# Patient Record
Sex: Female | Born: 1955 | Race: Black or African American | Hispanic: No | Marital: Married | Smoking: Never smoker
Health system: Southern US, Community
[De-identification: ages and names within clinical notes are randomized; demographics above are authoritative.]

## PROBLEM LIST (undated history)

## (undated) DIAGNOSIS — R748 Abnormal levels of other serum enzymes: Secondary | ICD-10-CM

## (undated) DIAGNOSIS — E221 Hyperprolactinemia: Secondary | ICD-10-CM

## (undated) DIAGNOSIS — N63 Unspecified lump in unspecified breast: Principal | ICD-10-CM

## (undated) DIAGNOSIS — R001 Bradycardia, unspecified: Secondary | ICD-10-CM

## (undated) DIAGNOSIS — K219 Gastro-esophageal reflux disease without esophagitis: Secondary | ICD-10-CM

## (undated) HISTORY — DX: Hyperprolactinemia: E22.1

## (undated) HISTORY — PX: BREAST EXCISIONAL BIOPSY: SUR124

## (undated) HISTORY — DX: Bradycardia, unspecified: R00.1

## (undated) HISTORY — DX: Unspecified lump in unspecified breast: N63.0

## (undated) HISTORY — PX: BUNIONECTOMY: SHX129

---

## 1994-04-24 HISTORY — PX: TUBAL LIGATION: SHX77

## 2001-07-17 ENCOUNTER — Other Ambulatory Visit: Admission: RE | Admit: 2001-07-17 | Discharge: 2001-07-17 | Payer: Self-pay | Admitting: Family Medicine

## 2004-04-29 ENCOUNTER — Ambulatory Visit: Payer: Self-pay | Admitting: Family Medicine

## 2006-08-02 ENCOUNTER — Ambulatory Visit: Payer: Self-pay | Admitting: Family Medicine

## 2006-08-30 ENCOUNTER — Ambulatory Visit (HOSPITAL_COMMUNITY): Admission: RE | Admit: 2006-08-30 | Discharge: 2006-08-30 | Payer: Self-pay | Admitting: Otolaryngology

## 2007-09-17 ENCOUNTER — Ambulatory Visit: Payer: Self-pay

## 2007-09-17 LAB — HM MAMMOGRAPHY: HM MAMMO: NORMAL

## 2007-10-30 ENCOUNTER — Encounter: Admission: RE | Admit: 2007-10-30 | Discharge: 2007-10-30 | Payer: Self-pay | Admitting: *Deleted

## 2007-11-22 ENCOUNTER — Encounter: Admission: RE | Admit: 2007-11-22 | Discharge: 2007-11-22 | Payer: Self-pay | Admitting: Surgery

## 2008-12-21 ENCOUNTER — Ambulatory Visit: Payer: Self-pay | Admitting: Unknown Physician Specialty

## 2010-02-14 ENCOUNTER — Encounter: Payer: Self-pay | Admitting: Cardiology

## 2010-06-23 ENCOUNTER — Encounter: Payer: Self-pay | Admitting: Cardiology

## 2010-06-23 ENCOUNTER — Emergency Department: Payer: Self-pay | Admitting: Emergency Medicine

## 2010-06-30 ENCOUNTER — Encounter: Payer: Self-pay | Admitting: Cardiology

## 2010-06-30 ENCOUNTER — Ambulatory Visit (INDEPENDENT_AMBULATORY_CARE_PROVIDER_SITE_OTHER): Payer: PRIVATE HEALTH INSURANCE | Admitting: Cardiology

## 2010-06-30 DIAGNOSIS — R9431 Abnormal electrocardiogram [ECG] [EKG]: Secondary | ICD-10-CM | POA: Insufficient documentation

## 2010-06-30 DIAGNOSIS — R011 Cardiac murmur, unspecified: Secondary | ICD-10-CM

## 2010-06-30 DIAGNOSIS — R0602 Shortness of breath: Secondary | ICD-10-CM | POA: Insufficient documentation

## 2010-06-30 DIAGNOSIS — R079 Chest pain, unspecified: Secondary | ICD-10-CM | POA: Insufficient documentation

## 2010-07-01 ENCOUNTER — Telehealth (INDEPENDENT_AMBULATORY_CARE_PROVIDER_SITE_OTHER): Payer: Self-pay | Admitting: *Deleted

## 2010-07-04 ENCOUNTER — Encounter: Payer: Self-pay | Admitting: Cardiology

## 2010-07-04 ENCOUNTER — Ambulatory Visit (HOSPITAL_COMMUNITY): Payer: PRIVATE HEALTH INSURANCE | Attending: Cardiology

## 2010-07-04 DIAGNOSIS — R0602 Shortness of breath: Secondary | ICD-10-CM

## 2010-07-04 DIAGNOSIS — R0609 Other forms of dyspnea: Secondary | ICD-10-CM

## 2010-07-04 DIAGNOSIS — R9431 Abnormal electrocardiogram [ECG] [EKG]: Secondary | ICD-10-CM | POA: Insufficient documentation

## 2010-07-04 DIAGNOSIS — R079 Chest pain, unspecified: Secondary | ICD-10-CM | POA: Insufficient documentation

## 2010-07-04 DIAGNOSIS — R0682 Tachypnea, not elsewhere classified: Secondary | ICD-10-CM | POA: Insufficient documentation

## 2010-07-04 DIAGNOSIS — R0989 Other specified symptoms and signs involving the circulatory and respiratory systems: Secondary | ICD-10-CM

## 2010-07-05 NOTE — Assessment & Plan Note (Signed)
Summary: [facility] f/u;Atypical CP/AMD   Visit Type:  Initial Consult Primary Provider:  Dr. Elease Hashimoto  CC:  Establish care; went to [facility] from her PCP with  abnormal EKG.   She has chest pain at rest with shortness of breath..  History of Present Illness: 55 yo with minimal past history presents for evaluation of chest pain.  Chest pain began about 10 days ago, initially while she was driving.  It felt like an aching in the central left chest.  It was not pleuritic.  On and off it radiated up the neck and down the left arm.   The pain resolved in about 30 minutes but came back periodically over the next 2 days, lasting about 15-30 minutes at a time.  The pain was never exertional.  About a week ago, she had a severe episode of the chest pain and noted shortness of breath climbing a flight of steps.  She went to her PCP's office where an ECG was done, which was abnormal, showing anterior shallow T wave inversions.  She was sent to the ER.  Workup in the ER was unremarkable with normal cardiac enzymes, normal D dimer, and normal CXR.  Since that time, she has had minimal chest pain.  She has only had brief (seconds in duration) twinges of pain.  She continues to exercise regularly with no problems. She was in a vigorous spin class 2 days ago with no exertional symptoms.  Of note, HR is 49 today.  She says that her pulse tends to run low and has done so for a long time (in the 50s typically).    ECG: NSR, HR 49, LAFB, inferior and anterior shallow T wave inversions  Labs (3/12, ER): D dimer normal, K 3.6, creatinine 0.87, troponin negative, HCT 36  Preventive Screening-Counseling & Management  Alcohol-Tobacco     Smoking Status: never  Caffeine-Diet-Exercise     Does Patient Exercise: yes      Drug Use:  no.    Current Medications (verified): 1)  Aspirin 81 Mg Tabs (Aspirin) .... Four Tablets Once Daily 2)  Fish Oil 1000 Mg Caps (Omega-3 Fatty Acids) .... One Tablet Once Daily 3)  Vitamin D3 2000  Unit Caps (Cholecalciferol) .... One Tablet Once Daily 4)  Centrum Silver  Tabs (Multiple Vitamins-Minerals) .... One Tablet Once Daily  Allergies (verified): No Known Drug Allergies  Past History:  Family History: Last updated: 2010-07-03 Father:Deceased; Diabetes Mother:Deceased; cancer & TIA's, also history of "angina" but no known MI or coronary procedure.  Grandmother: ? sudden cardiac death  Social History: Last updated: 07/03/10 Works as Agricultural engineer, also in school Married, husband is Engineer, civil (consulting) on 2000 floor at [employer] Tobacco Use - No.  Alcohol Use - no Regular Exercise - yes- works out 3 days weekly. Drug Use - no  Risk Factors: Exercise: yes (07-03-10)  Risk Factors: Smoking Status: never (2010/07/03)  Past Medical History: 1. Vitamin D deficiency 2. Tubal ligation 3.  Bradycardia (mild) 4.  Chest pain  Past Surgical History: tubligation  Family History: Father:Deceased; Diabetes Mother:Deceased; cancer & TIA's, also history of "angina" but no known MI or coronary procedure.  Grandmother: ? 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  Alcohol Use - no Regular Exercise - yes- works out 3 days weekly. Drug Use - no Smoking Status:  never Does Patient Exercise:  yes Drug Use:  no  Review of Systems       All systems reviewed and negative except as per HPI.   Vital Signs:  Patient profile:   55 year old female Height:      62 inches Weight:      187 pounds BMI:     34.33 Pulse rate:   49 / minute BP sitting:   112 / 79  (left arm) Cuff size:   regular  Vitals Entered By: Bishop Dublin, CMA (June 30, 2010 10:25 AM)  Physical Exam  General:  Well developed, well nourished, in no acute distress.  Overweight.  Head:  normocephalic and atraumatic Nose:  no deformity, discharge, inflammation, or lesions Mouth:  Teeth, gums  and palate normal. Oral mucosa normal. Neck:  Neck supple, no JVD. No masses, thyromegaly or abnormal cervical nodes. Lungs:  Clear bilaterally to auscultation and percussion. Heart:  Non-displaced PMI, chest non-tender; regular rate and rhythm, S1, S2 without rubs or gallops. 2/6 early systolic murmur RUSB.  Carotid upstroke normal, no bruit. Pedals normal pulses. No edema, no varicosities. Abdomen:  Bowel sounds positive; abdomen soft and non-tender without masses, organomegaly, or hernias noted. No hepatosplenomegaly. Extremities:  No clubbing or cyanosis. Neurologic:  Alert and oriented x 3. Skin:  Intact without lesions or rashes. Psych:  Normal affect.   Impression & Recommendations:  Problem # 1:  CHEST PAIN UNSPECIFIED (ICD-786.50)  Episodes of atypical chest pain, somewhat severe last week, now only fleeting.  Pain has been nonexertional.  Workup in the ER last week with normal CXR, D dimer, and cardiac enzymes.  She does, however, have an abnormal ECG with diffuse shallow T wave inversions.  Especially given the abnormal ECG, I will set her up for an ETT-myoview for risk stratification.  She can continue ASA 81 mg daily for now.   Her updated medication list for this problem includes:    Aspirin 81 Mg Tabs (Aspirin) .Marland Kitchen... Four tablets once daily  Problem # 2:  MURMUR (ICD-785.2) Systolic ejection-type murmur in the aortic area.  Will assess with echocardiogram.   Other Orders: Nuclear Stress Test (Nuc Stress Test) Echocardiogram (Echo)  Patient Instructions: 1)  Your physician recommends that you schedule a follow-up appointment in: 2 weeks 2)  Your physician has recommended you make the following change in your medication: DECREASE Aspirin to 81mg  once daily. 3)  Your physician has requested that you have an echocardiogram.  Echocardiography is a painless test that uses sound waves to create images of your heart. It provides your doctor with information about the size and  shape of your heart and how well your heart's chambers and valves are working.  This procedure takes approximately one hour. There are no restrictions for this procedure. 4)  Your physician has requested that you have an exercise stress myoview.  For further information please visit [email or web address]  Please follow instruction sheet, as given.

## 2010-07-05 NOTE — Progress Notes (Signed)
Summary: Nuclear Pre-Procedure  Phone Note Outgoing Call Call back at [facility] Phone [phone]   Call placed by: Stanton Kidney, EMT-P,  July 01, 2010 3:34 PM Summary of Call: Unable to leave message with information on Myoview Information Sheet (see scanned document for details); number not in service. Stanton Kidney, EMT-P  July 01, 2010 3:34 PM      Nuclear Med Background Indications for Stress Test: Evaluation for Ischemia, Post Hospital, Abnormal EKG  Indications Comments: Post Hosp [facility]: CP, ShOB, ABN EKG    Symptoms: Chest Pain, DOE, SOB    Nuclear Pre-Procedure Height (in): 62

## 2010-07-11 ENCOUNTER — Ambulatory Visit (INDEPENDENT_AMBULATORY_CARE_PROVIDER_SITE_OTHER): Payer: PRIVATE HEALTH INSURANCE | Admitting: Cardiology

## 2010-07-11 ENCOUNTER — Encounter: Payer: Self-pay | Admitting: Cardiology

## 2010-07-11 DIAGNOSIS — R079 Chest pain, unspecified: Secondary | ICD-10-CM

## 2010-07-12 NOTE — Assessment & Plan Note (Addendum)
Summary: Cardiology Nuclear Testing  Nuclear Med Background Indications for Stress Test: Evaluation for Ischemia, Post Hospital, Abnormal EKG  Indications Comments: Post Hosp [facility]: CP, ShOB, ABN EKG    Symptoms: Chest Pain, DOE, Palpitations, SOB    Nuclear Pre-Procedure Caffeine/Decaff Intake: none NPO After: 9:00 AM Lungs: clear IV 0.9% NS with Angio Cath: 22g     IV Site: R Wrist IV Started by: Cathlyn Parsons, RN Chest Size (in) 40     Cup Size D     Height (in): 62 Weight (lb): 184 BMI: 33.78  Nuclear Med Study 1 or 2 day study:  1 day     Stress Test Type:  Stress Reading MD:  Olga Millers, MD     Referring MD:  D.McLean Resting Radionuclide:  Technetium 47m Tetrofosmin     Resting Radionuclide Dose:  11.0 mCi  Stress Radionuclide:  Technetium 29m Tetrofosmin     Stress Radionuclide Dose:  33.0 mCi   Stress Protocol Exercise Time (min):  10:01 min     Max HR:  155 bpm     Predicted Max HR:  167 bpm  Max Systolic BP: 174 mm Hg     Percent Max HR:  92.81 %     METS: 11.7 Rate Pressure Product:  04540    Stress Test Technologist:  Milana Na, EMT-P     Nuclear Technologist:  Doyne Keel, CNMT  Rest Procedure  Myocardial perfusion imaging was performed at rest 45 minutes following the intravenous administration of Technetium 36m Tetrofosmin.  Stress Procedure  The patient exercised for 10:01. The patient stopped due to fatigue, sob, and denied any chest pain.  There were non specific ST-T wave changes.  Technetium 91m Tetrofosmin was injected at peak exercise and myocardial perfusion imaging was performed after a brief delay.  QPS Raw Data Images:  There is a breast shadow that accounts for the anterior attenuation. Stress Images:  There is decreased uptake in the distal anterior wall and apex. Rest Images:  There is decreased uptake in the distal anterior wall and apex. Subtraction (SDS):  No evidence of ischemia. Transient Ischemic Dilatation:  1.01   (Normal <1.22)  Lung/Heart Ratio:  0.42  (Normal <0.45)  Quantitative Gated Spect Images QGS EDV:  67 ml QGS ESV:  20 ml QGS EF:  70 % QGS cine images:  Normal wall motion.   Overall Impression  Exercise Capacity: Good exercise capacity. BP Response: Normal blood pressure response. Clinical Symptoms: There is dyspnea. ECG Impression: No significant ST segment change suggestive of ischemia. Overall Impression: Normal stress nuclear study with breast attenuation but no ischemia or infarction.  Appended Document: Cardiology Nuclear Testing normal myoview with some breast attenuation.   Appended Document: Cardiology Nuclear Testing pt notified at [facility] today/sab

## 2010-07-19 ENCOUNTER — Other Ambulatory Visit (INDEPENDENT_AMBULATORY_CARE_PROVIDER_SITE_OTHER): Payer: PRIVATE HEALTH INSURANCE | Admitting: *Deleted

## 2010-07-19 ENCOUNTER — Other Ambulatory Visit: Payer: Self-pay | Admitting: Cardiology

## 2010-07-19 DIAGNOSIS — R011 Cardiac murmur, unspecified: Secondary | ICD-10-CM

## 2010-07-21 NOTE — Assessment & Plan Note (Signed)
Summary: 2 wk f/u/sab   Visit Type:  Follow-up Primary Provider:  Dr. Elease Hashimoto  CC:  c/o occasional chest pains. denies SOB.Krystal Woodward  History of Present Illness: 55 yo with minimal past history presented initially for evaluation of chest pain.  She has episodes of atypical, nonexertional chest pain.  She was evaluated once in the ER.  She exercises frequently with no exertional symptoms.  We did an ETT-myoview, which showed good exercise tolerance, EF 70%, and no evidence for ischemia or infarction. She will get an echo to evaluate her murmur next week.  She is still have chest pain episodes. Some are related to meals and could be due to reflux.    ECG: NSR, rSR' in V1, anterior nonspecific T wave flattening.   Labs (3/12, ER): D dimer normal, K 3.6, creatinine 0.87, troponin negative, HCT 36  Current Medications (verified): 1)  Aspirin 81 Mg Tabs (Aspirin) .Krystal Woodward.. 1 Tablet Once Daily 2)  Fish Oil Triple Strength 1360 Mg Caps (Omega-3 Fatty Acids) .Krystal Woodward.. 1 Tablet Once Daily 3)  Vitamin D3 2000 Unit Caps (Cholecalciferol) .... One Tablet Once Daily 4)  Centrum Silver  Tabs (Multiple Vitamins-Minerals) .... One Tablet Once Daily  Allergies (verified): No Known Drug Allergies  Past History:  Past Surgical History: Last updated: 2010-07-06 tubligation  Family History: Last updated: 07-06-2010 Father:Deceased; Diabetes Mother:Deceased; cancer & TIA's, also history of "angina" but no known MI or coronary procedure.  Grandmother: ? 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  Alcohol Use - no Regular Exercise - yes- works out 3 days weekly. Drug Use - no  Risk Factors: Exercise: yes (07-06-10)  Risk Factors: Smoking Status: never (06-Jul-2010)  Past Medical History: 1. Vitamin D deficiency 2. Tubal ligation 3.  Bradycardia (mild) 4.  Chest pain:  ETT-myoview (3/12) with 10 minutes exercise, EF 70%, some breast attenuation but no evidence for ischemia or infarction.   Family History: Reviewed history from 07/06/2010 and no changes required. Father:Deceased; Diabetes Mother:Deceased; cancer & TIA's, also history of "angina" but no known MI or coronary procedure.  Grandmother: ? sudden cardiac death  Social History: Reviewed history from 07/06/2010 and no changes required. 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  Alcohol Use - no Regular Exercise - yes- works out 3 days weekly. Drug Use - no  Vital Signs:  Patient profile:   55 year old female Height:      62 inches Weight:      182 pounds BMI:     33.41 Pulse rate:   50 / minute BP sitting:   112 / 72  (left arm) Cuff size:   regular  Vitals Entered By: Lysbeth Galas CMA (July 11, 2010 2:47 PM)  Physical Exam  General:  Well developed, well nourished, in no acute distress. Neck:  Neck supple, no JVD. No masses, thyromegaly or abnormal cervical nodes. Lungs:  Clear bilaterally to auscultation and percussion. Heart:  Non-displaced PMI, chest non-tender; regular rate and rhythm, S1, S2 without rubs or gallops. 2/6 early systolic murmur RUSB.  Carotid upstroke normal, no bruit. Pedals normal pulses. No edema, no varicosities. Abdomen:  Bowel sounds positive; abdomen soft and non-tender without masses, organomegaly, or hernias noted. No hepatosplenomegaly. Extremities:  No clubbing or cyanosis. Neurologic:  Alert and oriented x 3. Psych:  Normal affect.   Impression & Recommendations:  Problem # 1:  CHEST PAIN UNSPECIFIED (ICD-786.50) Atypical chest pain with normal myoview.  It is possible that her symptoms may be due to GERD.  I have asked her to take omeprazole daily to see if there is any resolution.    Problem # 2:  MURMUR (ICD-785.2) Awaiting echo to evaluate, will be done next week.    Other Orders: EKG w/ Interpretation (93000)

## 2010-08-08 ENCOUNTER — Telehealth: Payer: Self-pay | Admitting: *Deleted

## 2010-08-08 NOTE — Telephone Encounter (Signed)
LMOM TCB. Notify pt that her recent echo showed no significant abnormalities, per Dr. Shirlee Latch.

## 2010-08-10 ENCOUNTER — Encounter: Payer: Self-pay | Admitting: *Deleted

## 2010-08-10 NOTE — Telephone Encounter (Signed)
Pt.notified

## 2010-08-11 ENCOUNTER — Encounter: Payer: Self-pay | Admitting: Cardiology

## 2011-01-16 ENCOUNTER — Other Ambulatory Visit (INDEPENDENT_AMBULATORY_CARE_PROVIDER_SITE_OTHER): Payer: Self-pay | Admitting: Surgery

## 2011-01-18 ENCOUNTER — Other Ambulatory Visit: Payer: Self-pay | Admitting: Family Medicine

## 2011-01-18 DIAGNOSIS — N644 Mastodynia: Secondary | ICD-10-CM

## 2011-01-24 ENCOUNTER — Ambulatory Visit
Admission: RE | Admit: 2011-01-24 | Discharge: 2011-01-24 | Disposition: A | Discharge status: Home / Self Care | Payer: PRIVATE HEALTH INSURANCE | Source: Ambulatory Visit | Attending: Family Medicine | Admitting: Family Medicine

## 2011-01-24 ENCOUNTER — Other Ambulatory Visit: Payer: Self-pay | Admitting: Family Medicine

## 2011-01-24 DIAGNOSIS — N644 Mastodynia: Secondary | ICD-10-CM

## 2011-01-24 DIAGNOSIS — N63 Unspecified lump in unspecified breast: Secondary | ICD-10-CM

## 2011-01-24 HISTORY — DX: Unspecified lump in unspecified breast: N63.0

## 2011-01-27 ENCOUNTER — Other Ambulatory Visit: Payer: PRIVATE HEALTH INSURANCE

## 2011-02-23 ENCOUNTER — Other Ambulatory Visit (INDEPENDENT_AMBULATORY_CARE_PROVIDER_SITE_OTHER): Payer: Self-pay | Admitting: Surgery

## 2011-02-23 ENCOUNTER — Ambulatory Visit (INDEPENDENT_AMBULATORY_CARE_PROVIDER_SITE_OTHER): Payer: PRIVATE HEALTH INSURANCE | Admitting: Surgery

## 2011-02-23 ENCOUNTER — Encounter (INDEPENDENT_AMBULATORY_CARE_PROVIDER_SITE_OTHER): Payer: Self-pay | Admitting: Surgery

## 2011-02-23 VITALS — BP 122/82 | HR 60 | Temp 97.6°F | Resp 20 | Ht 62.0 in | Wt 181.0 lb

## 2011-02-23 DIAGNOSIS — N63 Unspecified lump in unspecified breast: Secondary | ICD-10-CM

## 2011-02-23 NOTE — Patient Instructions (Signed)
We will arrange for surgery to remove the area in the left breast. YOu will go to the breast center for them to put a wire to the area of your breast that is abnormal

## 2011-02-23 NOTE — Progress Notes (Signed)
Chief Complaint  Patient presents with  . Breast Mass    left    HPI Junita Kubota is a 55 y.o. female.  The patient went in for some evaluation because of some discomfort in the upper outer quadrant of her left breast.  In 2009 she had a left breast abscess in the same area. She was treated with antibiotics for a while and then had a few aspirations. There is no long-term residual problem after that.  The area she had question in the left upper outer quadrant is fairly high. When she had a mammogram and ultrasound serial normal but incidentally noted were some dilated ducts in the circumareolar area on the left 1:00 position. 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 She's had no nipple discharge. It was suggested that she have this area surgically excised. She came here for evaluation. HPI  Past Medical History  Diagnosis Date  . Vitamin D deficiency   . Bradycardia     mild  . Chest pain   . Breast mass 01/24/2011    Past Surgical History  Procedure Date  . Tubal ligation 16 years ago    Family History  Problem Relation Age of Onset  . Cancer Mother     lymph nodes  . Transient ischemic attack Mother   . Diabetes Father     Social History History  Substance Use Topics  . Smoking status: Never Smoker   . Smokeless tobacco: Never Used  . Alcohol Use: No    No Known Allergies  Current Outpatient Prescriptions  Medication Sig Dispense Refill  . aspirin 81 MG EC tablet Take 324 mg by mouth daily.        . Cholecalciferol (D-3-5) 5000 UNITS capsule Take 5,000 Units by mouth daily.        . Multiple Vitamins-Minerals (CENTRUM SILVER ULTRA WOMENS PO) Take 1 capsule by mouth daily.        . Omega-3 Fatty Acids (FISH OIL PO) Take 1,500 Units by mouth.          Review of Systems Review of Systems  Constitutional: Negative for fever, chills and unexpected weight change.  HENT: Negative for  hearing loss, congestion, sore throat, trouble swallowing and voice change.   Eyes: Negative for visual disturbance.  Respiratory: Negative for cough and wheezing.   Cardiovascular: Negative for chest pain, palpitations and leg swelling.  Gastrointestinal: Negative for nausea, vomiting, abdominal pain, diarrhea, constipation, blood in stool, abdominal distention and anal bleeding.  Genitourinary: Negative for hematuria, vaginal bleeding and difficulty urinating.  Musculoskeletal: Negative for arthralgias.  Skin: Negative for rash and wound.  Neurological: Negative for seizures, syncope and headaches.  Hematological: Negative for adenopathy. Does not bruise/bleed easily.  Psychiatric/Behavioral: Negative for confusion.    Blood pressure 122/82, pulse 60, temperature 97.6 F (36.4 C), temperature source Temporal, resp. rate 20, height 5\' 2"  (1.575 m), weight 181 lb (82.101 kg).  Physical Exam Physical Exam  Constitutional: She appears well-developed and well-nourished. No distress.  HENT:  Head: Normocephalic.  Eyes: Right eye exhibits no discharge. Left eye exhibits no discharge. No scleral icterus.  Neck: Normal range of motion. Neck supple. No JVD present. No tracheal deviation present. No thyromegaly present.  Cardiovascular: Normal rate, regular rhythm, normal heart sounds and intact distal pulses.  Exam reveals no friction rub.   No murmur heard. Pulmonary/Chest: Effort normal and breath sounds normal. No respiratory distress.  She has no wheezes. She has no rales. She exhibits no tenderness.  Abdominal: Soft. She exhibits no distension and no mass. There is no tenderness. There is no rebound and no guarding.  Musculoskeletal: Normal range of motion. She exhibits no edema and no tenderness.  Neurological: She is alert. She has normal reflexes.  Skin: Skin is warm and dry. No rash noted. She is not diaphoretic. No erythema. No pallor.  Psychiatric: She has a normal mood and affect.  Her behavior is normal. Judgment and thought content normal.  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 She understand that the chance of removal of the abnormal area is very good, but that occasionally we are unable to locate it and may have to do a second procedure. We also discussed the possibility of a second procedure to get additional tissue. Risks of surgery such as bleeding and infection have also been explained, as well as the implications of not doing the surgery. She understands and wishes to proceed.        Johnasia Liese J 02/23/2011, 10:51 AM

## 2011-02-24 ENCOUNTER — Encounter (HOSPITAL_BASED_OUTPATIENT_CLINIC_OR_DEPARTMENT_OTHER): Payer: Self-pay | Admitting: *Deleted

## 2011-02-26 NOTE — H&P (Signed)
 Krystal Woodward   02/23/2011 9:50 AM Office Visit  MRN: [id number]   Description: 55 year old female  Provider: Brett Soza J, MD  Department: [facility]        Diagnoses     Breast mass   - Primary    611.72      Reason for Visit     Breast Mass    left        Vitals - Last Recorded       BP Pulse Temp(Src) Resp Ht Wt    122/82  60  97.6 F (36.4 C) (Temporal)  20  5' 2" (1.575 m)  181 lb (82.101 kg)          BMI              33.11 kg/m2                 Progress Notes     Nanette Wirsing J, MD  02/23/2011 11:05 AM  SignedChief Complaint   Patient presents with   .  Breast Mass       left      HPI Krystal Woodward is a 55 y.o. female.  The patient went in for some evaluation because of some discomfort in the upper outer quadrant of her left breast.   In 2009 she had a left breast abscess in the same area. She was treated with antibiotics for a while and then had a few aspirations. There is no long-term residual problem after that.   The area she had question in the left upper outer quadrant is fairly high. When she had a mammogram and ultrasound serial normal but incidentally noted were some dilated ducts in the circumareolar area on the left 1:00 position. 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 She's had no nipple discharge. It was suggested that she have this area surgically excised. She came here for evaluation. HPI    Past Medical History   Diagnosis  Date   .  Vitamin D deficiency     .  Bradycardia         mild   .  Chest pain     .  Breast mass  01/24/2011       Past Surgical History   Procedure  Date   .  Tubal ligation  16 years ago       Family History   Problem  Relation  Age of Onset   .  Cancer  Mother         lymph nodes   .  Transient ischemic attack  Mother     .  Diabetes  Father        Social History History   Substance Use Topics   .   Smoking status:  Never Smoker    .  Smokeless tobacco:  Never Used   .  Alcohol Use:  No      No Known Allergies    Current Outpatient Prescriptions   Medication  Sig  Dispense  Refill   .  aspirin 81 MG EC tablet  Take 324 mg by mouth daily.           .  Cholecalciferol (D-3-5) 5000 UNITS capsule  Take 5,000 Units by mouth daily.           .  Multiple Vitamins-Minerals (CENTRUM SILVER ULTRA WOMENS PO)  Take 1 capsule by   mouth daily.           .  Omega-3 Fatty Acids (FISH OIL PO)  Take 1,500 Units by mouth.              Review of Systems Review of Systems  Constitutional: Negative for fever, chills and unexpected weight change.  HENT: Negative for hearing loss, congestion, sore throat, trouble swallowing and voice change.   Eyes: Negative for visual disturbance.  Respiratory: Negative for cough and wheezing.   Cardiovascular: Negative for chest pain, palpitations and leg swelling.  Gastrointestinal: Negative for nausea, vomiting, abdominal pain, diarrhea, constipation, blood in stool, abdominal distention and anal bleeding.  Genitourinary: Negative for hematuria, vaginal bleeding and difficulty urinating.  Musculoskeletal: Negative for arthralgias.  Skin: Negative for rash and wound.  Neurological: Negative for seizures, syncope and headaches.  Hematological: Negative for adenopathy. Does not bruise/bleed easily.  Psychiatric/Behavioral: Negative for confusion.    Blood pressure 122/82, pulse 60, temperature 97.6 F (36.4 C), temperature source Temporal, resp. rate 20, height 5' 2" (1.575 m), weight 181 lb (82.101 kg).   Physical Exam Physical Exam  Constitutional: She appears well-developed and well-nourished. No distress.  HENT:   Head: Normocephalic.  Eyes: Right eye exhibits no discharge. Left eye exhibits no discharge. No scleral icterus.  Neck: Normal range of motion. Neck supple. No JVD present. No tracheal deviation present. No thyromegaly present.  Cardiovascular:  Normal rate, regular rhythm, normal heart sounds and intact distal pulses.  Exam reveals no friction rub.    No murmur heard. Pulmonary/Chest: Effort normal and breath sounds normal. No respiratory distress. She has no wheezes. She has no rales. She exhibits no tenderness.  Abdominal: Soft. She exhibits no distension and no mass. There is no tenderness. There is no rebound and no guarding.  Musculoskeletal: Normal range of motion. She exhibits no edema and no tenderness.  Neurological: She is alert. She has normal reflexes.  Skin: Skin is warm and dry. No rash noted. She is not diaphoretic. No erythema. No pallor.  Psychiatric: She has a normal mood and affect. Her behavior is normal. Judgment and thought content normal.  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 She understand that the chance of removal of the abnormal area is very good, but that occasionally we are unable to locate it and may have to do a second procedure. We also discussed the possibility of a second procedure to get additional tissue. Risks of surgery such as bleeding and infection have also been explained, as well as the implications of not doing the surgery. She understands and wishes to proceed.         Marien Manship J 02/23/2011, 10:51 AM                

## 2011-02-27 ENCOUNTER — Ambulatory Visit (HOSPITAL_BASED_OUTPATIENT_CLINIC_OR_DEPARTMENT_OTHER): Payer: PRIVATE HEALTH INSURANCE | Admitting: Certified Registered Nurse Anesthetist

## 2011-02-27 ENCOUNTER — Ambulatory Visit
Admission: RE | Admit: 2011-02-27 | Discharge: 2011-02-27 | Disposition: A | Discharge status: Home / Self Care | Payer: PRIVATE HEALTH INSURANCE | Source: Ambulatory Visit | Attending: Surgery | Admitting: Surgery

## 2011-02-27 ENCOUNTER — Encounter (HOSPITAL_BASED_OUTPATIENT_CLINIC_OR_DEPARTMENT_OTHER): Payer: Self-pay | Admitting: *Deleted

## 2011-02-27 ENCOUNTER — Ambulatory Visit (HOSPITAL_BASED_OUTPATIENT_CLINIC_OR_DEPARTMENT_OTHER)
Admission: RE | Admit: 2011-02-27 | Discharge: 2011-02-27 | Disposition: A | Discharge status: Home / Self Care | Payer: PRIVATE HEALTH INSURANCE | Source: Ambulatory Visit | Attending: Surgery | Admitting: Surgery

## 2011-02-27 ENCOUNTER — Other Ambulatory Visit (INDEPENDENT_AMBULATORY_CARE_PROVIDER_SITE_OTHER): Payer: Self-pay | Admitting: Surgery

## 2011-02-27 ENCOUNTER — Encounter (HOSPITAL_BASED_OUTPATIENT_CLINIC_OR_DEPARTMENT_OTHER): Payer: Self-pay | Admitting: Certified Registered Nurse Anesthetist

## 2011-02-27 ENCOUNTER — Encounter (HOSPITAL_BASED_OUTPATIENT_CLINIC_OR_DEPARTMENT_OTHER)
Admission: RE | Disposition: A | Discharge status: Home / Self Care | Payer: Self-pay | Source: Ambulatory Visit | Attending: Surgery

## 2011-02-27 DIAGNOSIS — N63 Unspecified lump in unspecified breast: Secondary | ICD-10-CM

## 2011-02-27 DIAGNOSIS — K219 Gastro-esophageal reflux disease without esophagitis: Secondary | ICD-10-CM | POA: Insufficient documentation

## 2011-02-27 DIAGNOSIS — N6049 Mammary duct ectasia of unspecified breast: Secondary | ICD-10-CM | POA: Insufficient documentation

## 2011-02-27 DIAGNOSIS — N641 Fat necrosis of breast: Secondary | ICD-10-CM

## 2011-02-27 HISTORY — DX: Gastro-esophageal reflux disease without esophagitis: K21.9

## 2011-02-27 HISTORY — PX: BREAST CYST EXCISION: SHX579

## 2011-02-27 SURGERY — EXCISION, CYST, BREAST
Anesthesia: General | Laterality: Left

## 2011-02-27 MED ORDER — MIDAZOLAM HCL 2 MG/2ML IJ SOLN: 0.5000 mg | INTRAMUSCULAR | Status: DC | PRN

## 2011-02-27 MED ORDER — LACTATED RINGERS IV SOLN
INTRAVENOUS | Status: DC | PRN
  Administered 2011-02-27 (×2): via INTRAVENOUS

## 2011-02-27 MED ORDER — MIDAZOLAM HCL 5 MG/5ML IJ SOLN
INTRAMUSCULAR | Status: DC | PRN
  Administered 2011-02-27: 2 mg via INTRAVENOUS

## 2011-02-27 MED ORDER — LACTATED RINGERS IV SOLN: 500.0000 mL | INTRAVENOUS | Status: DC

## 2011-02-27 MED ORDER — LACTATED RINGERS IV SOLN: INTRAVENOUS | Status: DC

## 2011-02-27 MED ORDER — LACTATED RINGERS IV SOLN
INTRAVENOUS | Status: DC
  Administered 2011-02-27 (×2): via INTRAVENOUS

## 2011-02-27 MED ORDER — FENTANYL CITRATE 0.05 MG/ML IJ SOLN
INTRAMUSCULAR | Status: DC | PRN
  Administered 2011-02-27 (×2): 50 ug via INTRAVENOUS

## 2011-02-27 MED ORDER — HYDROMORPHONE HCL 2 MG PO TABS
2.0000 mg | ORAL_TABLET | Freq: Once | ORAL | Status: AC
  Administered 2011-02-27: 2 mg via ORAL

## 2011-02-27 MED ORDER — MIDAZOLAM HCL 2 MG/ML PO SYRP: 0.5000 mg/kg | ORAL_SOLUTION | Freq: Once | ORAL | Status: DC

## 2011-02-27 MED ORDER — CEFAZOLIN SODIUM-DEXTROSE 2-3 GM-% IV SOLR
2.0000 g | INTRAVENOUS | Status: AC
  Administered 2011-02-27: 2 g via INTRAVENOUS

## 2011-02-27 MED ORDER — OXYMETAZOLINE HCL 0.05 % NA SOLN: 2.0000 | Freq: Once | NASAL | Status: DC

## 2011-02-27 MED ORDER — BUPIVACAINE HCL (PF) 0.25 % IJ SOLN
INTRAMUSCULAR | Status: DC | PRN
  Administered 2011-02-27: 30 mL

## 2011-02-27 MED ORDER — ACETAMINOPHEN 10 MG/ML IV SOLN
1000.0000 mg | Freq: Once | INTRAVENOUS | Status: AC
  Administered 2011-02-27: 1000 mg via INTRAVENOUS

## 2011-02-27 MED ORDER — DEXAMETHASONE SODIUM PHOSPHATE 4 MG/ML IJ SOLN
INTRAMUSCULAR | Status: DC | PRN
  Administered 2011-02-27: 10 mg via INTRAVENOUS

## 2011-02-27 MED ORDER — ATROPINE SULFATE 0.4 MG/ML IJ SOLN
0.4000 mg | Freq: Once | INTRAMUSCULAR | Status: AC | PRN
  Administered 2011-02-27: 0.4 mg via INTRAVENOUS

## 2011-02-27 MED ORDER — GLYCOPYRROLATE 0.2 MG/ML IJ SOLN: 0.2000 mg | Freq: Once | INTRAMUSCULAR | Status: DC | PRN

## 2011-02-27 MED ORDER — MIDAZOLAM HCL 2 MG/2ML IJ SOLN: 1.0000 mg | INTRAMUSCULAR | Status: DC | PRN

## 2011-02-27 MED ORDER — PROPOFOL 10 MG/ML IV EMUL
INTRAVENOUS | Status: DC | PRN
  Administered 2011-02-27: 200 mg via INTRAVENOUS

## 2011-02-27 MED ORDER — DROPERIDOL 2.5 MG/ML IJ SOLN
INTRAMUSCULAR | Status: DC | PRN
  Administered 2011-02-27: 0.625 mg via INTRAVENOUS

## 2011-02-27 MED ORDER — ONDANSETRON HCL 4 MG/2ML IJ SOLN
INTRAMUSCULAR | Status: DC | PRN
  Administered 2011-02-27: 4 mg via INTRAVENOUS

## 2011-02-27 MED ORDER — LIDOCAINE-PRILOCAINE 2.5-2.5 % EX CREA: 1.0000 "application " | TOPICAL_CREAM | Freq: Once | CUTANEOUS | Status: DC

## 2011-02-27 SURGICAL SUPPLY — 37 items
ADH SKN CLS APL DERMABOND .7 (GAUZE/BANDAGES/DRESSINGS) ×1
BINDER BREAST XLRG (GAUZE/BANDAGES/DRESSINGS) ×1 IMPLANT
BLADE HEX COATED 2.75 (ELECTRODE) ×1 IMPLANT
BLADE SURG 15 STRL LF DISP TIS (BLADE) IMPLANT
BLADE SURG 15 STRL SS (BLADE) ×2
CHLORAPREP W/TINT 26ML (MISCELLANEOUS) ×1 IMPLANT
CLOTH BEACON ORANGE TIMEOUT ST (SAFETY) ×1 IMPLANT
COVER MAYO STAND STRL (DRAPES) ×1 IMPLANT
COVER TABLE BACK 60X90 (DRAPES) ×1 IMPLANT
DERMABOND ADVANCED (GAUZE/BANDAGES/DRESSINGS) ×1
DERMABOND ADVANCED .7 DNX12 (GAUZE/BANDAGES/DRESSINGS) IMPLANT
DEVICE DUBIN W/COMP PLATE 8390 (MISCELLANEOUS) ×1 IMPLANT
DRAPE LAPAROTOMY TRNSV 102X78 (DRAPE) ×1 IMPLANT
DRAPE UTILITY XL STRL (DRAPES) ×1 IMPLANT
ELECT REM PT RETURN 9FT ADLT (ELECTROSURGICAL) ×2
ELECTRODE REM PT RTRN 9FT ADLT (ELECTROSURGICAL) IMPLANT
GLOVE BIO SURGEON STRL SZ 6.5 (GLOVE) ×1 IMPLANT
GLOVE BIOGEL PI IND STRL 7.0 (GLOVE) IMPLANT
GLOVE BIOGEL PI INDICATOR 7.0 (GLOVE) ×1
GLOVE EUDERMIC 7 POWDERFREE (GLOVE) ×1 IMPLANT
GOWN PREVENTION PLUS XLARGE (GOWN DISPOSABLE) ×1 IMPLANT
GOWN PREVENTION PLUS XXLARGE (GOWN DISPOSABLE) ×1 IMPLANT
NDL HYPO 25X1 1.5 SAFETY (NEEDLE) IMPLANT
NEEDLE HYPO 25X1 1.5 SAFETY (NEEDLE) ×2 IMPLANT
NS IRRIG 1000ML POUR BTL (IV SOLUTION) ×1 IMPLANT
PACK BASIN DAY SURGERY FS (CUSTOM PROCEDURE TRAY) ×1 IMPLANT
PENCIL BUTTON HOLSTER BLD 10FT (ELECTRODE) ×1 IMPLANT
SLEEVE SCD COMPRESS KNEE MED (MISCELLANEOUS) ×1 IMPLANT
SLEEVE SURGEON STRL (DRAPES) ×1 IMPLANT
SPONGE GAUZE 4X4 12PLY (GAUZE/BANDAGES/DRESSINGS) ×1 IMPLANT
SPONGE LAP 4X18 X RAY DECT (DISPOSABLE) ×2 IMPLANT
SUT MNCRL AB 4-0 PS2 18 (SUTURE) ×1 IMPLANT
SUT VICRYL 3-0 CR8 SH (SUTURE) ×1 IMPLANT
SYR CONTROL 10ML LL (SYRINGE) ×1 IMPLANT
TOWEL OR NON WOVEN STRL DISP B (DISPOSABLE) ×1 IMPLANT
TUBE CONNECTING 20X1/4 (TUBING) ×1 IMPLANT
YANKAUER SUCT BULB TIP NO VENT (SUCTIONS) ×1 IMPLANT

## 2011-02-27 NOTE — Procedures (Signed)
Krystal Woodward [id number] 02/27/2011 DOB; May 30, 1955  Preoperative diagnosis: Left breast mass subareolar Postoperative diagnosis: Same-severe fibrocystic changes Procedure: Needle guided breast biopsy Surgeon:Saxton Chain J  Anesthesia: Gen. Clinical history: This patient has a remote history of a left breast abscess that took care of. She recently presented with some breast pain and had a mammogram and ultrasound done. The area of her discomfort was unremarkable but a complex area of subareolar was noted and a biopsy done. This is suggestive of a papilloma and excisional biopsy was recommended.  Procedure: I saw the patient in preoperative area and marked the left breast as the operative site. I reviewed the localizing films. I reviewed the procedure with the patient and her family in nature there were no other questions.  The patient was then taken to the operating room and after satisfactory general anesthesia had been obtained the left breast was prepped and draped in a time out was done.  The guidewire entered just above and medial to the nipple but within the areola. I made a circumareolar incision at the 12:00 position. I raised a thin skin flap and-like manipulate the guidewire into the wound and these cautery to take out the tissue around the guidewire. It was fairly superficial. I encountered multiple cysts fills mainly with a light yellow creamy material. Another cyst that was close by was more of a brownish material. I tried to excise much of this but there were several sips going fairly deep. It took several pieces of tissue and we did specimen mammograms but I could not localize the guide clip. I spoke with the radiologist and the clip was fairly superficial and I had taken tissue from just under the dermis. There were 2 small skin openings above the areola I may trying to get very close to the dermis to be sure we had always tissue excised.  I then irrigated made sure everything  was dry. I put several deep sutures of 3-0 Vicryl and then closed the skin with 4-0 Monocryl subcuticular and Dermabond. Sterile dressings were applied.  The patient tolerated the procedure well. There no operative complications. All counts were  correct.

## 2011-02-27 NOTE — Anesthesia Postprocedure Evaluation (Signed)
  Anesthesia Post-op Note  Patient: Krystal Woodward  Procedure(s) Performed:  CYST EXCISION BREAST - Needle localization removal left breast mass  Patient Location: PACU  Anesthesia Type: General  Level of Consciousness: awake  Airway and Oxygen Therapy: Patient Spontanous Breathing  Post-op Pain: mild  Post-op Assessment: Post-op Vital signs reviewed, Patient's Cardiovascular Status Stable, Respiratory Function Stable, Patent Airway, No signs of Nausea or vomiting, Adequate PO intake and Pain level controlled  Post-op Vital Signs: Reviewed and stable  Complications: No apparent anesthesia complications

## 2011-02-27 NOTE — Discharge Summary (Signed)
Pt sent husband away for lunch @ 1515 just when we were going to give him post-op instructions.

## 2011-02-27 NOTE — Interval H&P Note (Signed)
The patient was seen and examined. No changes from the H&P. I reviewed the plans for the procedure and she has no more questions. The NL films are reviewed

## 2011-02-27 NOTE — Anesthesia Preprocedure Evaluation (Addendum)
Anesthesia Evaluation  Patient identified by MRN, date of birth, ID band Patient awake    Reviewed: Allergy & Precautions, H&P , NPO status , Patient's Chart, lab work & pertinent test results, reviewed documented beta blocker date and time   Airway Mallampati: II TM Distance: >3 FB Neck ROM: full    Dental No notable dental hx.    Pulmonary shortness of breath and with exertion,    Pulmonary exam normal       Cardiovascular + Valvular Problems/Murmurs     Neuro/Psych Negative Neurological ROS  Negative Psych ROS   GI/Hepatic Neg liver ROS, GERD-  ,  Endo/Other  Negative Endocrine ROS  Renal/GU negative Renal ROS  Genitourinary negative   Musculoskeletal   Abdominal   Peds  Hematology negative hematology ROS (+)   Anesthesia Other Findings   Reproductive/Obstetrics negative OB ROS                           Anesthesia Physical Anesthesia Plan  ASA: II  Anesthesia Plan: General   Post-op Pain Management:    Induction: Intravenous  Airway Management Planned: LMA  Additional Equipment:   Intra-op Plan:   Post-operative Plan: Extubation in OR  Informed Consent: I have reviewed the patients History and Physical, chart, labs and discussed the procedure including the risks, benefits and alternatives for the proposed anesthesia with the patient or authorized representative who has indicated his/her understanding and acceptance.   Dental Advisory Given  Plan Discussed with: CRNA and Surgeon  Anesthesia Plan Comments:        Anesthesia Quick Evaluation

## 2011-02-27 NOTE — Anesthesia Procedure Notes (Signed)
Procedure Name: LMA Insertion Date/Time: 02/27/2011 11:34 AM Performed by: Meyer Russel Pre-anesthesia Checklist: Patient identified, Timeout performed, Emergency Drugs available, Suction available and Patient being monitored Patient Re-evaluated:Patient Re-evaluated prior to inductionOxygen Delivery Method: Circle System Utilized Preoxygenation: Pre-oxygenation with 100% oxygen Intubation Type: IV induction Ventilation: Mask ventilation without difficulty LMA: LMA inserted LMA Size: 4.0 Number of attempts: 1 Tube secured with: Tape Dental Injury: Teeth and Oropharynx as per pre-operative assessment

## 2011-02-27 NOTE — H&P (View-Only) (Signed)
Krystal Woodward   02/23/2011 9:50 AM Office Visit  MRN: [id number]   Description: 55 year old female  Provider: Currie Paris, MD  Department: [facility]        Diagnoses     Breast mass   - Primary    611.72      Reason for Visit     Breast Mass    left        Vitals - Last Recorded       BP Pulse Temp(Src) Resp Ht Wt    122/82  60  97.6 F (36.4 C) (Temporal)  20  5\' 2"  (1.575 m)  181 lb (82.101 kg)          BMI              33.11 kg/m2                 Progress Notes     Currie Paris, MD  02/23/2011 11:05 AM  SignedChief Complaint   Patient presents with   .  Breast Mass       left      HPI Krystal Woodward is a 55 y.o. female.  The patient went in for some evaluation because of some discomfort in the upper outer quadrant of her left breast.   In 2009 she had a left breast abscess in the same area. She was treated with antibiotics for a while and then had a few aspirations. There is no long-term residual problem after that.   The area she had question in the left upper outer quadrant is fairly high. When she had a mammogram and ultrasound serial normal but incidentally noted were some dilated ducts in the circumareolar area on the left 1:00 position. 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 She's had no nipple discharge. It was suggested that she have this area surgically excised. She came here for evaluation. HPI    Past Medical History   Diagnosis  Date   .  Vitamin D deficiency     .  Bradycardia         mild   .  Chest pain     .  Breast mass  01/24/2011       Past Surgical History   Procedure  Date   .  Tubal ligation  16 years ago       Family History   Problem  Relation  Age of Onset   .  Cancer  Mother         lymph nodes   .  Transient ischemic attack  Mother     .  Diabetes  Father        Social History History   Substance Use Topics   .   Smoking status:  Never Smoker    .  Smokeless tobacco:  Never Used   .  Alcohol Use:  No      No Known Allergies    Current Outpatient Prescriptions   Medication  Sig  Dispense  Refill   .  aspirin 81 MG EC tablet  Take 324 mg by mouth daily.           .  Cholecalciferol (D-3-5) 5000 UNITS capsule  Take 5,000 Units by mouth daily.           .  Multiple Vitamins-Minerals (CENTRUM SILVER ULTRA WOMENS PO)  Take 1 capsule by  mouth daily.           .  Omega-3 Fatty Acids (FISH OIL PO)  Take 1,500 Units by mouth.              Review of Systems Review of Systems  Constitutional: Negative for fever, chills and unexpected weight change.  HENT: Negative for hearing loss, congestion, sore throat, trouble swallowing and voice change.   Eyes: Negative for visual disturbance.  Respiratory: Negative for cough and wheezing.   Cardiovascular: Negative for chest pain, palpitations and leg swelling.  Gastrointestinal: Negative for nausea, vomiting, abdominal pain, diarrhea, constipation, blood in stool, abdominal distention and anal bleeding.  Genitourinary: Negative for hematuria, vaginal bleeding and difficulty urinating.  Musculoskeletal: Negative for arthralgias.  Skin: Negative for rash and wound.  Neurological: Negative for seizures, syncope and headaches.  Hematological: Negative for adenopathy. Does not bruise/bleed easily.  Psychiatric/Behavioral: Negative for confusion.    Blood pressure 122/82, pulse 60, temperature 97.6 F (36.4 C), temperature source Temporal, resp. rate 20, height 5\' 2"  (1.575 m), weight 181 lb (82.101 kg).   Physical Exam Physical Exam  Constitutional: She appears well-developed and well-nourished. No distress.  HENT:   Head: Normocephalic.  Eyes: Right eye exhibits no discharge. Left eye exhibits no discharge. No scleral icterus.  Neck: Normal range of motion. Neck supple. No JVD present. No tracheal deviation present. No thyromegaly present.  Cardiovascular:  Normal rate, regular rhythm, normal heart sounds and intact distal pulses.  Exam reveals no friction rub.    No murmur heard. Pulmonary/Chest: Effort normal and breath sounds normal. No respiratory distress. She has no wheezes. She has no rales. She exhibits no tenderness.  Abdominal: Soft. She exhibits no distension and no mass. There is no tenderness. There is no rebound and no guarding.  Musculoskeletal: Normal range of motion. She exhibits no edema and no tenderness.  Neurological: She is alert. She has normal reflexes.  Skin: Skin is warm and dry. No rash noted. She is not diaphoretic. No erythema. No pallor.  Psychiatric: She has a normal mood and affect. Her behavior is normal. Judgment and thought content normal.  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 She understand that the chance of removal of the abnormal area is very good, but that occasionally we are unable to locate it and may have to do a second procedure. We also discussed the possibility of a second procedure to get additional tissue. Risks of surgery such as bleeding and infection have also been explained, as well as the implications of not doing the surgery. She understands and wishes to proceed.         Krystal Woodward 02/23/2011, 10:51 AM

## 2011-02-27 NOTE — Transfer of Care (Signed)
Immediate Anesthesia Transfer of Care Note  Patient: Krystal Woodward  Procedure(s) Performed:  CYST EXCISION BREAST - Needle localization removal left breast mass  Patient Location: PACU  Anesthesia Type: General  Level of Consciousness: sedated  Airway & Oxygen Therapy: Patient Spontanous Breathing and Patient connected to face mask oxygen  Post-op Assessment: Report given to PACU RN, Post -op Vital signs reviewed and stable and Patient moving all extremities X 4  Post vital signs: Reviewed and stable  Complications: No apparent anesthesia complications

## 2011-03-02 ENCOUNTER — Telehealth (INDEPENDENT_AMBULATORY_CARE_PROVIDER_SITE_OTHER): Payer: Self-pay | Admitting: General Surgery

## 2011-03-02 ENCOUNTER — Telehealth (INDEPENDENT_AMBULATORY_CARE_PROVIDER_SITE_OTHER): Payer: Self-pay

## 2011-03-02 NOTE — Telephone Encounter (Signed)
Message copied by Liliana Cline on Thu Mar 02, 2011 12:00 PM ------      Message from: Currie Paris      Created: Thu Mar 02, 2011 11:06 AM       Tell her path is benign and as expected

## 2011-03-02 NOTE — Telephone Encounter (Signed)
Patient made aware path is benign.

## 2011-03-02 NOTE — Telephone Encounter (Signed)
Patient had breast bx done Monday and wants to know when to shower.  I told her she can take the dressing off today and shower.

## 2011-03-06 ENCOUNTER — Encounter (HOSPITAL_BASED_OUTPATIENT_CLINIC_OR_DEPARTMENT_OTHER): Payer: Self-pay | Admitting: Surgery

## 2011-03-21 ENCOUNTER — Ambulatory Visit (INDEPENDENT_AMBULATORY_CARE_PROVIDER_SITE_OTHER): Payer: PRIVATE HEALTH INSURANCE | Admitting: Surgery

## 2011-03-21 ENCOUNTER — Encounter (INDEPENDENT_AMBULATORY_CARE_PROVIDER_SITE_OTHER): Payer: Self-pay | Admitting: Surgery

## 2011-03-21 ENCOUNTER — Telehealth (INDEPENDENT_AMBULATORY_CARE_PROVIDER_SITE_OTHER): Payer: Self-pay | Admitting: Surgery

## 2011-03-21 VITALS — BP 126/82 | HR 64 | Temp 97.0°F | Resp 18 | Ht 62.0 in | Wt 179.5 lb

## 2011-03-21 DIAGNOSIS — T8140XA Infection following a procedure, unspecified, initial encounter: Secondary | ICD-10-CM

## 2011-03-21 MED ORDER — DOXYCYCLINE HYCLATE 100 MG PO TABS: 100.0000 mg | ORAL_TABLET | Freq: Two times a day (BID) | ORAL | Status: DC

## 2011-03-21 NOTE — Patient Instructions (Signed)
Keep follow up with Dr Jamey Ripa in 3 days.  Over the counter pain medications.  Antibiotics.  Warm compresses as needed.

## 2011-03-21 NOTE — Progress Notes (Signed)
The patient presents with a with a 2 day history of increasing swelling of her left breast and redness. She is 3/2 weeks status post left breast lumpectomy by Dr. Georgiann Mohs. She denies any fever or chills.  On exam: Mild erythema along the superior aspect of the left breast incision. Mild induration. Under sterile conditions using one percent lidocaine I aspirated 20 cc of water. To be an old hematoma and sent for culture.  Impression left breast hematoma question abscess    Plan: Start doxycycline 100 mg p.o. b.i.d. and have patient return to see Dr. Georgiann Mohs in 3 days. She will call symptoms worsen.

## 2011-03-22 ENCOUNTER — Other Ambulatory Visit (INDEPENDENT_AMBULATORY_CARE_PROVIDER_SITE_OTHER): Payer: Self-pay | Admitting: General Surgery

## 2011-03-22 DIAGNOSIS — N611 Abscess of the breast and nipple: Secondary | ICD-10-CM

## 2011-03-24 ENCOUNTER — Ambulatory Visit (INDEPENDENT_AMBULATORY_CARE_PROVIDER_SITE_OTHER): Payer: PRIVATE HEALTH INSURANCE | Admitting: Surgery

## 2011-03-24 ENCOUNTER — Encounter (INDEPENDENT_AMBULATORY_CARE_PROVIDER_SITE_OTHER): Payer: Self-pay | Admitting: Surgery

## 2011-03-24 VITALS — BP 122/80 | HR 64 | Resp 12 | Ht 62.0 in | Wt 179.0 lb

## 2011-03-24 DIAGNOSIS — Z09 Encounter for follow-up examination after completed treatment for conditions other than malignant neoplasm: Secondary | ICD-10-CM

## 2011-03-24 NOTE — Patient Instructions (Signed)
Continue the antibiotics and see me againnext week

## 2011-03-24 NOTE — Progress Notes (Signed)
She is folowimg up after developing some erythema of the wound. She has mild pain, no fever,  On exam: Mild erythema along the superior aspect of the left breast incision. Mild induration No fluctuance Impression left breast hematoma question abscess  Data Reviewed: C&S pending. Path noted and reviewed with the patient    Plan: Continue antibiotic and see me in one week

## 2011-03-31 ENCOUNTER — Encounter (INDEPENDENT_AMBULATORY_CARE_PROVIDER_SITE_OTHER): Payer: PRIVATE HEALTH INSURANCE | Admitting: Surgery

## 2011-03-31 ENCOUNTER — Other Ambulatory Visit (INDEPENDENT_AMBULATORY_CARE_PROVIDER_SITE_OTHER): Payer: Self-pay | Admitting: Surgery

## 2011-03-31 ENCOUNTER — Encounter (INDEPENDENT_AMBULATORY_CARE_PROVIDER_SITE_OTHER): Payer: Self-pay | Admitting: Surgery

## 2011-03-31 ENCOUNTER — Ambulatory Visit (INDEPENDENT_AMBULATORY_CARE_PROVIDER_SITE_OTHER): Payer: PRIVATE HEALTH INSURANCE | Admitting: Surgery

## 2011-03-31 VITALS — BP 124/80 | HR 60 | Temp 97.4°F | Resp 18 | Ht 62.0 in | Wt 180.2 lb

## 2011-03-31 DIAGNOSIS — T8140XA Infection following a procedure, unspecified, initial encounter: Secondary | ICD-10-CM

## 2011-03-31 DIAGNOSIS — Z09 Encounter for follow-up examination after completed treatment for conditions other than malignant neoplasm: Secondary | ICD-10-CM

## 2011-03-31 MED ORDER — DOXYCYCLINE HYCLATE 100 MG PO TABS: 100.0000 mg | ORAL_TABLET | Freq: Two times a day (BID) | ORAL | Status: AC

## 2011-03-31 NOTE — Patient Instructions (Signed)
Continue your antibiotics. We are sending a culture to see if we need to change the antibiotic but won't have a report before Monday or Tuesday

## 2011-03-31 NOTE — Progress Notes (Signed)
She is following up after developing some erythema of the wound. She has mild pain, no fever  On exam: Mild erythema along the superior aspect of the left breast incision. Mild induration No fluctuance.  Data Reviewed:Trying to find C&S report Impression: left breast hematoma question abscess  Plan: Under local I aspirated and obtained 12 cc cloudy pink fluid - ? Infected hematoma.  Will continue antibiotic for now and recheck next week. If we can't locate C&S will repeat on today's aspirate

## 2011-04-03 LAB — WOUND CULTURE: Gram Stain: NONE SEEN

## 2011-04-03 NOTE — Op Note (Signed)
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 I initially left breast as the operative side.  The patient taken to the operating room after satisfactory general anesthesia had been obtained left breast was prepped and draped. The timeout was done. I made a curvilinear incision along the upper outer quadrant edge of the areola. I encountered multiple dilated ducts most of which were filled with a creamy material but some with what appeared to be more of a blood-tinged material. I excised as much just ductal tissue as possible including some from underneath the nipple. I had to go quite deeply into the breast as well as fairly far up into the upper outer quadrant to get most of this abnormal tissue out.  Once I thought I had the bulk of the abnormal tissue out I irrigated and made sure everything was dry. I used cautery or sutures as needed. Incision was then closed in multiple layers with 3-0 Vicryl, 4-0 Monocryl subcuticular, and Dermabond. The patient tolerated the procedure well and there were no complications. All counts were correct.  The patient tolerated the procedure well. There were no operative complications. All counts were correct.   EBL: minimal  Currie Paris, MD, FACS 04/03/2011 2:47 PM

## 2011-04-07 ENCOUNTER — Encounter (INDEPENDENT_AMBULATORY_CARE_PROVIDER_SITE_OTHER): Payer: PRIVATE HEALTH INSURANCE | Admitting: Surgery

## 2011-04-11 ENCOUNTER — Ambulatory Visit (INDEPENDENT_AMBULATORY_CARE_PROVIDER_SITE_OTHER): Payer: PRIVATE HEALTH INSURANCE | Admitting: Surgery

## 2011-04-11 ENCOUNTER — Encounter (INDEPENDENT_AMBULATORY_CARE_PROVIDER_SITE_OTHER): Payer: Self-pay | Admitting: Surgery

## 2011-04-11 VITALS — BP 130/88 | HR 60 | Temp 96.8°F | Resp 20 | Ht 63.0 in | Wt 185.8 lb

## 2011-04-11 DIAGNOSIS — Z9889 Other specified postprocedural states: Secondary | ICD-10-CM

## 2011-04-11 NOTE — Progress Notes (Signed)
She is following up after developing some erythema of the wound. She has mild pain, no fever  On exam: Mild erythema along the superior aspect of the left breast incision. Mild induration No fluctuance.Seems better than last visit  Data Reviewed; C7S N/G  Plan:  Will continue antibiotic for now and recheck in two weeks. Did not aspirate today

## 2011-05-05 ENCOUNTER — Ambulatory Visit (INDEPENDENT_AMBULATORY_CARE_PROVIDER_SITE_OTHER): Payer: PRIVATE HEALTH INSURANCE | Admitting: Surgery

## 2011-05-05 ENCOUNTER — Encounter (INDEPENDENT_AMBULATORY_CARE_PROVIDER_SITE_OTHER): Payer: Self-pay | Admitting: Surgery

## 2011-05-05 VITALS — BP 112/68 | HR 64 | Temp 97.7°F | Resp 16 | Ht 62.0 in | Wt 181.2 lb

## 2011-05-05 DIAGNOSIS — Z09 Encounter for follow-up examination after completed treatment for conditions other than malignant neoplasm: Secondary | ICD-10-CM

## 2011-05-05 NOTE — Progress Notes (Signed)
She is following up after developing some erythema of the wound. She has mild pain, no fever. She has been off antibiotics for over a week  On exam: Mild erythema along the superior aspect of the left breast incision. Mild induration No fluctuance Probably no different than last visit  Imp: Wound erythema and induration I think post op changes and not infection Plan:  Will recheck in a few weeks, sooner if any increase in redness or pain. She had extensive fibrocystic changes and this is likely related to that - in terms of slow recovery

## 2011-05-26 ENCOUNTER — Encounter (INDEPENDENT_AMBULATORY_CARE_PROVIDER_SITE_OTHER): Payer: PRIVATE HEALTH INSURANCE | Admitting: Surgery

## 2011-06-16 ENCOUNTER — Ambulatory Visit (INDEPENDENT_AMBULATORY_CARE_PROVIDER_SITE_OTHER): Payer: PRIVATE HEALTH INSURANCE | Admitting: Surgery

## 2011-06-16 ENCOUNTER — Encounter (INDEPENDENT_AMBULATORY_CARE_PROVIDER_SITE_OTHER): Payer: Self-pay | Admitting: Surgery

## 2011-06-16 VITALS — BP 112/76 | HR 64 | Temp 98.2°F | Resp 18 | Ht 62.0 in | Wt 186.2 lb

## 2011-06-16 DIAGNOSIS — N6019 Diffuse cystic mastopathy of unspecified breast: Secondary | ICD-10-CM

## 2011-06-16 NOTE — Patient Instructions (Signed)
We will see you again on an as needed basis. Please call the office at [phone] if you have any questions or concerns. Thank you for allowing us to take care of you.  

## 2011-06-16 NOTE — Progress Notes (Signed)
She is following up after developing some erythema of the wound. 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

## 2011-08-10 ENCOUNTER — Ambulatory Visit: Payer: Self-pay | Admitting: Family Medicine

## 2011-08-29 ENCOUNTER — Telehealth (INDEPENDENT_AMBULATORY_CARE_PROVIDER_SITE_OTHER): Payer: Self-pay | Admitting: General Surgery

## 2011-08-29 NOTE — Telephone Encounter (Signed)
PT CALLED FOR ADVICE AFTER HAVING HER FOLLOW-UP MAMMOGRAM DONE. RADIOLOGY CALLED HER TO COME BACK IN FOR MORE VIEWS OF BREAST DUE TO "ABNORMAL LOOKING AREA" PER PATIENT. PT ASKED IF THAT WAS NECESSARY TO DO. I RECOMMENDED THAT SHE FOLLOW RADIOLOGY INSTRUCTIONS FOR ADDITIONAL VIEWS TO FURTHER R/O AN ABNORMALITY IN VIEW OF THE FACT THAT SHE HAS HAD PREVIOUS BREAST SURGERY./GY

## 2011-08-31 ENCOUNTER — Ambulatory Visit: Payer: Self-pay | Admitting: Family Medicine

## 2011-10-13 ENCOUNTER — Telehealth (INDEPENDENT_AMBULATORY_CARE_PROVIDER_SITE_OTHER): Payer: Self-pay

## 2011-10-13 NOTE — Telephone Encounter (Signed)
Patient called requesting appointment for recurrent breast lump, she reports pain with activities such as bending forward, patient denies redness, swelling, warm to touch or fevers.  Offered urgent office appointment but, she didn't feel it was an urgent office matter.  Patient scheduled for 11/02/11 @ 4:50 pm w/Dr. Jamey Ripa, she would like an earlier appointment as well as an early am appointment if possible.  Please call [phone] to discuss appointment.

## 2011-10-13 NOTE — Telephone Encounter (Signed)
Currently there are no appts available sooner or earlier. I will put patient in my reminders and call if anything opens.

## 2011-11-02 ENCOUNTER — Encounter (INDEPENDENT_AMBULATORY_CARE_PROVIDER_SITE_OTHER): Payer: Self-pay | Admitting: Surgery

## 2011-11-02 ENCOUNTER — Ambulatory Visit (INDEPENDENT_AMBULATORY_CARE_PROVIDER_SITE_OTHER): Payer: PRIVATE HEALTH INSURANCE | Admitting: Surgery

## 2011-11-02 VITALS — BP 114/70 | HR 68 | Temp 98.2°F | Resp 16 | Ht 62.0 in | Wt 188.6 lb

## 2011-11-02 DIAGNOSIS — N6019 Diffuse cystic mastopathy of unspecified breast: Secondary | ICD-10-CM

## 2011-11-02 NOTE — Progress Notes (Signed)
Chief complaint question of right breast mass  History of present illness: This patient was treated by me last year for recurring infections and it didn't severe fibrocystic changes in the upper-outer quadrant of her left breast. This is all resolved and she had a mammogram since then that was negative. Recently she thought she felt some lumps in the right breast in the area she points out is in the lower outer quadrant inframammary fold. However she now notes that they have gone away and she can No longer feel them.l. She came to see me just to be checked.  Exam: Vital signs:BP 114/70  Pulse 68  Temp 98.2 F (36.8 C) (Temporal)  Resp 16  Ht 5\' 2"  (1.575 m)  Wt 188 lb 9.6 oz (85.548 kg)  BMI 34.50 kg/m2 General: Patient alert oriented healthy-appearing Breasts: The right breast is little bit larger than the left due to the left-sided surgery. There is no dominant mass on either side. She is not tender. The area in question has absolutely no abnormality that I can feel. The area of the prior surgical site is well-healed and soft.  Data review: I looked over my office note as well as the path report. She did have severe fibrocystic changes noted.  Impression: That she just has some mild fibrocystic disease. With no dominant mass there is a surgical indication. Since there is nothing palpable in all think we need to do any other diagnostic studies at this point in time.  Plan: She'll come see me again if any further symptoms develop or she feels any other abnormality in her breast.

## 2011-11-02 NOTE — Patient Instructions (Signed)
Continue to have annual mammograms on your usual schedule. Come back to see me again if you think there are any other problems with your breasts.

## 2012-06-16 IMAGING — CR DG CHEST 2V
1 series · 2 of 2 positions shown · non-contrast
Comparison: none

REASON FOR EXAM: chest pain
COMMENTS:

PROCEDURE:     DXR - DXR CHEST PA (OR AP) AND LATERAL  - June 23, 2010  [DATE]
RESULT:     The lung fields are clear. No pneumonia, pneumothorax or pleural
effusion is seen. The heart is upper limits for normal in size. The
mediastinal and osseous structures reveal no significant abnormalities.

[Series 1: view not recorded · 0.17mm/px · 2 of 2 slices shown]
[im 1/2]
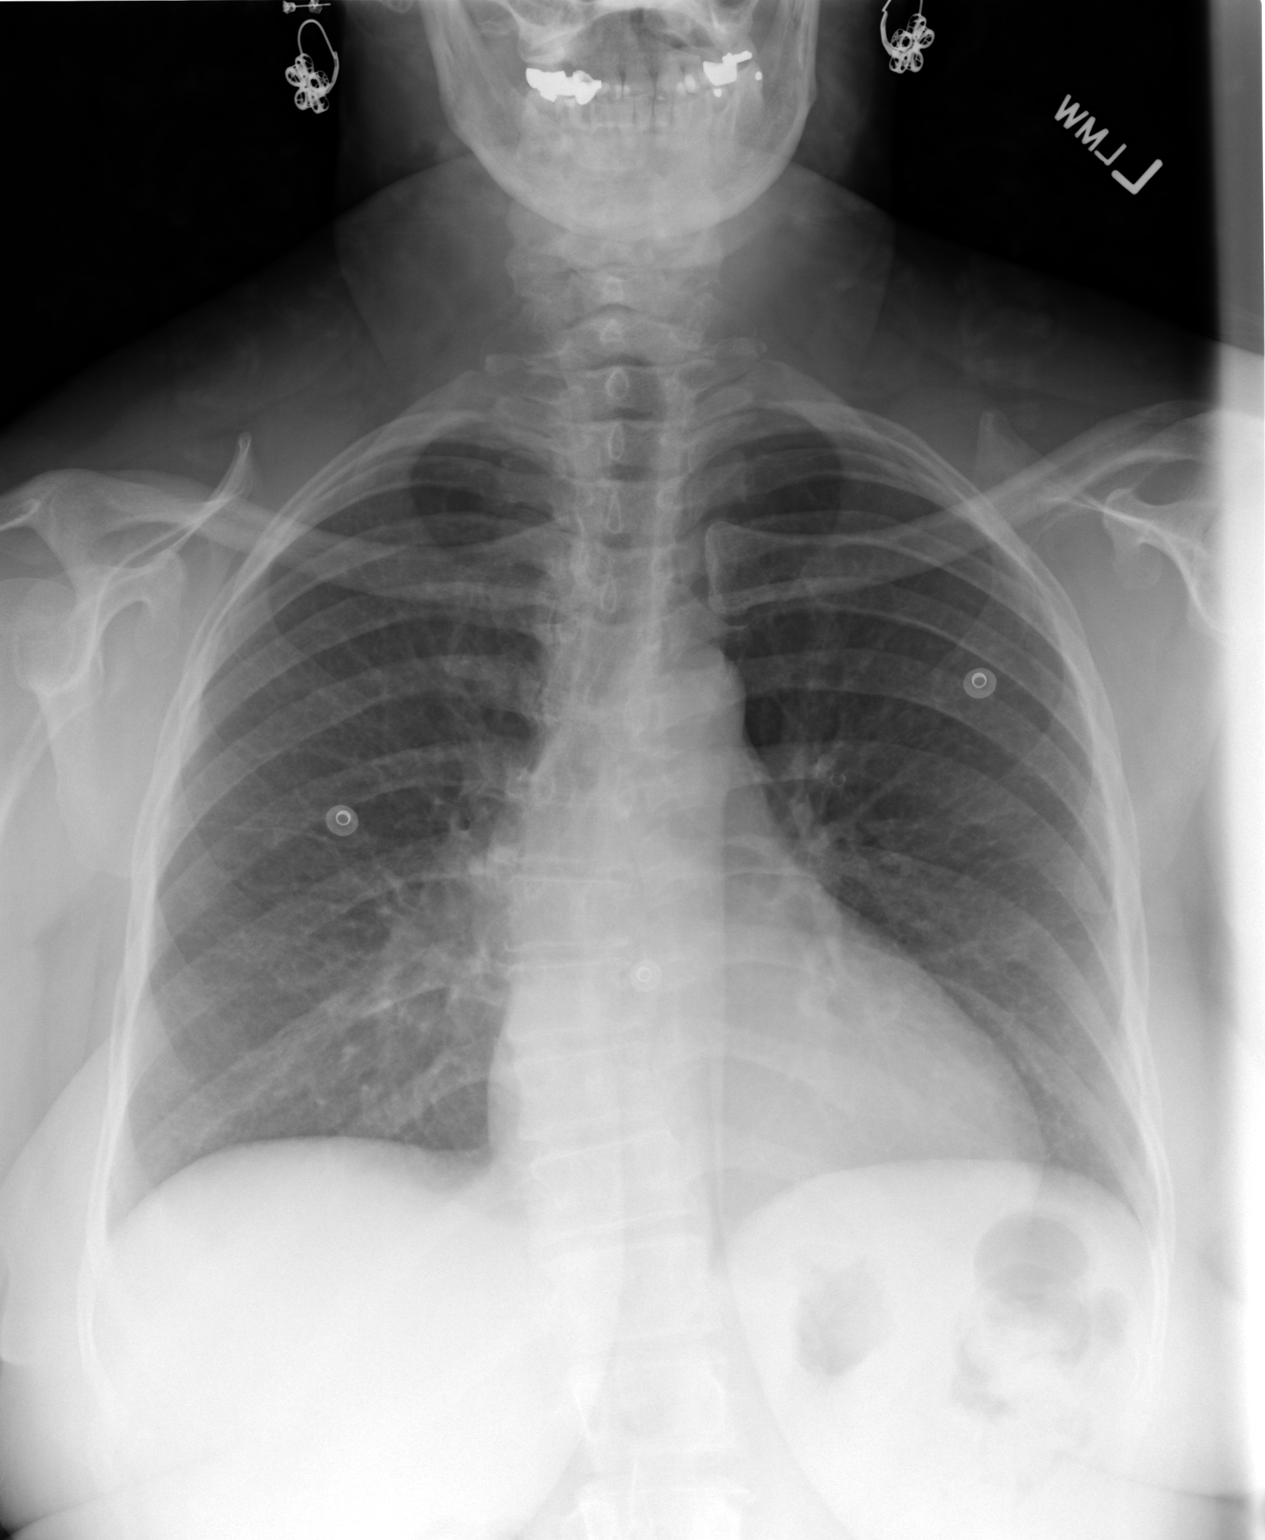
[im 2/2]
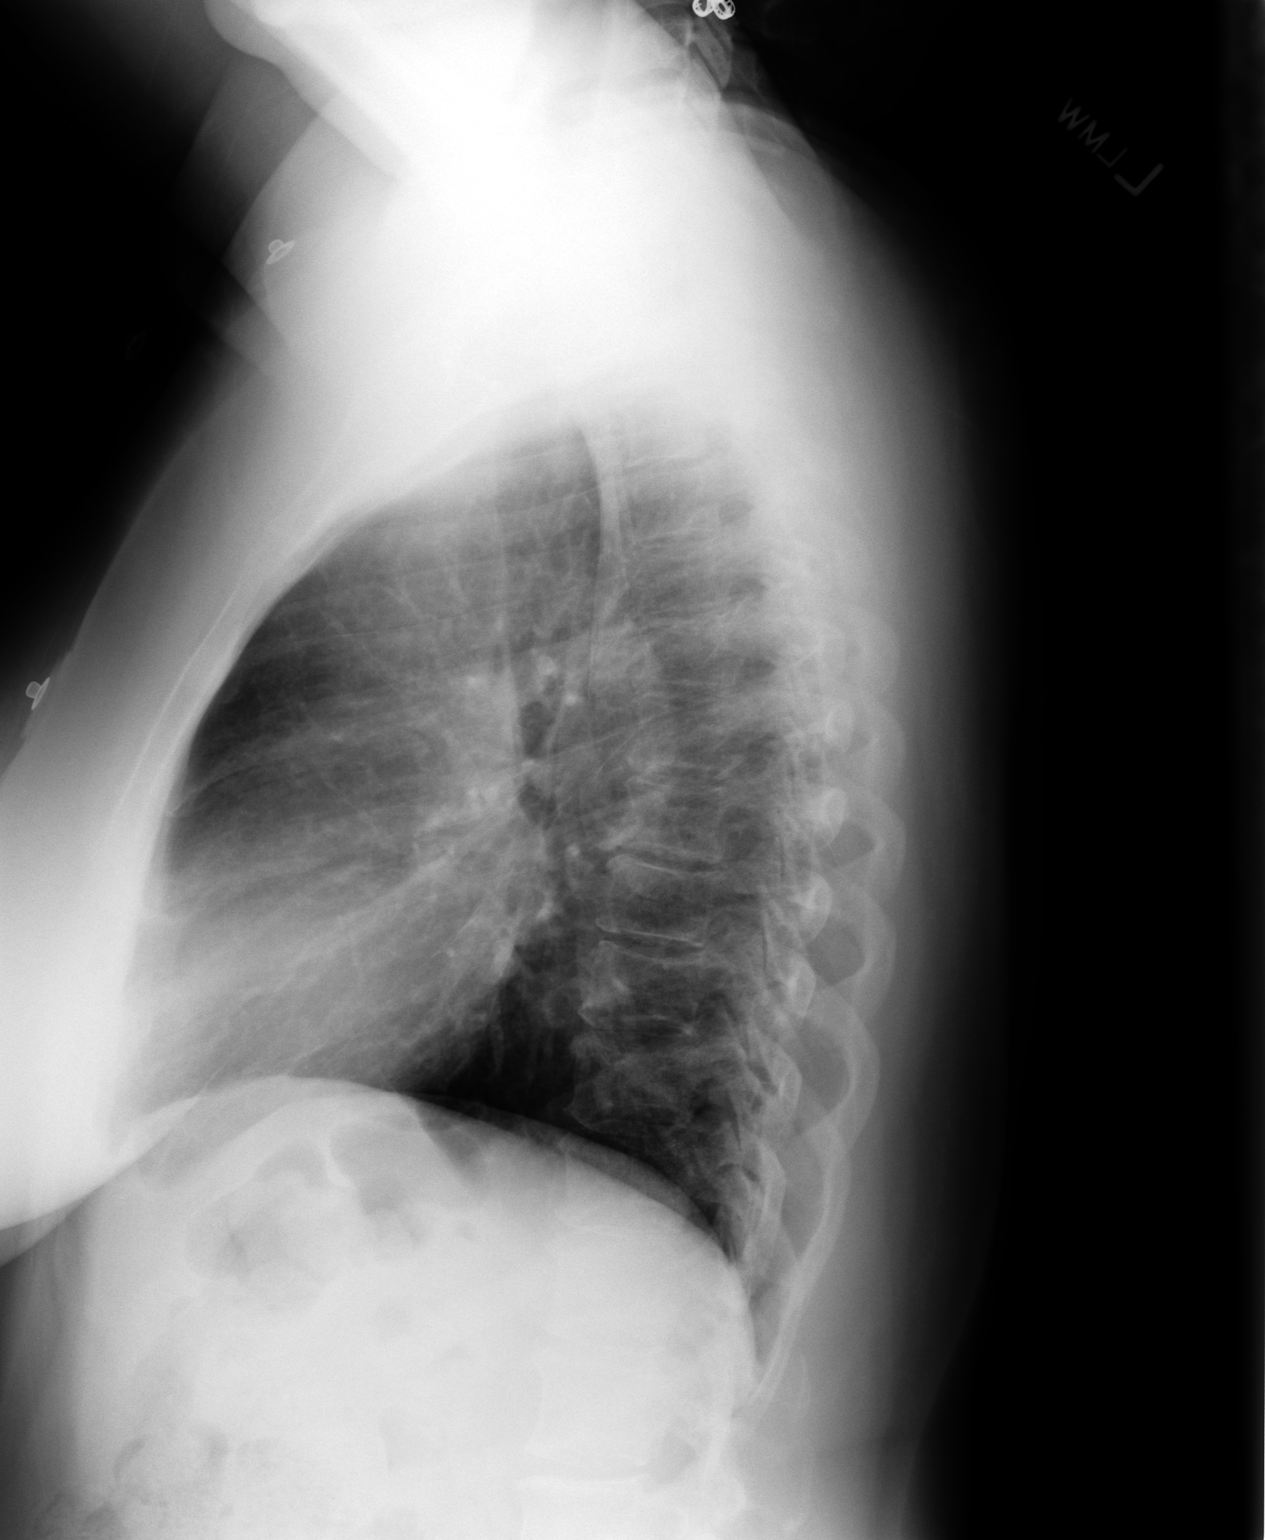

[2 of 2 positions shown; findings below may reference images not displayed]

IMPRESSION: No acute changes are identified.

## 2012-07-23 ENCOUNTER — Other Ambulatory Visit: Payer: Self-pay | Admitting: Family Medicine

## 2012-07-23 LAB — HEMOGLOBIN A1C: Hemoglobin A1C: 5.5 % (ref 4.2–6.3)

## 2013-06-06 ENCOUNTER — Other Ambulatory Visit: Payer: Self-pay | Admitting: Family Medicine

## 2013-06-06 DIAGNOSIS — N6310 Unspecified lump in the right breast, unspecified quadrant: Secondary | ICD-10-CM

## 2013-06-06 DIAGNOSIS — Z9889 Other specified postprocedural states: Secondary | ICD-10-CM

## 2013-06-13 ENCOUNTER — Encounter (INDEPENDENT_AMBULATORY_CARE_PROVIDER_SITE_OTHER): Payer: PRIVATE HEALTH INSURANCE | Admitting: Surgery

## 2013-06-19 ENCOUNTER — Other Ambulatory Visit: Payer: PRIVATE HEALTH INSURANCE

## 2013-06-30 ENCOUNTER — Other Ambulatory Visit: Payer: PRIVATE HEALTH INSURANCE

## 2013-06-30 ENCOUNTER — Ambulatory Visit
Admission: RE | Admit: 2013-06-30 | Discharge: 2013-06-30 | Disposition: A | Discharge status: Home / Self Care | Payer: PRIVATE HEALTH INSURANCE | Source: Ambulatory Visit | Attending: Family Medicine | Admitting: Family Medicine

## 2013-06-30 ENCOUNTER — Ambulatory Visit
Admission: RE | Admit: 2013-06-30 | Discharge: 2013-06-30 | Disposition: A | Discharge status: Home / Self Care | Payer: Commercial Managed Care - PPO | Source: Ambulatory Visit | Attending: Family Medicine | Admitting: Family Medicine

## 2013-06-30 DIAGNOSIS — N6310 Unspecified lump in the right breast, unspecified quadrant: Secondary | ICD-10-CM

## 2013-06-30 DIAGNOSIS — Z9889 Other specified postprocedural states: Secondary | ICD-10-CM

## 2013-07-23 ENCOUNTER — Encounter: Payer: Self-pay | Admitting: Cardiology

## 2013-10-13 ENCOUNTER — Other Ambulatory Visit: Payer: Self-pay | Admitting: Family Medicine

## 2013-10-13 DIAGNOSIS — N6310 Unspecified lump in the right breast, unspecified quadrant: Secondary | ICD-10-CM

## 2013-10-22 ENCOUNTER — Ambulatory Visit
Admission: RE | Admit: 2013-10-22 | Discharge: 2013-10-22 | Disposition: A | Discharge status: Home / Self Care | Payer: Commercial Managed Care - PPO | Source: Ambulatory Visit | Attending: Family Medicine | Admitting: Family Medicine

## 2013-10-22 DIAGNOSIS — N6310 Unspecified lump in the right breast, unspecified quadrant: Secondary | ICD-10-CM

## 2013-10-23 ENCOUNTER — Other Ambulatory Visit: Payer: Commercial Managed Care - PPO

## 2013-11-10 LAB — HM PAP SMEAR: HM Pap smear: NORMAL

## 2014-04-10 ENCOUNTER — Ambulatory Visit: Payer: Self-pay | Admitting: Family Medicine

## 2014-04-13 ENCOUNTER — Ambulatory Visit: Payer: Self-pay | Admitting: Family Medicine

## 2014-06-01 ENCOUNTER — Ambulatory Visit: Payer: Self-pay | Admitting: Unknown Physician Specialty

## 2014-06-01 LAB — HM COLONOSCOPY

## 2014-09-24 ENCOUNTER — Ambulatory Visit (INDEPENDENT_AMBULATORY_CARE_PROVIDER_SITE_OTHER): Payer: Commercial Managed Care - PPO | Admitting: Family Medicine

## 2014-09-24 ENCOUNTER — Emergency Department
Admission: EM | Admit: 2014-09-24 | Discharge: 2014-09-24 | Discharge status: Against Medical Advice | Payer: Commercial Managed Care - PPO | Attending: Emergency Medicine | Admitting: Emergency Medicine

## 2014-09-24 ENCOUNTER — Encounter: Payer: Self-pay | Admitting: Family Medicine

## 2014-09-24 VITALS — BP 106/64 | HR 66 | Temp 98.5°F | Resp 16 | Ht 62.0 in | Wt 182.4 lb

## 2014-09-24 DIAGNOSIS — R300 Dysuria: Secondary | ICD-10-CM | POA: Insufficient documentation

## 2014-09-24 DIAGNOSIS — N3001 Acute cystitis with hematuria: Secondary | ICD-10-CM

## 2014-09-24 LAB — URINALYSIS COMPLETE WITH MICROSCOPIC (ARMC ONLY)
Bilirubin Urine: NEGATIVE
GLUCOSE, UA: NEGATIVE mg/dL
KETONES UR: NEGATIVE mg/dL
Nitrite: NEGATIVE
Protein, ur: 100 mg/dL — AB
Specific Gravity, Urine: 1.012 (ref 1.005–1.030)
Squamous Epithelial / LPF: NONE SEEN
pH: 6 (ref 5.0–8.0)

## 2014-09-24 MED ORDER — CIPROFLOXACIN HCL 250 MG PO TABS: 250.0000 mg | ORAL_TABLET | Freq: Two times a day (BID) | ORAL | Status: DC

## 2014-09-24 NOTE — ED Notes (Signed)
Patient ambulatory to triage with steady gait, without difficulty or distress noted; pt reports dysuria, hematuria and bladder pressure

## 2014-09-24 NOTE — Progress Notes (Signed)
Subjective:     Patient ID: Krystal Woodward, female   DOB: 07-04-1955, 59 y.o.   MRN: [id number]  HPI States she did not want to wait in the ER last night and left when she saw her urine cleared. U/A from ER reviewed and is consistent with infection. States she has not had a UTI for 20 years.  Review of Systems  Constitutional: Negative for fever.  Genitourinary: Positive for dysuria (describes pressure).       Objective:   Physical Exam  Constitutional: She appears well-developed and well-nourished.  No acute distress     Assessment:     1. Acute cystitis with hematuria  - ciprofloxacin (CIPRO) 250 MG tablet; Take 1 tablet (250 mg total) by mouth 2 (two) times daily.  Dispense: 14 tablet; Refill: 0    Plan:     Return of repeat U/A and UCX if not improved.

## 2014-09-24 NOTE — Patient Instructions (Signed)
Push fluids. Will repeat urine and culture if not improving.

## 2014-09-29 ENCOUNTER — Telehealth: Payer: Self-pay | Admitting: Family Medicine

## 2014-09-29 ENCOUNTER — Other Ambulatory Visit: Payer: Self-pay | Admitting: Family Medicine

## 2014-09-29 DIAGNOSIS — N3090 Cystitis, unspecified without hematuria: Secondary | ICD-10-CM

## 2014-09-29 MED ORDER — SULFAMETHOXAZOLE-TRIMETHOPRIM 800-160 MG PO TABS: 1.0000 | ORAL_TABLET | Freq: Two times a day (BID) | ORAL | Status: DC

## 2014-09-29 NOTE — Telephone Encounter (Signed)
Pt stated that the Cipro is causing her to have joint pain and would like you to send in another medication to [facility] [address]. Thanks TNP

## 2014-09-29 NOTE — Telephone Encounter (Signed)
Please send in new antibiotic.

## 2014-09-29 NOTE — Telephone Encounter (Signed)
Please read below...

## 2014-09-29 NOTE — Telephone Encounter (Signed)
Pt states she is having joint pain and burning when voiding.  Pt is requesting a different Rx.  [facility] [address]  CB#[phone]/MJ

## 2014-09-29 NOTE — Telephone Encounter (Signed)
If she has had Cipro for at least 3 days and her symptoms have gotten better that may be all she needs. If symptoms still persisting will add different abx.

## 2014-12-29 ENCOUNTER — Telehealth: Payer: Self-pay | Admitting: Family Medicine

## 2014-12-29 NOTE — Telephone Encounter (Signed)
Believe this is Dr. Theodis Aguas pt. I am not sure what labs need to be ordered. Allene Dillon, CMA

## 2014-12-29 NOTE — Telephone Encounter (Signed)
Pt wants to come back and pick up lab req.  She said she needs to getting it done today.  Call back is [phone]  Thanks teri

## 2014-12-29 NOTE — Telephone Encounter (Signed)
Per Dr. Sherrie Mustache pt advised to schedule CPE. Appt scheduled for 01/04/2015.

## 2014-12-29 NOTE — Telephone Encounter (Signed)
Please check on this. Thanks!

## 2015-01-04 ENCOUNTER — Encounter: Payer: Self-pay | Admitting: Family Medicine

## 2015-01-04 ENCOUNTER — Other Ambulatory Visit: Payer: Self-pay | Admitting: Family Medicine

## 2015-01-04 ENCOUNTER — Ambulatory Visit (INDEPENDENT_AMBULATORY_CARE_PROVIDER_SITE_OTHER): Payer: Commercial Managed Care - PPO | Admitting: Family Medicine

## 2015-01-04 VITALS — BP 110/76 | HR 66 | Temp 98.1°F | Resp 16 | Ht 62.0 in | Wt 182.0 lb

## 2015-01-04 DIAGNOSIS — Z833 Family history of diabetes mellitus: Secondary | ICD-10-CM

## 2015-01-04 DIAGNOSIS — E221 Hyperprolactinemia: Secondary | ICD-10-CM

## 2015-01-04 DIAGNOSIS — M25561 Pain in right knee: Secondary | ICD-10-CM | POA: Insufficient documentation

## 2015-01-04 DIAGNOSIS — Z1231 Encounter for screening mammogram for malignant neoplasm of breast: Secondary | ICD-10-CM

## 2015-01-04 DIAGNOSIS — Z1239 Encounter for other screening for malignant neoplasm of breast: Secondary | ICD-10-CM

## 2015-01-04 DIAGNOSIS — Z01419 Encounter for gynecological examination (general) (routine) without abnormal findings: Secondary | ICD-10-CM

## 2015-01-04 DIAGNOSIS — Z124 Encounter for screening for malignant neoplasm of cervix: Secondary | ICD-10-CM

## 2015-01-04 DIAGNOSIS — Z0000 Encounter for general adult medical examination without abnormal findings: Secondary | ICD-10-CM | POA: Diagnosis not present

## 2015-01-04 DIAGNOSIS — E663 Overweight: Secondary | ICD-10-CM | POA: Insufficient documentation

## 2015-01-04 DIAGNOSIS — Z800 Family history of malignant neoplasm of digestive organs: Secondary | ICD-10-CM | POA: Insufficient documentation

## 2015-01-04 DIAGNOSIS — R5383 Other fatigue: Secondary | ICD-10-CM | POA: Insufficient documentation

## 2015-01-04 DIAGNOSIS — E669 Obesity, unspecified: Secondary | ICD-10-CM

## 2015-01-04 DIAGNOSIS — M25562 Pain in left knee: Secondary | ICD-10-CM | POA: Insufficient documentation

## 2015-01-04 DIAGNOSIS — S40019A Contusion of unspecified shoulder, initial encounter: Secondary | ICD-10-CM | POA: Insufficient documentation

## 2015-01-04 NOTE — Patient Instructions (Signed)
  Pap Tests:  When to Get Screened  You should start getting regular Pap tests at age 57. The Pap test, which screens for cervical cancer, is one of the most reliable and effective cancer screening tests available.  The only cancer for which the Pap test screens is cervical cancer. It does not screen for ovarian, uterine, vaginal, or vulvar cancers. So even if you have a Pap test regularly, if you notice any signs or symptoms that are unusual for you, see a doctor to find out why you're having them. If your Pap test results are normal, your doctor may tell you that you can wait three years until your next Pap test. Prevent Cervical Cancer with the Right Test at the Right Time infographic  Prevent Cervical Cancer with the Right Test at the Right Time infographic  If you are 5 years old or older, you may choose to have an HPV test along with the Pap test. Both tests can be performed by your doctor at the same time. When both tests are performed together, it is called co-testing. If your test results are normal, your chance of getting cervical cancer in the next few years is very low. Your doctor may then tell you that you can wait as long as five years for your next screening. But you should still go to the doctor regularly for a checkup.  If you are 42 to 57 years old, it is important for you to continue getting a Pap test as directed by your doctor-even if you think you are too old to have a child or are not having sex anymore. If you are older than 1 and have had normal Pap test results for several years, or if you have had your cervix removed as part of a total hysterectomy for non-cancerous conditions, like fibroids, your doctor may tell you that you do not need to have a Pap test anymore.

## 2015-01-04 NOTE — Progress Notes (Signed)
Patient: Krystal Woodward, Female    DOB: 11/22/1955, 59 y.o.   MRN: [id number] Visit Date: 01/04/2015  Today's Provider: [name], MD   Chief Complaint  Patient presents with  . Annual Exam   Subjective:    Annual physical exam Krystal Woodward is a 59 y.o. female who presents today for health maintenance and complete physical. She feels well. She reports exercising  daily. She reports she is sleeping well.  -----------------------------------------------------------------   Review of Systems  Constitutional: Negative for fever, chills and fatigue.  HENT: Negative for congestion, ear pain, rhinorrhea, sneezing and sore throat.   Eyes: Negative.  Negative for pain and redness.  Respiratory: Negative for cough, shortness of breath and wheezing.   Cardiovascular: Negative for chest pain and leg swelling.  Gastrointestinal: Negative for nausea, abdominal pain, diarrhea, constipation and blood in stool.  Endocrine: Negative for polydipsia and polyphagia.  Genitourinary: Negative.  Negative for dysuria, hematuria, flank pain, vaginal bleeding, vaginal discharge and pelvic pain.  Musculoskeletal: Negative for back pain, joint swelling, arthralgias and gait problem.  Skin: Negative for rash.  Neurological: Negative.  Negative for dizziness, tremors, seizures, weakness, light-headedness, numbness and headaches.  Hematological: Negative for adenopathy.  Psychiatric/Behavioral: Negative.  Negative for behavioral problems, confusion and dysphoric mood. The patient is not nervous/anxious and is not hyperactive.     Social History She  reports that she has never smoked. She has never used smokeless tobacco. She reports that she does not drink alcohol or use illicit drugs. Social History   Social History  . Marital Status: Married    Spouse Name: N/A  . Number of Children: 4  . Years of Education: N/A   Occupational History  . CNA      [employer]   Social History Main  Topics  . Smoking status: Never Smoker   . Smokeless tobacco: Never Used  . Alcohol Use: No  . Drug Use: No  . Sexual Activity: Not Asked   Other Topics Concern  . None   Social History Narrative    Patient Active Problem List   Diagnosis Date Noted  . Bilateral knee pain 01/04/2015  . Obesity 01/04/2015  . Hyperprolactinemia 01/04/2015  . Fatigue 01/04/2015  . Family history of diabetes mellitus 01/04/2015  . Family history of colon cancer 01/04/2015  . Breast mass 01/24/2011  . MURMUR 06/30/2010  . CHEST PAIN UNSPECIFIED 06/30/2010  . ABNORMAL ELECTROCARDIOGRAM 06/30/2010    Past Surgical History  Procedure Laterality Date  . Bunionectomy      both feet  . Breast cyst excision  02/27/2011    Procedure: CYST EXCISION BREAST;  Surgeon: Currie Paris, MD;  Location: [facility];  Service: General;  Laterality: Left;  Needle localization removal left breast mass  . Tubal ligation  1996    Family History  Family Status  Relation Status Death Age  . Mother Deceased 27  . Father Deceased 66's   Her family history includes Asthma in her paternal grandmother; Cancer in her mother; Cancer (age of onset: 36) in her sister; Diabetes in her father and paternal grandmother; Transient ischemic attack in her mother.    No Known Allergies  Previous Medications   ASPIRIN 81 MG EC TABLET    Take 324 mg by mouth daily.     BIOTIN PO    Take 1 tablet by mouth daily.   CHOLECALCIFEROL (D-3-5) 5000 UNITS CAPSULE    Take 10,000 Units by mouth  daily.    MULTIPLE VITAMINS-MINERALS (CENTRUM SILVER ULTRA WOMENS PO)    Take 1 capsule by mouth daily.     OMEGA-3 FATTY ACIDS (FISH OIL PO)    Take 1,500 Units by mouth.      Patient Care Team: Malva Limes, MD as PCP - General (Family Medicine)     Objective:   Vitals: BP 110/76 mmHg  Pulse 66  Temp(Src) 98.1 F (36.7 C) (Oral)  Resp 16  Ht  (1.575 m)  Wt 182 lb (82.555 kg)  BMI 33.28 kg/m2  SpO2 96%    Physical Exam  General Appearance:    Alert, cooperative, no distress, appears stated age, overweight  Head:    Normocephalic, without obvious abnormality, atraumatic  Eyes:    PERRL, conjunctiva/corneas clear, EOM's intact, fundi    benign, both eyes  Ears:    Normal TM's and external ear canals, both ears  Nose:   Nares normal, septum midline, mucosa normal, no drainage    or sinus tenderness  Throat:   Lips, mucosa, and tongue normal; teeth and gums normal  Neck:   Supple, symmetrical, trachea midline, no adenopathy;    thyroid:  no enlargement/tenderness/nodules; no carotid   bruit or JVD  Back:     Symmetric, no curvature, ROM normal, no CVA tenderness  Lungs:     Clear to auscultation bilaterally, respirations unlabored  Chest Wall:    No tenderness or deformity   Heart:    Regular rate and rhythm, S1 and S2 normal, no murmur, rub   or gallop  Breast Exam:    normal appearance, no masses or tenderness  Abdomen:     Soft, non-tender, bowel sounds active all four quadrants,    no masses, no organomegaly  Pelvic:    cervix normal in appearance and external genitalia normal  Extremities:   Extremities normal, atraumatic, no cyanosis or edema  Pulses:   2+ and symmetric all extremities  Skin:   Skin color, texture, turgor normal, no rashes or lesions  Lymph nodes:   Cervical, supraclavicular, and axillary nodes normal  Neurologic:   CNII-XII intact, normal strength, sensation and reflexes    throughout     Depression Screen PHQ 2/9 Scores 01/04/2015  PHQ - 2 Score 0  PHQ- 9 Score 0      Assessment & Plan:     Routine Health Maintenance and Physical Exam  Exercise Activities and Dietary recommendations Goals    None      Immunization History  Administered Date(s) Administered  . Tdap 01/28/2010        Discussed health benefits of physical activity, and encouraged her to engage in regular exercise appropriate for her age and condition.     --------------------------------------------------------------------  1. Well woman exam with routine gynecological exam Dong well. She declined flu shot today and states she will be getting it at work this year.  - CBC - Comprehensive metabolic panel - Lipid panel  2. Obesity Diet and exercise - TSH  3. Hyperprolactinemia  - Prolactin  4. Screening for cervical cancer Counseled patient of current screening guideline that pap tests be done every 3 years, or every 5 if HPV test s negative. However she has a strong family of cancer and prefers to continue yearly pap tests.  - Pap IG (Image Guided)  5. Family history of diabetes mellitus  - HgB A1c  6. Screening for breast cancer   - MM Digital Screening; Future

## 2015-01-05 LAB — COMPREHENSIVE METABOLIC PANEL
ALBUMIN: 4.6 g/dL (ref 3.5–5.5)
ALK PHOS: 122 IU/L — AB (ref 39–117)
ALT: 15 IU/L (ref 0–32)
AST: 20 IU/L (ref 0–40)
Albumin/Globulin Ratio: 1.4 (ref 1.1–2.5)
BILIRUBIN TOTAL: 0.4 mg/dL (ref 0.0–1.2)
BUN / CREAT RATIO: 20 (ref 9–23)
BUN: 18 mg/dL (ref 6–24)
CHLORIDE: 102 mmol/L (ref 97–108)
CO2: 24 mmol/L (ref 18–29)
Calcium: 9.8 mg/dL (ref 8.7–10.2)
Creatinine, Ser: 0.89 mg/dL (ref 0.57–1.00)
GFR calc Af Amer: 83 mL/min/{1.73_m2} (ref >59)
GFR calc non Af Amer: 72 mL/min/{1.73_m2} (ref >59)
GLUCOSE: 87 mg/dL (ref 65–99)
Globulin, Total: 3.3 g/dL (ref 1.5–4.5)
Potassium: 4.4 mmol/L (ref 3.5–5.2)
Sodium: 141 mmol/L (ref 134–144)
Total Protein: 7.9 g/dL (ref 6.0–8.5)

## 2015-01-05 LAB — CBC
Hematocrit: 40.4 % (ref 34.0–46.6)
Hemoglobin: 13.4 g/dL (ref 11.1–15.9)
MCH: 31.2 pg (ref 26.6–33.0)
MCHC: 33.2 g/dL (ref 31.5–35.7)
MCV: 94 fL (ref 79–97)
PLATELETS: 253 10*3/uL (ref 150–379)
RBC: 4.29 x10E6/uL (ref 3.77–5.28)
RDW: 13.1 % (ref 12.3–15.4)
WBC: 9.3 10*3/uL (ref 3.4–10.8)

## 2015-01-05 LAB — LIPID PANEL
CHOLESTEROL TOTAL: 165 mg/dL (ref 100–199)
Chol/HDL Ratio: 3.7 ratio units (ref 0.0–4.4)
HDL: 45 mg/dL (ref >39)
LDL Calculated: 106 mg/dL — ABNORMAL HIGH (ref 0–99)
TRIGLYCERIDES: 68 mg/dL (ref 0–149)
VLDL CHOLESTEROL CAL: 14 mg/dL (ref 5–40)

## 2015-01-05 LAB — HEMOGLOBIN A1C
Est. average glucose Bld gHb Est-mCnc: 131 mg/dL
HEMOGLOBIN A1C: 6.2 % — AB (ref 4.8–5.6)

## 2015-01-05 LAB — TSH: TSH: 1.84 u[IU]/mL (ref 0.450–4.500)

## 2015-01-05 LAB — PROLACTIN: PROLACTIN: 18.5 ng/mL (ref 4.8–23.3)

## 2015-01-06 ENCOUNTER — Encounter: Payer: Self-pay | Admitting: Family Medicine

## 2015-01-06 DIAGNOSIS — R7303 Prediabetes: Secondary | ICD-10-CM | POA: Insufficient documentation

## 2015-01-06 LAB — PAP IG (IMAGE GUIDED): PAP Smear Comment: 0

## 2015-02-10 ENCOUNTER — Ambulatory Visit
Admission: RE | Admit: 2015-02-10 | Discharge: 2015-02-10 | Disposition: A | Discharge status: Home / Self Care | Payer: Commercial Managed Care - PPO | Source: Ambulatory Visit | Attending: Family Medicine | Admitting: Family Medicine

## 2015-02-10 ENCOUNTER — Ambulatory Visit: Payer: Commercial Managed Care - PPO

## 2015-02-10 DIAGNOSIS — Z1231 Encounter for screening mammogram for malignant neoplasm of breast: Secondary | ICD-10-CM

## 2015-03-01 ENCOUNTER — Ambulatory Visit (INDEPENDENT_AMBULATORY_CARE_PROVIDER_SITE_OTHER): Payer: Commercial Managed Care - PPO | Admitting: Family Medicine

## 2015-03-01 ENCOUNTER — Encounter: Payer: Self-pay | Admitting: Family Medicine

## 2015-03-01 VITALS — BP 132/92 | HR 50 | Temp 98.4°F | Resp 16 | Ht 62.0 in | Wt 174.4 lb

## 2015-03-01 DIAGNOSIS — S46812A Strain of other muscles, fascia and tendons at shoulder and upper arm level, left arm, initial encounter: Secondary | ICD-10-CM

## 2015-03-01 MED ORDER — CYCLOBENZAPRINE HCL 5 MG PO TABS: 5.0000 mg | ORAL_TABLET | Freq: Three times a day (TID) | ORAL | Status: DC | PRN

## 2015-03-01 NOTE — Patient Instructions (Signed)
Discussed use of ibuprofen 400 mg. 3 x day with food.Use cold compresses for 20 minutes 3 x day for first 48 hours then warm compresses
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
## 2015-04-08 ENCOUNTER — Encounter: Payer: Self-pay | Admitting: Family Medicine

## 2015-04-08 ENCOUNTER — Ambulatory Visit: Payer: Commercial Managed Care - PPO | Admitting: Family Medicine

## 2015-04-08 ENCOUNTER — Ambulatory Visit (INDEPENDENT_AMBULATORY_CARE_PROVIDER_SITE_OTHER): Payer: Commercial Managed Care - PPO | Admitting: Family Medicine

## 2015-04-08 VITALS — BP 116/76 | HR 59 | Temp 97.8°F | Resp 16 | Ht 62.0 in | Wt 166.0 lb

## 2015-04-08 DIAGNOSIS — R7303 Prediabetes: Secondary | ICD-10-CM | POA: Diagnosis not present

## 2015-04-08 DIAGNOSIS — M170 Bilateral primary osteoarthritis of knee: Secondary | ICD-10-CM | POA: Diagnosis not present

## 2015-04-08 DIAGNOSIS — M7701 Medial epicondylitis, right elbow: Secondary | ICD-10-CM

## 2015-04-08 LAB — POCT GLYCOSYLATED HEMOGLOBIN (HGB A1C)
Est. average glucose Bld gHb Est-mCnc: 117
Hemoglobin A1C: 5.7

## 2015-04-08 NOTE — Progress Notes (Signed)
Patient: Krystal Woodward Female    DOB: 04/30/1955   59 y.o.   MRN: [id number] Visit Date: 04/08/2015  Today's Provider: Mila Merryonald Khara Renaud, MD   Chief Complaint  Patient presents with  . Hyperglycemia    follow up   Subjective:    HPI   Prediabetes, Follow-up:   Lab Results  Component Value Date   HGBA1C 6.2* 01/04/2015   HGBA1C 5.5 07/23/2012   GLUCOSE 87 01/04/2015    Last seen for for this 3 months ago.  Management since that visit includes recommned cutting back on sweets and starchy foods and exercising 30 minutes four days per week. Current symptoms include none and have been stable. She has been exercising on treadmill, stationary bikes and elliptical up to 60 minutes every day and has cut out all sugars in diet. Has changed to whole wheat pasta.  Weight trend: stable Prior visit with dietician: no Current diet: in general, a "healthy" diet   Current exercise: cardiovascular workout on exercise equipment  Pertinent Labs:    Component Value Date/Time   CHOL 165 01/04/2015 1152   TRIG 68 01/04/2015 1152   CHOLHDL 3.7 01/04/2015 1152   CREATININE 0.89 01/04/2015 1152    Wt Readings from Last 3 Encounters:  04/08/15 166 lb (75.297 kg)  03/01/15 174 lb 6.4 oz (79.107 kg)  01/04/15 182 lb (82.555 kg)    Elbow pain States she woke up this morning with a tender spot on her left elbow. No injury  Bilateral knee pain States both knee pop and crack and ache when she is walking for prolonged periods. Does not take any medications, but is not limiting her activity.      No Known Allergies Previous Medications   ASPIRIN 81 MG EC TABLET    Take 324 mg by mouth daily.     BIOTIN PO    Take 1 tablet by mouth daily.   CHOLECALCIFEROL (D-3-5) 5000 UNITS CAPSULE    Take 10,000 Units by mouth daily.    CYCLOBENZAPRINE (FLEXERIL) 5 MG TABLET    Take 1 tablet (5 mg total) by mouth 3 (three) times daily as needed for muscle spasms.   MULTIPLE VITAMINS-MINERALS  (CENTRUM SILVER ULTRA WOMENS PO)    Take 1 capsule by mouth daily.     OMEGA-3 FATTY ACIDS (FISH OIL PO)    Take 1,500 Units by mouth.     TURMERIC CURCUMIN PO    Take 1 tablet by mouth daily.    Review of Systems  Constitutional: Negative for fever, chills, appetite change and fatigue.  Respiratory: Negative for chest tightness and shortness of breath.   Cardiovascular: Negative for chest pain and palpitations.  Gastrointestinal: Negative for nausea, vomiting and abdominal pain.  Musculoskeletal: Positive for arthralgias (pain in right elbow).  Neurological: Negative for dizziness and weakness.     Social History  Substance Use Topics  . Smoking status: Never Smoker   . Smokeless tobacco: Never Used  . Alcohol Use: No   Objective:   BP 116/76 mmHg  Pulse 59  Temp(Src) 97.8 F (36.6 C) (Oral)  Resp 16  Ht 5\' 2"  (1.575 m)  Wt 166 lb (75.297 kg)  BMI 30.35 kg/m2  SpO2 95%  Physical Exam   General Appearance:    Alert, cooperative, no distress  Eyes:    PERRL, conjunctiva/corneas clear, EOM's intact       Lungs:     Clear to auscultation bilaterally, respirations unlabored  Heart:  Regular rate and rhythm  Neurologic:   Awake, alert, oriented x 3. No apparent focal neurological           defect.   MS:     Point tenderness but no swelling or redness right lateral epicondyle. Crepitus noted both knees, with mild tenderness around patella.     Results for orders placed or performed in visit on 04/08/15  POCT HgB A1C  Result Value Ref Range   Hemoglobin A1C 5.7    Est. average glucose Bld gHb Est-mCnc 117        Assessment & Plan:     1. Pre-diabetes Doing great with diet and exercise which she is to continue. Weight loss noted. Recheck in 5 months.  - POCT HgB A1C  2. Epicondylitis elbow, medial, right 1 day duration. Recommend apply ice pack a few times a day and call if it gets any worse or lasting more than 5 days  3. Osteoarthritis of both knees,  unspecified osteoarthritis type Encouraged she add straight leg raises to current exercise program.        [name], MD  [facility]

## 2015-06-07 ENCOUNTER — Encounter: Payer: Self-pay | Admitting: Family Medicine

## 2015-06-07 ENCOUNTER — Other Ambulatory Visit: Payer: Self-pay | Admitting: Family Medicine

## 2015-06-07 ENCOUNTER — Ambulatory Visit (INDEPENDENT_AMBULATORY_CARE_PROVIDER_SITE_OTHER): Payer: Commercial Managed Care - PPO | Admitting: Family Medicine

## 2015-06-07 VITALS — BP 110/68 | HR 54 | Temp 97.8°F | Resp 16 | Wt 169.0 lb

## 2015-06-07 DIAGNOSIS — N644 Mastodynia: Secondary | ICD-10-CM

## 2015-06-07 DIAGNOSIS — N61 Mastitis without abscess: Secondary | ICD-10-CM | POA: Diagnosis not present

## 2015-06-07 MED ORDER — CEPHALEXIN 500 MG PO CAPS
500.0000 mg | ORAL_CAPSULE | Freq: Four times a day (QID) | ORAL | Status: DC
Start: 2015-06-07 — End: 2015-06-15

## 2015-06-07 NOTE — Progress Notes (Deleted)
Subjective:     Patient ID: Krystal Woodward, female   DOB: 04/18/1956, 59 y.o.   MRN: [id number]  HPI   Review of Systems     Objective:   Physical Exam     Assessment:     ***    Plan:     ***

## 2015-06-07 NOTE — Patient Instructions (Signed)
We will call you with the referral if not made the day of your visit.

## 2015-06-07 NOTE — Progress Notes (Signed)
Subjective:     Patient ID: Krystal Woodward, female   DOB: 12-20-1955, 60 y.o.   MRN: [id number]  HPI  Chief Complaint  Patient presents with  . Breast Pain    Patient reports that she first exprienced pain about 05/29/15. Patient reports that the pain is in her right breast. She reports that she has had this before in the past in her left breast, and it was her milk duct.   Has hx of prior infections in her left breast and duct ectasia. States she went on a cruise and was unable to come in earlier. Has seen a milky discharge on one occasion.    Review of Systems Normal Mammogram October of 2016.    Objective:   Physical Exam  Constitutional: She appears well-developed and well-nourished.  Lymphadenopathy:    She has no axillary adenopathy.  Right breast with sub-areolar induration with mild tenderness. No discharge elicited. No overlying erythema or peau d'orange appearance.     Assessment:    1. Mastitis, right, acute: Wishes to return to the  [facility] where she was seen previously. - Ambulatory referral to General Surgery - cephALEXin (KEFLEX) 500 MG capsule; Take 1 capsule (500 mg total) by mouth 4 (four) times daily.  Dispense: 28 capsule; Refill: 0    Plan:    Antibiotic coverage for mastitis pending further evaluation.

## 2015-06-09 ENCOUNTER — Ambulatory Visit
Admission: RE | Admit: 2015-06-09 | Discharge: 2015-06-09 | Disposition: A | Discharge status: Home / Self Care | Payer: Commercial Managed Care - PPO | Source: Ambulatory Visit | Attending: Family Medicine | Admitting: Family Medicine

## 2015-06-09 ENCOUNTER — Other Ambulatory Visit: Payer: Self-pay | Admitting: Family Medicine

## 2015-06-09 ENCOUNTER — Ambulatory Visit (HOSPITAL_COMMUNITY)
Admission: RE | Admit: 2015-06-09 | Payer: Commercial Managed Care - PPO | Source: Ambulatory Visit | Admitting: Body Imaging

## 2015-06-09 DIAGNOSIS — N644 Mastodynia: Secondary | ICD-10-CM

## 2015-06-11 ENCOUNTER — Other Ambulatory Visit: Payer: Self-pay | Admitting: Family Medicine

## 2015-06-11 DIAGNOSIS — N63 Unspecified lump in unspecified breast: Secondary | ICD-10-CM

## 2015-06-12 LAB — CULTURE, ROUTINE-ABSCESS: Culture: NO GROWTH

## 2015-06-14 ENCOUNTER — Telehealth: Payer: Self-pay | Admitting: Family Medicine

## 2015-06-14 DIAGNOSIS — N61 Mastitis without abscess: Secondary | ICD-10-CM

## 2015-06-14 NOTE — Telephone Encounter (Signed)
Pt needs refill.  She has misplaced her cephALEXin (KEFLEX) 500 MG capsule    She uses [facility]

## 2015-06-14 NOTE — Telephone Encounter (Signed)
Please advise 

## 2015-06-15 MED ORDER — CEPHALEXIN 500 MG PO CAPS: 500.0000 mg | ORAL_CAPSULE | Freq: Four times a day (QID) | ORAL | Status: DC

## 2015-06-17 ENCOUNTER — Ambulatory Visit
Admission: RE | Admit: 2015-06-17 | Discharge: 2015-06-17 | Disposition: A | Discharge status: Home / Self Care | Payer: Commercial Managed Care - PPO | Source: Ambulatory Visit | Attending: Family Medicine | Admitting: Family Medicine

## 2015-06-17 ENCOUNTER — Other Ambulatory Visit (HOSPITAL_COMMUNITY)
Admission: RE | Admit: 2015-06-17 | Discharge: 2015-06-17 | Disposition: A | Discharge status: Home / Self Care | Payer: Commercial Managed Care - PPO | Source: Ambulatory Visit | Attending: Body Imaging | Admitting: Body Imaging

## 2015-06-17 ENCOUNTER — Other Ambulatory Visit: Payer: Self-pay | Admitting: Family Medicine

## 2015-06-17 DIAGNOSIS — N63 Unspecified lump in unspecified breast: Secondary | ICD-10-CM

## 2015-06-17 HISTORY — PX: BREAST BIOPSY: SHX20

## 2015-06-20 LAB — CULTURE, ROUTINE-ABSCESS
Culture: NO GROWTH
GRAM STAIN: NONE SEEN
SPECIAL REQUESTS: NORMAL

## 2015-06-21 ENCOUNTER — Encounter: Payer: Self-pay | Admitting: Family Medicine

## 2015-06-21 ENCOUNTER — Other Ambulatory Visit: Payer: Self-pay | Admitting: Family Medicine

## 2015-06-21 DIAGNOSIS — N63 Unspecified lump in unspecified breast: Secondary | ICD-10-CM

## 2015-07-01 ENCOUNTER — Other Ambulatory Visit: Payer: Commercial Managed Care - PPO

## 2015-07-19 ENCOUNTER — Ambulatory Visit
Admission: RE | Admit: 2015-07-19 | Discharge: 2015-07-19 | Disposition: A | Discharge status: Home / Self Care | Payer: Commercial Managed Care - PPO | Source: Ambulatory Visit | Attending: Family Medicine | Admitting: Family Medicine

## 2015-07-19 ENCOUNTER — Other Ambulatory Visit: Payer: Commercial Managed Care - PPO

## 2015-07-19 DIAGNOSIS — N63 Unspecified lump in unspecified breast: Secondary | ICD-10-CM

## 2015-07-23 ENCOUNTER — Ambulatory Visit: Payer: Self-pay | Admitting: Surgery

## 2015-07-23 NOTE — H&P (Signed)
History of Present Illness Krystal Woodward. Lailany Enoch MD; 07/23/2015 11:48 AM) The patient is a 60 year old female who presents with a breast abscess. This is a 60 year old female with a history of chronic left breast abscesses that were treated with multiple aspirations and then surgical drainage by Dr. Jamey Ripa in 2012. Over the last 2 months, she has developed increasing firmness, pain, and warmth in the right subareolar region. She had a mammogram on 06/09/15 which was aspirated under ultrasound guidance. On 06/17/15 she underwent a repeat mammogram that the placement of 3 biopsy clips from 3 ultrasound-guided biopsies. Pathology revealed acute and chronic inflammation with reactive foamy macrophages. No malignant cells are noted. She had a 1 month follow-up mammogram and ultrasound this week that revealed a 1.5 x 0.6 x 1.6 cm complex fluid collection at 9:00 in the right breast 5 cm from the nipple. She has another 2.6 x 1.4 x 2.2 cm complex fluid collection in the upper outer retroareolar right breast as well. These are consistent with chronic abscesses. She presents now for surgical evaluation. She did have some drainage from her nipple after the last mammogram. There is no erythema of the skin. The patient notices significant asymmetry between her 2 breasts.  CLINICAL DATA: 60 year old female presenting for increasing firmness, pain and warmth in the right subareolar region. This began on February 4th. She has been taking antibiotics for 2 days, so far without improvement.  EXAM: DIGITAL DIAGNOSTIC RIGHT MAMMOGRAM WITH 3D TOMOSYNTHESIS AND CAD  RIGHT BREAST ULTRASOUND  COMPARISON: Previous exam(s).  ACR Breast Density Category b: There are scattered areas of fibroglandular density.  FINDINGS: There is increased density in the subareolar right breast as compared to the October 2016 exam, as well as skin thickening. The increasing density appears to be concentrated primarily just  medial to the nipple. 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 In the right axilla, there is interval thickening of the cortex of 1 of the lymph nodes previously seen.  Mammographic images were processed with CAD.  Physical exam of the subareolar right breast demonstrates a firm palpable mass in in the subareolar breast.  Ultrasound targeted to the subareolar right breast demonstrates at least 2 masses in the subareolar slightly medial right breast. One is hypoechoic measuring 2.7 x 2.3 cm in the anti radial plane. Mobile debris is seen within this mass. The more isoechoic mass abuts this first mass measures approximately 2.2 x 1.7 cm in the anti radial plane. Ultrasound of the lower outer periareolar right breast demonstrates enlarged irregular branching ducts. There does appear to be a filling defect within the duct which was imaged at the 8 o'clock location measuring approximately 0.6 x 0.5 x 0.5 cm. Ultrasound of the right axilla demonstrates a lymph node corresponding to the lymph nodes seen mammographically. The cortex of the lymph node measures approximately 6-7 mm on one side of the lymph node, while the cortex on the contralateral side appears thin, within normal limits for size. This lymph node was seen on prior mammograms however in this asymmetry is similar, though the cortex is more thickened.  IMPRESSION: 1. In the subareolar right breast, there are 2 to adjacent masses, 1 of which demonstrates definite mobile internal debris, likely representing an abscess.  2. There is an enlarged irregular duct in the lower outer right breast with an internal filling defect. This may represent infectious or inflammatory debris  versus an intraductal mass.  3. In the right axilla, there is a lymph node with an asymmetrically thickened cortex. This may be reactive to the infectious  process.  RECOMMENDATION: 1. Ultrasound-guided aspiration is recommended for the probable abscess in the subareolar right breast. This has been added on to the same day interventional schedule.  2. If aspiration proves that infection is present, continued clinical treatment is recommended with a follow-up right breast mammogram and ultrasound in 3-4 weeks is recommended if the patient continues to improve. If the patient's symptoms persist or worsen, she was instructed to follow-up with her doctor for possible biopsy of the subareolar right breast masses and right axillary lymph node.  I have discussed the findings and recommendations with the patient. Results were also provided in writing at the conclusion of the visit. If applicable, a reminder letter will be sent to the patient regarding the next appointment.  BI-RADS CATEGORY 3: Probably benign.   Electronically Signed By: Frederico HammanMichelle Collins M.D. On: 06/09/2015 13:39  CLINICAL DATA: Post biopsy mammogram of the right breast for clip placement. EXAM: DIAGNOSTIC RIGHT MAMMOGRAM POST ULTRASOUND BIOPSY COMPARISON: Previous exam(s). FINDINGS: Mammographic images were obtained following ultrasound guided biopsy of 3 sites in the right breast. A ribbon shaped biopsy marking clip is appropriately positioned in the subareolar right breast. A coil shaped biopsy marking clip is appropriately positioned at the site of biopsy in the lateral right breast at 9 o'clock. The HydroMARK clip in the lymph node in the right axilla is not included in the field of view. IMPRESSION: 1. Appropriate positioning of the ribbon shaped biopsy marking clip in the subareolar right breast. 2. Appropriate positioning of the coil shaped biopsy marking clip in the right breast at 9 o'clock. 3. The HydroMARK clip placed in the right axillary lymph node is not included in the field of view. Final Assessment: Post Procedure Mammograms for Marker Placement  Electronically Signed By: Frederico HammanMichelle Collins M.D. On: 06/17/2015 15:20  ADDENDUM REPORT: 06/21/2015 10:29 ADDENDUM: Pathology revealed cystic wall with acute and chronic inflammation and reactive foamy macrophages in the subareolar right breast, fibrocystic changes with chronic inflammation in the right breast at 9:00, and a reactive right axillary lymph node. Right breast cytology and microbiology yielded no malignant cells and benign reactive/reparative changes. No white blood cells and no organisms were seen. This was found to be concordant by Dr. Frederico HammanMichelle Collins. Pathology results were discussed with the patient by telephone. The patient reported doing well after the biopsy with tenderness at the sites. Post biopsy instructions and care were reviewed and questions were answered. The patient was encouraged to call [facility] for any additional concerns. She is to complete her current course of antibiotics and continue with clinical follow-up. The patient was instructed to return for right diagnostic mammography and ultrasound in 1 month and this is scheduled on July 19, 2015. Dr. Thomasena Edisollins discussed the possibility that if the mass in the subareolar right breast does not completely resolve with aspiration and antibiotics, surgical referral will be recommended for possible surgical incision and drainage to ensure complete resolution. Pathology results reported by Sonnie AlamoLeigh Kuhnly RN, BSN on 06/21/2015. Electronically Signed By: Frederico HammanMichelle Collins M.D. On: 06/21/2015 10:29  CLINICAL DATA: 60 year old female with new palpable lump in the outer right breast discovered on self-examination. Recent right retroareolar collections/ chronic inflammation biopsied and aspirated. History of left breast retroareolar abscesses and surgery.  EXAM: 2D DIGITAL DIAGNOSTIC RIGHT MAMMOGRAM WITH CAD AND ADJUNCT TOMO  ULTRASOUND RIGHT BREAST  COMPARISON: Previous  exam(s).  ACR Breast Density Category b: There are scattered areas of fibroglandular density.  FINDINGS: 2D and 3D full field views and a spot compression view of the right breast again demonstrate increased density in 2 biopsy clips in the retroareolar right breast. Overlying skin thickening is again noted.  No changes identified.  Mammographic images were processed with CAD.  On physical exam, palpable thickening at the 9 o'clock position of the right breast 5 cm from the nipple noted. Diffuse retroareolar right breast thickening is identified.  Targeted ultrasound is performed, showing a 1.5 x 0.6 x 1.6 cm complicated fluid collection at the 9 o'clock position of the right breast 5 cm from the nipple, corresponding to the patient's palpable abnormality. A persistent 2.6 x 1.4 x 2.2 cm complicated fluid collection in the upper outer retroareolar right breast is identified.  IMPRESSION: Complicated fluid collections/abscesses in the outer and retroareolar right breast as described. No evidence suggestive of malignancy.  RECOMMENDATION: The patient desire surgical consultation. An appointment with Dr. Harlon Florseui [facility] has been scheduled for 07/23/2015 and the patient informed.  I have discussed the findings and recommendations with the patient. Results were also provided in writing at the conclusion of the visit. If applicable, a reminder letter will be sent to the patient regarding the next appointment.  BI-RADS CATEGORY 2: Benign.   Electronically Signed By: Harmon PierJeffrey Hu M.D. On: 07/19/2015 11:51   Other Problems (Sonya Bynum, CMA; 07/23/2015 11:06 AM) Lump In Breast  Past Surgical History Gilmer Mor(Sonya Bynum, CMA; 07/23/2015 11:06 AM) Breast Biopsy Bilateral. Foot Surgery Left. Oral Surgery  Diagnostic Studies History Gilmer Mor(Sonya Bynum, CMA; 07/23/2015 11:06 AM) Colonoscopy 1-5 years ago Mammogram 1-3 years ago Pap Smear 1-5 years  ago  Allergies Gilmer Mor(Sonya Bynum, CMA; 07/23/2015 11:06 AM) No Known Drug Allergies 07/23/2015  Medication History (Sonya Bynum, CMA; 07/23/2015 11:07 AM) Turmeric (Curcuma Longa) Active. Biotin (1MG  Capsule, Oral) Active. Aspirin (81MG  Tablet Chewable, Oral) Active. Fish Oil + D3 (1000-1000MG -UNIT Capsule, Oral) Active. Multivitamin Adult (Oral) Active. Medications Reconciled  Pregnancy / Birth History Gilmer Mor(Sonya Bynum, CMA; 07/23/2015 11:06 AM) Age at menarche 16 years. Age of menopause 10851-55 Gravida 3 Maternal age 58-25 Para 3     Review of Systems [place] La[employer](Sonya Bynum CMA; 07/23/2015 11:06 AM) General Not Present- Appetite Loss, Chills, Fatigue, Fever, Night Sweats, Weight Gain and Weight Loss. Skin Not Present- Change in Wart/Mole, Dryness, Hives, Jaundice, New Lesions, Non-Healing Wounds, Rash and Ulcer. HEENT Present- Wears glasses/contact lenses. Not Present- Earache, Hearing Loss, Hoarseness, Nose Bleed, Oral Ulcers, Ringing in the Ears, Seasonal Allergies, Sinus Pain, Sore Throat, Visual Disturbances and Yellow Eyes. Respiratory Not Present- Bloody sputum, Chronic Cough, Difficulty Breathing, Snoring and Wheezing. Breast Present- Breast Mass, Breast Pain and Nipple Discharge. Not Present- Skin Changes. Cardiovascular Not Present- Chest Pain, Difficulty Breathing Lying Down, Leg Cramps, Palpitations, Rapid Heart Rate, Shortness of Breath and Swelling of Extremities. Gastrointestinal Not Present- Abdominal Pain, Bloating, Bloody Stool, Change in Bowel Habits, Chronic diarrhea, Constipation, Difficulty Swallowing, Excessive gas, Gets full quickly at meals, Hemorrhoids, Indigestion, Nausea, Rectal Pain and Vomiting. Female Genitourinary Not Present- Frequency, Nocturia, Painful Urination, Pelvic Pain and Urgency. Musculoskeletal Not Present- Back Pain, Joint Pain, Joint Stiffness, Muscle Pain, Muscle Weakness and Swelling of Extremities. Neurological Not Present- Decreased Memory,  Fainting, Headaches, Numbness, Seizures, Tingling, Tremor, Trouble walking and Weakness. Psychiatric Not Present- Anxiety, Bipolar, Change in Sleep Pattern, Depression, Fearful and Frequent crying. Endocrine Not Present- Cold Intolerance, Excessive Hunger,  Hair Changes, Heat Intolerance, Hot flashes and New Diabetes. Hematology Not Present- Easy Bruising, Excessive bleeding, Gland problems, HIV and Persistent Infections.  Vitals (Sonya Bynum CMA; 07/23/2015 11:06 AM) 07/23/2015 11:06 AM Weight: 166 lb Height: 65in Body Surface Area: 1.83 m Body Mass Index: 27.62 kg/m  Pulse: 54 (Regular)  BP: 128/74 (Sitting, Left Arm, Standard)      Physical Exam Molli Hazard K. Samyukta Cura MD; 07/23/2015 11:50 AM)  The physical exam findings are as follows: Note:WDWN in NAD Left breast - well-healed areolar incision; no palpable masses; no lymphadenopathy Right breast - firm retroareolar mass measuring about 4 cm across. Just lateral to this area there is continued firm tissue measuring about 6 cm across. There is no overlying erythema. This area is mildly tender to palpation. No axillary lymphadenopathy.    Assessment & Plan Krystal Woodward. Vienna Folden MD; 07/23/2015 11:34 AM)  ABSCESS OF BREAST, RIGHT (N61.1)  Current Plans Schedule for Surgery - Incision and drainage of chronic right breast abscess. The surgical procedure has been discussed with the patient. Potential risks, benefits, alternative treatments, and expected outcomes have been explained. All of the patient's questions at this time have been answered. The likelihood of reaching the patient's treatment goal is good. The patient understand the proposed surgical procedure and wishes to proceed.  Krystal Woodward. Corliss Skains, MD, [facility]  General/ Trauma Surgery  07/23/2015 11:50 AM

## 2015-07-26 ENCOUNTER — Encounter (HOSPITAL_COMMUNITY): Payer: Self-pay | Admitting: *Deleted

## 2015-07-28 ENCOUNTER — Ambulatory Visit (HOSPITAL_BASED_OUTPATIENT_CLINIC_OR_DEPARTMENT_OTHER): Payer: Commercial Managed Care - PPO | Admitting: Certified Registered"

## 2015-07-28 ENCOUNTER — Encounter (HOSPITAL_BASED_OUTPATIENT_CLINIC_OR_DEPARTMENT_OTHER): Payer: Self-pay | Admitting: *Deleted

## 2015-07-28 ENCOUNTER — Encounter (HOSPITAL_BASED_OUTPATIENT_CLINIC_OR_DEPARTMENT_OTHER)
Admission: RE | Disposition: A | Discharge status: Home / Self Care | Payer: Self-pay | Source: Ambulatory Visit | Attending: Surgery

## 2015-07-28 ENCOUNTER — Ambulatory Visit (HOSPITAL_COMMUNITY)
Admission: RE | Admit: 2015-07-28 | Discharge: 2015-07-28 | Disposition: A | Discharge status: Home / Self Care | Payer: Commercial Managed Care - PPO | Source: Ambulatory Visit | Attending: Surgery | Admitting: Surgery

## 2015-07-28 DIAGNOSIS — N611 Abscess of the breast and nipple: Secondary | ICD-10-CM | POA: Diagnosis not present

## 2015-07-28 DIAGNOSIS — Z7982 Long term (current) use of aspirin: Secondary | ICD-10-CM | POA: Insufficient documentation

## 2015-07-28 DIAGNOSIS — E669 Obesity, unspecified: Secondary | ICD-10-CM | POA: Insufficient documentation

## 2015-07-28 DIAGNOSIS — Z6830 Body mass index (BMI) 30.0-30.9, adult: Secondary | ICD-10-CM | POA: Diagnosis not present

## 2015-07-28 HISTORY — PX: INCISION AND DRAINAGE ABSCESS: SHX5864

## 2015-07-28 SURGERY — INCISION AND DRAINAGE, ABSCESS
Anesthesia: General | Site: Breast | Laterality: Right

## 2015-07-28 MED ORDER — CHLORHEXIDINE GLUCONATE 4 % EX LIQD: 1.0000 "application " | Freq: Once | CUTANEOUS | Status: DC

## 2015-07-28 MED ORDER — MORPHINE SULFATE (PF) 2 MG/ML IV SOLN: 2.0000 mg | INTRAVENOUS | Status: DC | PRN

## 2015-07-28 MED ORDER — MIDAZOLAM HCL 2 MG/2ML IJ SOLN
1.0000 mg | INTRAMUSCULAR | Status: DC | PRN
  Administered 2015-07-28: 2 mg via INTRAVENOUS

## 2015-07-28 MED ORDER — HYDROCODONE-ACETAMINOPHEN 5-325 MG PO TABS: 1.0000 | ORAL_TABLET | ORAL | Status: DC | PRN

## 2015-07-28 MED ORDER — FENTANYL CITRATE (PF) 100 MCG/2ML IJ SOLN
50.0000 ug | INTRAMUSCULAR | Status: DC | PRN
  Administered 2015-07-28: 50 ug via INTRAVENOUS

## 2015-07-28 MED ORDER — METHYLENE BLUE 0.5 % INJ SOLN
INTRAVENOUS | Status: AC
  Filled 2015-07-28: qty 10

## 2015-07-28 MED ORDER — CEFAZOLIN SODIUM-DEXTROSE 2-4 GM/100ML-% IV SOLN
2.0000 g | INTRAVENOUS | Status: AC
Start: 2015-07-28 — End: 2015-07-28
  Administered 2015-07-28: 2 g via INTRAVENOUS

## 2015-07-28 MED ORDER — PROMETHAZINE HCL 25 MG/ML IJ SOLN: 6.2500 mg | INTRAMUSCULAR | Status: DC | PRN

## 2015-07-28 MED ORDER — SCOPOLAMINE 1 MG/3DAYS TD PT72: 1.0000 | MEDICATED_PATCH | Freq: Once | TRANSDERMAL | Status: DC | PRN

## 2015-07-28 MED ORDER — SODIUM CHLORIDE 0.9 % IJ SOLN
INTRAMUSCULAR | Status: AC
  Filled 2015-07-28: qty 10

## 2015-07-28 MED ORDER — MIDAZOLAM HCL 2 MG/2ML IJ SOLN
INTRAMUSCULAR | Status: AC
  Filled 2015-07-28: qty 2

## 2015-07-28 MED ORDER — PROPOFOL 500 MG/50ML IV EMUL
INTRAVENOUS | Status: AC
  Filled 2015-07-28: qty 50

## 2015-07-28 MED ORDER — GLYCOPYRROLATE 0.2 MG/ML IJ SOLN: 0.2000 mg | Freq: Once | INTRAMUSCULAR | Status: DC | PRN

## 2015-07-28 MED ORDER — LIDOCAINE HCL (CARDIAC) 20 MG/ML IV SOLN
INTRAVENOUS | Status: AC
  Filled 2015-07-28: qty 5

## 2015-07-28 MED ORDER — FENTANYL CITRATE (PF) 100 MCG/2ML IJ SOLN
INTRAMUSCULAR | Status: AC
  Filled 2015-07-28: qty 2

## 2015-07-28 MED ORDER — PROPOFOL 10 MG/ML IV BOLUS
INTRAVENOUS | Status: DC | PRN
  Administered 2015-07-28: 100 mg via INTRAVENOUS

## 2015-07-28 MED ORDER — ONDANSETRON HCL 4 MG/2ML IJ SOLN: 4.0000 mg | INTRAMUSCULAR | Status: DC | PRN

## 2015-07-28 MED ORDER — EPHEDRINE SULFATE 50 MG/ML IJ SOLN
INTRAMUSCULAR | Status: AC
  Filled 2015-07-28: qty 1

## 2015-07-28 MED ORDER — LACTATED RINGERS IV SOLN
INTRAVENOUS | Status: DC
  Administered 2015-07-28 (×2): via INTRAVENOUS

## 2015-07-28 MED ORDER — ONDANSETRON HCL 4 MG/2ML IJ SOLN
INTRAMUSCULAR | Status: DC | PRN
  Administered 2015-07-28: 4 mg via INTRAVENOUS

## 2015-07-28 MED ORDER — ONDANSETRON HCL 4 MG/2ML IJ SOLN
INTRAMUSCULAR | Status: AC
  Filled 2015-07-28: qty 2

## 2015-07-28 MED ORDER — BUPIVACAINE-EPINEPHRINE (PF) 0.25% -1:200000 IJ SOLN
INTRAMUSCULAR | Status: AC
  Filled 2015-07-28: qty 30

## 2015-07-28 MED ORDER — LIDOCAINE HCL (CARDIAC) 20 MG/ML IV SOLN
INTRAVENOUS | Status: DC | PRN
  Administered 2015-07-28: 60 mg via INTRAVENOUS

## 2015-07-28 MED ORDER — CEFAZOLIN SODIUM-DEXTROSE 2-4 GM/100ML-% IV SOLN
INTRAVENOUS | Status: AC
  Filled 2015-07-28: qty 100

## 2015-07-28 MED ORDER — BUPIVACAINE-EPINEPHRINE 0.25% -1:200000 IJ SOLN
INTRAMUSCULAR | Status: DC | PRN
  Administered 2015-07-28: 10 mL

## 2015-07-28 MED ORDER — FENTANYL CITRATE (PF) 100 MCG/2ML IJ SOLN: 25.0000 ug | INTRAMUSCULAR | Status: DC | PRN

## 2015-07-28 MED ORDER — DEXAMETHASONE SODIUM PHOSPHATE 10 MG/ML IJ SOLN
INTRAMUSCULAR | Status: AC
  Filled 2015-07-28: qty 1

## 2015-07-28 MED ORDER — DEXAMETHASONE SODIUM PHOSPHATE 4 MG/ML IJ SOLN
INTRAMUSCULAR | Status: DC | PRN
  Administered 2015-07-28: 10 mg via INTRAVENOUS

## 2015-07-28 SURGICAL SUPPLY — 49 items
APL SKNCLS STERI-STRIP NONHPOA (GAUZE/BANDAGES/DRESSINGS) ×1
BENZOIN TINCTURE PRP APPL 2/3 (GAUZE/BANDAGES/DRESSINGS) ×3 IMPLANT
BLADE HEX COATED 2.75 (ELECTRODE) ×3 IMPLANT
BLADE SURG 15 STRL LF DISP TIS (BLADE) ×1 IMPLANT
BLADE SURG 15 STRL SS (BLADE) ×3
CANISTER SUCT 1200ML W/VALVE (MISCELLANEOUS) ×3 IMPLANT
CHLORAPREP W/TINT 26ML (MISCELLANEOUS) ×3 IMPLANT
CLOSURE WOUND 1/2 X4 (GAUZE/BANDAGES/DRESSINGS) ×1
COVER BACK TABLE 60X90IN (DRAPES) ×3 IMPLANT
COVER MAYO STAND STRL (DRAPES) ×3 IMPLANT
DECANTER SPIKE VIAL GLASS SM (MISCELLANEOUS) ×3 IMPLANT
DRAIN PENROSE 1/2X12 LTX STRL (WOUND CARE) ×2 IMPLANT
DRAPE LAPAROTOMY 100X72 PEDS (DRAPES) ×3 IMPLANT
DRAPE UTILITY XL STRL (DRAPES) ×3 IMPLANT
DRSG TEGADERM 4X4.75 (GAUZE/BANDAGES/DRESSINGS) ×3 IMPLANT
ELECT REM PT RETURN 9FT ADLT (ELECTROSURGICAL) ×3
ELECTRODE REM PT RTRN 9FT ADLT (ELECTROSURGICAL) ×1 IMPLANT
GAUZE SPONGE 4X4 12PLY STRL (GAUZE/BANDAGES/DRESSINGS) ×2 IMPLANT
GLOVE BIO SURGEON STRL SZ7 (GLOVE) ×3 IMPLANT
GLOVE BIOGEL PI IND STRL 7.5 (GLOVE) ×1 IMPLANT
GLOVE BIOGEL PI INDICATOR 7.5 (GLOVE) ×2
GOWN STRL REUS W/ TWL LRG LVL3 (GOWN DISPOSABLE) ×1 IMPLANT
GOWN STRL REUS W/TWL LRG LVL3 (GOWN DISPOSABLE) ×3
NDL HYPO 25X1 1.5 SAFETY (NEEDLE) ×1 IMPLANT
NEEDLE HYPO 25X1 1.5 SAFETY (NEEDLE) ×3 IMPLANT
NS IRRIG 1000ML POUR BTL (IV SOLUTION) IMPLANT
PACK BASIN DAY SURGERY FS (CUSTOM PROCEDURE TRAY) ×3 IMPLANT
PAD ABD 8X10 STRL (GAUZE/BANDAGES/DRESSINGS) ×2 IMPLANT
PENCIL BUTTON HOLSTER BLD 10FT (ELECTRODE) ×3 IMPLANT
SLEEVE SCD COMPRESS KNEE MED (MISCELLANEOUS) ×3 IMPLANT
SPONGE GAUZE 2X2 8PLY STER LF (GAUZE/BANDAGES/DRESSINGS)
SPONGE GAUZE 2X2 8PLY STRL LF (GAUZE/BANDAGES/DRESSINGS) IMPLANT
SPONGE GAUZE 4X4 12PLY STER LF (GAUZE/BANDAGES/DRESSINGS) IMPLANT
SPONGE LAP 4X18 X RAY DECT (DISPOSABLE) ×3 IMPLANT
STRIP CLOSURE SKIN 1/2X4 (GAUZE/BANDAGES/DRESSINGS) ×2 IMPLANT
SUT CHROMIC 3 0 SH 27 (SUTURE) IMPLANT
SUT ETHILON 2 0 FS 18 (SUTURE) ×2 IMPLANT
SUT MON AB 4-0 PC3 18 (SUTURE) ×3 IMPLANT
SUT VIC AB 3-0 SH 27 (SUTURE) ×3
SUT VIC AB 3-0 SH 27X BRD (SUTURE) ×1 IMPLANT
SWAB COLLECTION DEVICE MRSA (MISCELLANEOUS) IMPLANT
SWAB CULTURE ESWAB REG 1ML (MISCELLANEOUS) IMPLANT
SYR BULB 3OZ (MISCELLANEOUS) ×2 IMPLANT
SYR CONTROL 10ML LL (SYRINGE) ×3 IMPLANT
TOWEL OR 17X24 6PK STRL BLUE (TOWEL DISPOSABLE) ×6 IMPLANT
TOWEL OR NON WOVEN STRL DISP B (DISPOSABLE) ×3 IMPLANT
TUBE CONNECTING 20'X1/4 (TUBING) ×1
TUBE CONNECTING 20X1/4 (TUBING) ×2 IMPLANT
YANKAUER SUCT BULB TIP NO VENT (SUCTIONS) ×3 IMPLANT

## 2015-07-28 NOTE — Anesthesia Procedure Notes (Signed)
Procedure Name: LMA Insertion Date/Time: 07/28/2015 8:27 AM Performed by: Shaleah Nissley D Pre-anesthesia Checklist: Patient identified, Emergency Drugs available, Suction available and Patient being monitored Patient Re-evaluated:Patient Re-evaluated prior to inductionOxygen Delivery Method: Circle System Utilized Preoxygenation: Pre-oxygenation with 100% oxygen Intubation Type: IV induction Ventilation: Mask ventilation without difficulty LMA: LMA inserted LMA Size: 4.0 Number of attempts: 1 Airway Equipment and Method: Bite block Placement Confirmation: positive ETCO2 Tube secured with: Tape Dental Injury: Teeth and Oropharynx as per pre-operative assessment

## 2015-07-28 NOTE — Anesthesia Preprocedure Evaluation (Addendum)
Anesthesia Evaluation  Patient identified by MRN, date of birth, ID band Patient awake    Reviewed: Allergy & Precautions, NPO status , Patient's Chart, lab work & pertinent test results  Airway Mallampati: I  TM Distance: >3 FB Neck ROM: Full    Dental  (+) Teeth Intact, Dental Advisory Given   Pulmonary neg pulmonary ROS,    Pulmonary exam normal breath sounds clear to auscultation       Cardiovascular Exercise Tolerance: Good negative cardio ROS Normal cardiovascular exam+ Valvular Problems/Murmurs  Rhythm:Regular Rate:Normal     Neuro/Psych negative neurological ROS  negative psych ROS   GI/Hepatic negative GI ROS, Neg liver ROS, GERD  Medicated,  Endo/Other  Obesity   Renal/GU negative Renal ROS     Musculoskeletal negative musculoskeletal ROS (+)   Abdominal   Peds  Hematology negative hematology ROS (+)   Anesthesia Other Findings Day of surgery medications reviewed with the patient.  Reproductive/Obstetrics                           Anesthesia Physical Anesthesia Plan  ASA: II  Anesthesia Plan: General   Post-op Pain Management:    Induction: Intravenous  Airway Management Planned: LMA  Additional Equipment:   Intra-op Plan:   Post-operative Plan: Extubation in OR  Informed Consent: I have reviewed the patients History and Physical, chart, labs and discussed the procedure including the risks, benefits and alternatives for the proposed anesthesia with the patient or authorized representative who has indicated his/her understanding and acceptance.   Dental advisory given  Plan Discussed with: CRNA  Anesthesia Plan Comments: (Risks/benefits of general anesthesia discussed with patient including risk of damage to teeth, lips, gum, and tongue, nausea/vomiting, allergic reactions to medications, and the possibility of heart attack, stroke and death.  All patient questions  answered.  Patient wishes to proceed.)        Anesthesia Quick Evaluation

## 2015-07-28 NOTE — Interval H&P Note (Signed)
History and Physical Interval Note:  07/28/2015 7:20 AM  Krystal Woodward  has presented today for surgery, with the diagnosis of CHRONIC RIGHT BREAST ABSCESS  The various methods of treatment have been discussed with the patient and family. After consideration of risks, benefits and other options for treatment, the patient has consented to  Procedure(s): INCISION AND DRAINAGE RIGHT BREAST  ABSCESS (Right) as a surgical intervention .  The patient's history has been reviewed, patient examined, no change in status, stable for surgery.  I have reviewed the patient's chart and labs.  Questions were answered to the patient's satisfaction.     Nakhi Choi K.

## 2015-07-28 NOTE — Op Note (Signed)
Preop diagnosis: Chronic right breast abscesses Postop diagnosis: Same Procedure performed: Incision and drainage of chronic right breast abscesses Surgeon:Krystal Balzarini K. Anesthesia: Gen. via LMA Indications:  This is a 60 year old female who has a history of chronic left breast abscesses who is status post surgical drainage by Dr. Jamey Woodward in 2012. Over the last 2 months, she has developed increasing firmness, pain, and warmth in the right breast lateral to areola and behind her areola. Aspiration was performed that showed acute and chronic inflammation but no malignant cells. Ultrasound showed a 1.5 x 0.6 x 1.6 cm complex fluid collection at 9:00 and another 2.6 x 1.4 x 2.2 cm complex fluid collection in the upper outer retroareolar right breast. On examination there is no erythema of the skin. She presents now for drainage of these areas.  Description of procedure: The patient is brought to the operating room and placed in supine position on the operating room table. After an adequate level of general anesthesia was obtained, her right breast was prepped with ChloraPrep and draped sterile fashion. A timeout was taken to ensure the proper patient and proper procedure. The area around the right nipple was infiltrated with 0.25% Marcaine with epinephrine. A curvilinear circumareolar incision was made on the lateral part of the nipple. We dissected down the subcutaneous tissues. We tracked medially behind the areole. We encountered several pockets of cloudy brown fluid. This fluid was cultured and sent for microbiology. Some thick oily drainage was also encountered. There seemed to be several pockets containing this brownish fluid. We excised the small cystic cavities and sent these for pathologic examination. The seen to track directly behind the nipple and may represent chronically occluded milk ducts. We then turned our attention to the area laterally at 9:00. We dissected into more of these cystic pockets  containing thick brownish pasty substance. We excised these cystic areas extending deep into the breast. We then irrigated the wound thoroughly and inspected for hemostasis. No further drainage is noted. We used a half-inch Penrose drain and cut it into 2 pieces. We placed 1 deep into the pocket laterally. We placed the other drain tracking medially behind the nipple. We then secured the drains in place with interrupted 2-0 nylon sutures. We loosely reapproximated the skin of the circumareolar incision with 2-0 nylon. A dry dressing was then applied. The patient was then next made about recovery was stable condition. All sponge, initially, and needle counts are correct.  Krystal ArmsMatthew K. Corliss Skainssuei, MD, [facility]  General/ Trauma Surgery  07/28/2015 9:13 AM

## 2015-07-28 NOTE — H&P (View-Only) (Signed)
History of Present Illness Krystal Woodward. Krystal Floren MD; 07/23/2015 11:48 AM) The patient is a 60 year old female who presents with a breast abscess. This is a 60 year old female with a history of chronic left breast abscesses that were treated with multiple aspirations and then surgical drainage by Dr. Jamey Ripa in 2012. Over the last 2 months, she has developed increasing firmness, pain, and warmth in the right subareolar region. She had a mammogram on 06/09/15 which was aspirated under ultrasound guidance. On 06/17/15 she underwent a repeat mammogram that the placement of 3 biopsy clips from 3 ultrasound-guided biopsies. Pathology revealed acute and chronic inflammation with reactive foamy macrophages. No malignant cells are noted. She had a 1 month follow-up mammogram and ultrasound this week that revealed a 1.5 x 0.6 x 1.6 cm complex fluid collection at 9:00 in the right breast 5 cm from the nipple. She has another 2.6 x 1.4 x 2.2 cm complex fluid collection in the upper outer retroareolar right breast as well. These are consistent with chronic abscesses. She presents now for surgical evaluation. She did have some drainage from her nipple after the last mammogram. There is no erythema of the skin. The patient notices significant asymmetry between her 2 breasts.  CLINICAL DATA: 60 year old female presenting for increasing firmness, pain and warmth in the right subareolar region. This began on February 4th. She has been taking antibiotics for 2 days, so far without improvement.  EXAM: DIGITAL DIAGNOSTIC RIGHT MAMMOGRAM WITH 3D TOMOSYNTHESIS AND CAD  RIGHT BREAST ULTRASOUND  COMPARISON: Previous exam(s).  ACR Breast Density Category b: There are scattered areas of fibroglandular density.  FINDINGS: There is increased density in the subareolar right breast as compared to the October 2016 exam, as well as skin thickening. The increasing density appears to be concentrated primarily just  medial to the nipple. 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 In the right axilla, there is interval thickening of the cortex of 1 of the lymph nodes previously seen.  Mammographic images were processed with CAD.  Physical exam of the subareolar right breast demonstrates a firm palpable mass in in the subareolar breast.  Ultrasound targeted to the subareolar right breast demonstrates at least 2 masses in the subareolar slightly medial right breast. One is hypoechoic measuring 2.7 x 2.3 cm in the anti radial plane. Mobile debris is seen within this mass. The more isoechoic mass abuts this first mass measures approximately 2.2 x 1.7 cm in the anti radial plane. Ultrasound of the lower outer periareolar right breast demonstrates enlarged irregular branching ducts. There does appear to be a filling defect within the duct which was imaged at the 8 o'clock location measuring approximately 0.6 x 0.5 x 0.5 cm. Ultrasound of the right axilla demonstrates a lymph node corresponding to the lymph nodes seen mammographically. The cortex of the lymph node measures approximately 6-7 mm on one side of the lymph node, while the cortex on the contralateral side appears thin, within normal limits for size. This lymph node was seen on prior mammograms however in this asymmetry is similar, though the cortex is more thickened.  IMPRESSION: 1. In the subareolar right breast, there are 2 to adjacent masses, 1 of which demonstrates definite mobile internal debris, likely representing an abscess.  2. There is an enlarged irregular duct in the lower outer right breast with an internal filling defect. This may represent infectious or inflammatory debris  versus an intraductal mass.  3. In the right axilla, there is a lymph node with an asymmetrically thickened cortex. This may be reactive to the infectious  process.  RECOMMENDATION: 1. Ultrasound-guided aspiration is recommended for the probable abscess in the subareolar right breast. This has been added on to the same day interventional schedule.  2. If aspiration proves that infection is present, continued clinical treatment is recommended with a follow-up right breast mammogram and ultrasound in 3-4 weeks is recommended if the patient continues to improve. If the patient's symptoms persist or worsen, she was instructed to follow-up with her doctor for possible biopsy of the subareolar right breast masses and right axillary lymph node.  I have discussed the findings and recommendations with the patient. Results were also provided in writing at the conclusion of the visit. If applicable, a reminder letter will be sent to the patient regarding the next appointment.  BI-RADS CATEGORY 3: Probably benign.   Electronically Signed By: Frederico HammanMichelle Collins M.D. On: 06/09/2015 13:39  CLINICAL DATA: Post biopsy mammogram of the right breast for clip placement. EXAM: DIAGNOSTIC RIGHT MAMMOGRAM POST ULTRASOUND BIOPSY COMPARISON: Previous exam(s). FINDINGS: Mammographic images were obtained following ultrasound guided biopsy of 3 sites in the right breast. A ribbon shaped biopsy marking clip is appropriately positioned in the subareolar right breast. A coil shaped biopsy marking clip is appropriately positioned at the site of biopsy in the lateral right breast at 9 o'clock. The HydroMARK clip in the lymph node in the right axilla is not included in the field of view. IMPRESSION: 1. Appropriate positioning of the ribbon shaped biopsy marking clip in the subareolar right breast. 2. Appropriate positioning of the coil shaped biopsy marking clip in the right breast at 9 o'clock. 3. The HydroMARK clip placed in the right axillary lymph node is not included in the field of view. Final Assessment: Post Procedure Mammograms for Marker Placement  Electronically Signed By: Frederico HammanMichelle Collins M.D. On: 06/17/2015 15:20  ADDENDUM REPORT: 06/21/2015 10:29 ADDENDUM: Pathology revealed cystic wall with acute and chronic inflammation and reactive foamy macrophages in the subareolar right breast, fibrocystic changes with chronic inflammation in the right breast at 9:00, and a reactive right axillary lymph node. Right breast cytology and microbiology yielded no malignant cells and benign reactive/reparative changes. No white blood cells and no organisms were seen. This was found to be concordant by Dr. Frederico HammanMichelle Collins. Pathology results were discussed with the patient by telephone. The patient reported doing well after the biopsy with tenderness at the sites. Post biopsy instructions and care were reviewed and questions were answered. The patient was encouraged to call [facility] for any additional concerns. She is to complete her current course of antibiotics and continue with clinical follow-up. The patient was instructed to return for right diagnostic mammography and ultrasound in 1 month and this is scheduled on July 19, 2015. Dr. Thomasena Edisollins discussed the possibility that if the mass in the subareolar right breast does not completely resolve with aspiration and antibiotics, surgical referral will be recommended for possible surgical incision and drainage to ensure complete resolution. Pathology results reported by Sonnie AlamoLeigh Kuhnly RN, BSN on 06/21/2015. Electronically Signed By: Frederico HammanMichelle Collins M.D. On: 06/21/2015 10:29  CLINICAL DATA: 60 year old female with new palpable lump in the outer right breast discovered on self-examination. Recent right retroareolar collections/ chronic inflammation biopsied and aspirated. History of left breast retroareolar abscesses and surgery.  EXAM: 2D DIGITAL DIAGNOSTIC RIGHT MAMMOGRAM WITH CAD AND ADJUNCT TOMO  ULTRASOUND RIGHT BREAST  COMPARISON: Previous  exam(s).  ACR Breast Density Category b: There are scattered areas of fibroglandular density.  FINDINGS: 2D and 3D full field views and a spot compression view of the right breast again demonstrate increased density in 2 biopsy clips in the retroareolar right breast. Overlying skin thickening is again noted.  No changes identified.  Mammographic images were processed with CAD.  On physical exam, palpable thickening at the 9 o'clock position of the right breast 5 cm from the nipple noted. Diffuse retroareolar right breast thickening is identified.  Targeted ultrasound is performed, showing a 1.5 x 0.6 x 1.6 cm complicated fluid collection at the 9 o'clock position of the right breast 5 cm from the nipple, corresponding to the patient's palpable abnormality. A persistent 2.6 x 1.4 x 2.2 cm complicated fluid collection in the upper outer retroareolar right breast is identified.  IMPRESSION: Complicated fluid collections/abscesses in the outer and retroareolar right breast as described. No evidence suggestive of malignancy.  RECOMMENDATION: The patient desire surgical consultation. An appointment with Dr. Harlon Florseui [facility] has been scheduled for 07/23/2015 and the patient informed.  I have discussed the findings and recommendations with the patient. Results were also provided in writing at the conclusion of the visit. If applicable, a reminder letter will be sent to the patient regarding the next appointment.  BI-RADS CATEGORY 2: Benign.   Electronically Signed By: Harmon PierJeffrey Hu M.D. On: 07/19/2015 11:51   Other Problems (Krystal Woodward, CMA; 07/23/2015 11:06 AM) Lump In Breast  Past Surgical History Krystal Woodward(Krystal Woodward, CMA; 07/23/2015 11:06 AM) Breast Biopsy Bilateral. Foot Surgery Left. Oral Surgery  Diagnostic Studies History Krystal Woodward(Krystal Woodward, CMA; 07/23/2015 11:06 AM) Colonoscopy 1-5 years ago Mammogram 1-3 years ago Pap Smear 1-5 years  ago  Allergies Krystal Woodward(Krystal Woodward, CMA; 07/23/2015 11:06 AM) No Known Drug Allergies 07/23/2015  Medication History (Krystal Woodward, CMA; 07/23/2015 11:07 AM) Turmeric (Curcuma Longa) Active. Biotin (1MG  Capsule, Oral) Active. Aspirin (81MG  Tablet Chewable, Oral) Active. Fish Oil + D3 (1000-1000MG -UNIT Capsule, Oral) Active. Multivitamin Adult (Oral) Active. Medications Reconciled  Pregnancy / Birth History Krystal Woodward(Krystal Woodward, CMA; 07/23/2015 11:06 AM) Age at menarche 16 years. Age of menopause 10851-55 Gravida 3 Maternal age 58-25 Para 3     Review of Systems Krystal Woodward(Krystal Woodward CMA; 07/23/2015 11:06 AM) General Not Present- Appetite Loss, Chills, Fatigue, Fever, Night Sweats, Weight Gain and Weight Loss. Skin Not Present- Change in Wart/Mole, Dryness, Hives, Jaundice, New Lesions, Non-Healing Wounds, Rash and Ulcer. HEENT Present- Wears glasses/contact lenses. Not Present- Earache, Hearing Loss, Hoarseness, Nose Bleed, Oral Ulcers, Ringing in the Ears, Seasonal Allergies, Sinus Pain, Sore Throat, Visual Disturbances and Yellow Eyes. Respiratory Not Present- Bloody sputum, Chronic Cough, Difficulty Breathing, Snoring and Wheezing. Breast Present- Breast Mass, Breast Pain and Nipple Discharge. Not Present- Skin Changes. Cardiovascular Not Present- Chest Pain, Difficulty Breathing Lying Down, Leg Cramps, Palpitations, Rapid Heart Rate, Shortness of Breath and Swelling of Extremities. Gastrointestinal Not Present- Abdominal Pain, Bloating, Bloody Stool, Change in Bowel Habits, Chronic diarrhea, Constipation, Difficulty Swallowing, Excessive gas, Gets full quickly at meals, Hemorrhoids, Indigestion, Nausea, Rectal Pain and Vomiting. Female Genitourinary Not Present- Frequency, Nocturia, Painful Urination, Pelvic Pain and Urgency. Musculoskeletal Not Present- Back Pain, Joint Pain, Joint Stiffness, Muscle Pain, Muscle Weakness and Swelling of Extremities. Neurological Not Present- Decreased Memory,  Fainting, Headaches, Numbness, Seizures, Tingling, Tremor, Trouble walking and Weakness. Psychiatric Not Present- Anxiety, Bipolar, Change in Sleep Pattern, Depression, Fearful and Frequent crying. Endocrine Not Present- Cold Intolerance, Excessive Hunger,  Hair Changes, Heat Intolerance, Hot flashes and New Diabetes. Hematology Not Present- Easy Bruising, Excessive bleeding, Gland problems, HIV and Persistent Infections.  Vitals (Krystal Woodward CMA; 07/23/2015 11:06 AM) 07/23/2015 11:06 AM Weight: 166 lb Height: 65in Body Surface Area: 1.83 m Body Mass Index: 27.62 kg/m  Pulse: 54 (Regular)  BP: 128/74 (Sitting, Left Arm, Standard)      Physical Exam Krystal Woodward K. Athony Coppa MD; 07/23/2015 11:50 AM)  The physical exam findings are as follows: Note:WDWN in NAD Left breast - well-healed areolar incision; no palpable masses; no lymphadenopathy Right breast - firm retroareolar mass measuring about 4 cm across. Just lateral to this area there is continued firm tissue measuring about 6 cm across. There is no overlying erythema. This area is mildly tender to palpation. No axillary lymphadenopathy.    Assessment & Plan Krystal Woodward. Maxime Beckner MD; 07/23/2015 11:34 AM)  ABSCESS OF BREAST, RIGHT (N61.1)  Current Plans Schedule for Surgery - Incision and drainage of chronic right breast abscess. The surgical procedure has been discussed with the patient. Potential risks, benefits, alternative treatments, and expected outcomes have been explained. All of the patient's questions at this time have been answered. The likelihood of reaching the patient's treatment goal is good. The patient understand the proposed surgical procedure and wishes to proceed.  Krystal Woodward. Corliss Skains, MD, [facility]  General/ Trauma Surgery  07/23/2015 11:50 AM

## 2015-07-28 NOTE — Anesthesia Postprocedure Evaluation (Signed)
Anesthesia Post Note  Patient: Krystal Woodward  Procedure(s) Performed: Procedure(s) (LRB): INCISION AND DRAINAGE RIGHT BREAST  ABSCESS (Right)  Patient location during evaluation: PACU Anesthesia Type: General Level of consciousness: awake and alert Pain management: pain level controlled Vital Signs Assessment: post-procedure vital signs reviewed and stable Respiratory status: spontaneous breathing, nonlabored ventilation, respiratory function stable and patient connected to nasal cannula oxygen Cardiovascular status: blood pressure returned to baseline and stable Postop Assessment: no signs of nausea or vomiting Anesthetic complications: no    Last Vitals:  Filed Vitals:   07/28/15 0953 07/28/15 1007  BP:  109/80  Pulse: 57 61  Temp:  36.4 C  Resp: 17 18    Last Pain: There were no vitals filed for this visit.               Cecile HearingStephen Edward Alicianna Litchford

## 2015-07-28 NOTE — Discharge Instructions (Signed)
[facility] Office Phone Number [phone]  BREAST ABSCESS DRAINAGE: POST OP INSTRUCTIONS  Always review your discharge instruction sheet given to you by the facility where your surgery was performed.  IF YOU HAVE DISABILITY OR FAMILY LEAVE FORMS, YOU MUST BRING THEM TO THE OFFICE FOR PROCESSING.  DO NOT GIVE THEM TO YOUR DOCTOR.  1. A prescription for pain medication may be given to you upon discharge.  Take your pain medication as prescribed, if needed.  If narcotic pain medicine is not needed, then you may take acetaminophen (Tylenol) or ibuprofen (Advil) as needed. 2. Take your usually prescribed medications unless otherwise directed 3. If you need a refill on your pain medication, please contact your pharmacy.  They will contact our office to request authorization.  Prescriptions will not be filled after 5pm or on week-ends. 4. You should eat very light the first 24 hours after surgery, such as soup, crackers, pudding, etc.  Resume your normal diet the day after surgery. 5. Most patients will experience some swelling and bruising in the breast.  Ice packs and a good support bra will help.  Swelling and bruising can take several days to resolve.  6. It is common to experience some constipation if taking pain medication after surgery.  Increasing fluid intake and taking a stool softener will usually help or prevent this problem from occurring.  A mild laxative (Milk of Magnesia or Miralax) should be taken according to package directions if there are no bowel movements after 48 hours. 7. You have two drains coming out of the incision in your breast.  Keep dry gauze taped over this area to absorb the drainage.  You may shower with the dressing in place, then change the dressing when you get out of the shower.  You will have a lot of drainage for the first couple of days, then it should gradually decrease. 8. ACTIVITIES:  You may resume regular daily activities (gradually  increasing) beginning the next day.  Wearing a good support bra or sports bra minimizes pain and swelling.  You may have sexual intercourse when it is comfortable. a. You may drive when you no longer are taking prescription pain medication, you can comfortably wear a seatbelt, and you can safely maneuver your car and apply brakes. b. RETURN TO WORK:  1-2 weeks 9. You should see your doctor in the office for a follow-up appointment approximately two weeks after your surgery.  You may call to check if you do not hear from [place] after three days. 10. OTHER INSTRUCTIONS: _______________________________________________________________________________________________ _____________________________________________________________________________________________________________________________________ _____________________________________________________________________________________________________________________________________ _____________________________________________________________________________________________________________________________________  WHEN TO CALL YOUR DOCTOR: 1. Fever over 101.0 2. Nausea and/or vomiting. 3. Extreme swelling or bruising. 4. Continued bleeding from incision. 5. Increased pain, redness, or drainage from the incision.  The clinic staff is available to answer your questions during regular business hours.  Please dont hesitate to call and ask to speak to one of the nurses for clinical concerns.  If you have a medical emergency, go to the nearest emergency room or call 911.  A surgeon from [facility] is always on call at the hospital.  For further questions, please visit [email or web address]    Post Anesthesia Home Care Instructions  Activity: Get plenty of rest for the remainder of the day. A responsible adult should stay with you for 24 hours following the procedure.  For the next 24 hours, DO NOT: -Drive a car -Advertising copywriter -Drink  alcoholic beverages -Take any medication unless instructed by your  physician -Make any legal decisions or sign important papers.  Meals: Start with liquid foods such as gelatin or soup. Progress to regular foods as tolerated. Avoid greasy, spicy, heavy foods. If nausea and/or vomiting occur, drink only clear liquids until the nausea and/or vomiting subsides. Call your physician if vomiting continues.  Special Instructions/Symptoms: Your throat may feel dry or sore from the anesthesia or the breathing tube placed in your throat during surgery. If this causes discomfort, gargle with warm salt water. The discomfort should disappear within 24 hours.  If you had a scopolamine patch placed behind your ear for the management of post- operative nausea and/or vomiting:  1. The medication in the patch is effective for 72 hours, after which it should be removed.  Wrap patch in a tissue and discard in the trash. Wash hands thoroughly with soap and water. 2. You may remove the patch earlier than 72 hours if you experience unpleasant side effects which may include dry mouth, dizziness or visual disturbances. 3. Avoid touching the patch. Wash your hands with soap and water after contact with the patch.

## 2015-07-28 NOTE — Transfer of Care (Signed)
Immediate Anesthesia Transfer of Care Note  Patient: Krystal Woodward  Procedure(s) Performed: Procedure(s): INCISION AND DRAINAGE RIGHT BREAST  ABSCESS (Right)  Patient Location: PACU  Anesthesia Type:General  Level of Consciousness: awake and patient cooperative  Airway & Oxygen Therapy: Patient Spontanous Breathing and Patient connected to face mask oxygen  Post-op Assessment: Report given to RN and Post -op Vital signs reviewed and stable  Post vital signs: Reviewed and stable  Last Vitals:  Filed Vitals:   07/28/15 0804  BP: 112/51  Pulse: 46  Temp: 36.7 C  Resp: 20    Complications: No apparent anesthesia complications

## 2015-07-29 ENCOUNTER — Encounter (HOSPITAL_BASED_OUTPATIENT_CLINIC_OR_DEPARTMENT_OTHER): Payer: Self-pay | Admitting: Surgery

## 2015-07-31 LAB — WOUND CULTURE
Culture: NO GROWTH
Gram Stain: NONE SEEN

## 2015-08-02 LAB — ANAEROBIC CULTURE: GRAM STAIN: NONE SEEN

## 2015-09-06 ENCOUNTER — Ambulatory Visit: Payer: Commercial Managed Care - PPO | Admitting: Family Medicine

## 2015-09-07 ENCOUNTER — Encounter: Payer: Self-pay | Admitting: Family Medicine

## 2015-09-07 ENCOUNTER — Ambulatory Visit (INDEPENDENT_AMBULATORY_CARE_PROVIDER_SITE_OTHER): Payer: Commercial Managed Care - PPO | Admitting: Family Medicine

## 2015-09-07 VITALS — BP 110/66 | HR 54 | Temp 98.0°F | Resp 16 | Wt 165.0 lb

## 2015-09-07 DIAGNOSIS — R7303 Prediabetes: Secondary | ICD-10-CM | POA: Diagnosis not present

## 2015-09-07 LAB — POCT GLYCOSYLATED HEMOGLOBIN (HGB A1C)
ESTIMATED AVERAGE GLUCOSE: 120
Hemoglobin A1C: 5.8

## 2015-09-07 NOTE — Progress Notes (Signed)
       Patient: Krystal Woodward Female    DOB: Jan 21, 1956   60 y.o.   MRN: [id number] Visit Date: 09/07/2015  Today's Provider: Mila Merryonald Fisher, MD   Chief Complaint  Patient presents with  . Hyperglycemia    follow up   Subjective:    HPI  Prediabetes, Follow-up:   Lab Results  Component Value Date   HGBA1C 5.7 04/08/2015   HGBA1C 6.2* 01/04/2015   HGBA1C 5.5 07/23/2012   GLUCOSE 87 01/04/2015    Last seen for for this5 months ago.  Management since that visit includes advising patient to continue diet and exercise. Current symptoms include none and have been stable.  Weight trend: decreasing steadily Prior visit with dietician: no Current diet: in general, a "healthy" diet   Current exercise: walking and weightlifting  Pertinent Labs:    Component Value Date/Time   CHOL 165 01/04/2015 1152   TRIG 68 01/04/2015 1152   CHOLHDL 3.7 01/04/2015 1152   CREATININE 0.89 01/04/2015 1152    Wt Readings from Last 3 Encounters:  09/07/15 165 lb (74.844 kg)  07/28/15 167 lb (75.751 kg)  06/07/15 169 lb (76.658 kg)       No Known Allergies Previous Medications   ASPIRIN 81 MG TABLET    Take 81 mg by mouth daily.   CHOLECALCIFEROL (D-3-5) 5000 UNITS CAPSULE    Take 10,000 Units by mouth daily.    HYDROCODONE-ACETAMINOPHEN (NORCO/VICODIN) 5-325 MG TABLET    Take 1 tablet by mouth every 4 (four) hours as needed.   MULTIPLE VITAMINS-MINERALS (CENTRUM SILVER ULTRA WOMENS PO)    Take 1 capsule by mouth daily.     OMEGA-3 FATTY ACIDS (FISH OIL PO)    Take 1,500 Units by mouth.     TURMERIC CURCUMIN PO    Take 1 tablet by mouth daily.    Review of Systems  Constitutional: Negative for fever, chills, appetite change and fatigue.  Respiratory: Negative for chest tightness and shortness of breath.   Cardiovascular: Negative for chest pain and palpitations.  Gastrointestinal: Negative for nausea, vomiting and abdominal pain.  Neurological: Negative for dizziness and weakness.      Social History  Substance Use Topics  . Smoking status: Never Smoker   . Smokeless tobacco: Never Used  . Alcohol Use: No   Objective:   BP 110/66 mmHg  Pulse 54  Temp(Src) 98 F (36.7 C) (Oral)  Resp 16  Wt 165 lb (74.844 kg)  SpO2 98%  Physical Exam  General appearance: alert, well developed, well nourished, cooperative and in no distress Head: Normocephalic, without obvious abnormality, atraumatic Lungs: Respirations even and unlabored    Results for orders placed or performed in visit on 09/07/15  POCT HgB A1C  Result Value Ref Range   Hemoglobin A1C 5.8    Est. average glucose Bld gHb Est-mCnc 120        Assessment & Plan:     1. Prediabetes Doing well with diet and exercise which she will continue. Follow up in September for A1c and labs.  - POCT HgB A1C   The entirety of the information documented in the History of Present Illness, Review of Systems and Physical Exam were personally obtained by me. Portions of this information were initially documented by Awilda Billoshena Chambers, CMA and reviewed by me for thoroughness and accuracy.        Mila Merryonald Fisher, MD  [facility]

## 2015-10-15 ENCOUNTER — Encounter: Payer: Self-pay | Admitting: Family Medicine

## 2015-10-15 ENCOUNTER — Ambulatory Visit (INDEPENDENT_AMBULATORY_CARE_PROVIDER_SITE_OTHER): Payer: Commercial Managed Care - PPO | Admitting: Family Medicine

## 2015-10-15 VITALS — BP 102/62 | HR 68 | Temp 98.5°F | Resp 16 | Wt 165.0 lb

## 2015-10-15 DIAGNOSIS — L03119 Cellulitis of unspecified part of limb: Secondary | ICD-10-CM

## 2015-10-15 MED ORDER — CEPHALEXIN 500 MG PO CAPS: 500.0000 mg | ORAL_CAPSULE | Freq: Four times a day (QID) | ORAL | Status: AC

## 2015-10-15 NOTE — Progress Notes (Signed)
       Patient: Krystal Woodward Female    DOB: 11-19-1955   [age] y.o.   MRN: [id number] Visit Date: 10/15/2015  Today's Provider: Mila Merryonald Fisher, MD   Chief Complaint  Patient presents with  . Insect Bite    Wasp sting   Subjective:    HPI Insect Sting:  Patient states she was possibly stung by a wasp on on her lower legs, 2 days ago while cutting grass. Patient now has swelling, pain and redness in her lower legs. Patient has tried rubbing Vinegar on the bites,  taking oral benadryl and Tylenol with no relief. Patient states he legs are warm to touch.     No Known Allergies Current Meds  Medication Sig  . aspirin 81 MG tablet Take 81 mg by mouth daily.  . Cholecalciferol (D-3-5) 5000 UNITS capsule Take 10,000 Units by mouth daily.   . Multiple Vitamins-Minerals (CENTRUM SILVER ULTRA WOMENS PO) Take 1 capsule by mouth daily.    . Omega-3 Fatty Acids (FISH OIL PO) Take 1,500 Units by mouth.    . TURMERIC CURCUMIN PO Take 1 tablet by mouth daily.    Review of Systems  Constitutional: Negative for fever, chills, appetite change and fatigue.  Respiratory: Negative for chest tightness and shortness of breath.   Cardiovascular: Positive for leg swelling (lower legs). Negative for chest pain and palpitations.  Gastrointestinal: Negative for nausea, vomiting and abdominal pain.  Musculoskeletal: Positive for myalgias (lower legs).  Skin: Positive for color change (redness of lower legs) and wound.  Neurological: Negative for dizziness and weakness.    Social History  Substance Use Topics  . Smoking status: Never Smoker   . Smokeless tobacco: Never Used  . Alcohol Use: No   Objective:   BP 102/62 mmHg  Pulse 68  Temp(Src) 98.5 F (36.9 C) (Oral)  Resp 16  Wt 165 lb (74.844 kg)  SpO2 97%  Physical Exam  Small area of well circumscribed red blanching erythema right popliteal fossae    Assessment & Plan:     1. Cellulitis of lower extremity, unspecified laterality  -  cephALEXin (KEFLEX) 500 MG capsule; Take 1 capsule (500 mg total) by mouth 4 (four) times daily.  Dispense: 28 capsule; Refill: 0     The entirety of the information documented in the History of Present Illness, Review of Systems and Physical Exam were personally obtained by me. Portions of this information were initially documented by Awilda Billoshena Chambers, CMA and reviewed by me for thoroughness and accuracy.    Mila Merryonald Fisher, MD  [facility]

## 2015-10-15 NOTE — Patient Instructions (Signed)
Hydrocortisone 1% cream at least twice a day until itching is gone.

## 2016-01-10 ENCOUNTER — Other Ambulatory Visit: Payer: Self-pay | Admitting: Family Medicine

## 2016-01-10 ENCOUNTER — Ambulatory Visit (INDEPENDENT_AMBULATORY_CARE_PROVIDER_SITE_OTHER): Payer: Commercial Managed Care - PPO | Admitting: Family Medicine

## 2016-01-10 ENCOUNTER — Encounter: Payer: Self-pay | Admitting: Family Medicine

## 2016-01-10 VITALS — BP 110/70 | HR 53 | Temp 98.4°F | Resp 16 | Ht 65.0 in | Wt 164.0 lb

## 2016-01-10 DIAGNOSIS — Z124 Encounter for screening for malignant neoplasm of cervix: Secondary | ICD-10-CM | POA: Diagnosis not present

## 2016-01-10 DIAGNOSIS — Z780 Asymptomatic menopausal state: Secondary | ICD-10-CM

## 2016-01-10 DIAGNOSIS — R7303 Prediabetes: Secondary | ICD-10-CM

## 2016-01-10 DIAGNOSIS — E663 Overweight: Secondary | ICD-10-CM | POA: Diagnosis not present

## 2016-01-10 DIAGNOSIS — Z1239 Encounter for other screening for malignant neoplasm of breast: Secondary | ICD-10-CM | POA: Diagnosis not present

## 2016-01-10 DIAGNOSIS — Z01419 Encounter for gynecological examination (general) (routine) without abnormal findings: Secondary | ICD-10-CM

## 2016-01-10 NOTE — Progress Notes (Signed)
Patient: Krystal Woodward, Female    DOB: [birth year], 60 y.o.   MRN: [id number] Visit Date: 01/10/2016  Today's Provider: [name], MD   Chief Complaint  Patient presents with  . Annual Exam  . Obesity   Subjective:    Annual physical exam Krystal Woodward is a 59 y.o. female who presents today for health maintenance and complete physical. She feels well. She reports exercising daily. She reports she is sleeping well.  ----------------------------------------------------------------    Prediabetes: From 09/07/2015-Doing well with diet and exercise which she will continue. Follow up in September for A1c and labs.      Review of Systems  All other systems reviewed and are negative.   Social History      She  reports that she has never smoked. She has never used smokeless tobacco. She reports that she does not drink alcohol or use drugs.       Social History   Social History  . Marital status: Married    Spouse name: N/A  . Number of children: 4  . Years of education: N/A   Occupational History  . CNA      [employer]   Social History Main Topics  . Smoking status: Never Smoker  . Smokeless tobacco: Never Used  . Alcohol use No  . Drug use: No  . Sexual activity: Not Asked   Other Topics Concern  . None   Social History Narrative  . None    Past Medical History:  Diagnosis Date  . Bradycardia    mild  . Breast mass 01/24/2011  . Chest pain   . GERD (gastroesophageal reflux disease)   . Hyperprolactinemia (HCC) [id number]   Normal MRI  . Vitamin D deficiency      Patient Active Problem List   Diagnosis Date Noted  . Pre-diabetes 01/06/2015  . Bilateral knee pain 01/04/2015  . Obesity 01/04/2015  . Hyperprolactinemia (HCC) 01/04/2015  . Fatigue 01/04/2015  . Family history of diabetes mellitus 01/04/2015  . Family history of colon cancer 01/04/2015  . MURMUR 06/30/2010  . CHEST PAIN UNSPECIFIED 06/30/2010  . ABNORMAL  ELECTROCARDIOGRAM 06/30/2010    Past Surgical History:  Procedure Laterality Date  . BREAST BIOPSY Right 06/17/15   Inflammed Fibrocyst  . BREAST CYST EXCISION  02/27/2011   Procedure: CYST EXCISION BREAST;  Surgeon: Currie Paris, MD;  Location: [facility];  Service: General;  Laterality: Left;  Needle localization removal left breast mass  . BUNIONECTOMY     both feet  . INCISION AND DRAINAGE ABSCESS Right 07/28/2015   Procedure: INCISION AND DRAINAGE RIGHT BREAST  ABSCESS;  Surgeon: Manus Rudd, MD;  Location: [facility];  Service: General;  Laterality: Right;  . TUBAL LIGATION  1996    Family History        Family Status  Relation Status  . Mother Deceased at age 107  . Father Deceased at age 48's  . Sister   . Paternal Grandmother         Her family history includes Asthma in her paternal grandmother; Cancer in her mother; Cancer (age of onset: 3) in her sister; Diabetes in her father and paternal grandmother; Transient ischemic attack in her mother.    No Known Allergies  Current Meds  Medication Sig  . aspirin 81 MG tablet Take 81 mg by mouth daily.  . Cholecalciferol (D-3-5) 5000 UNITS capsule Take 10,000 Units by mouth daily.   Marland Kitchen  Multiple Vitamins-Minerals (CENTRUM SILVER ULTRA WOMENS PO) Take 1 capsule by mouth daily.    . Omega-3 Fatty Acids (FISH OIL PO) Take 1,500 Units by mouth.    . TURMERIC CURCUMIN PO Take 1 tablet by mouth daily.    Patient Care Team: Malva Limes, MD as PCP - General (Family Medicine)     Objective:   Vitals: BP 110/70 (BP Location: Right Arm, Patient Position: Sitting, Cuff Size: Normal)   Pulse (!) 53   Temp 98.4 F (36.9 C) (Oral)   Resp 16   Ht 5\' 5"  (1.651 m)   Wt 164 lb (74.4 kg)   SpO2 97%   BMI 27.29 kg/m    Physical Exam   General Appearance:    Alert, cooperative, no distress, appears stated age  Head:    Normocephalic, without obvious abnormality, atraumatic  Eyes:    PERRL,  conjunctiva/corneas clear, EOM's intact, fundi    benign, both eyes  Ears:    Normal TM's and external ear canals, both ears  Nose:   Nares normal, septum midline, mucosa normal, no drainage    or sinus tenderness  Throat:   Lips, mucosa, and tongue normal; teeth and gums normal  Neck:   Supple, symmetrical, trachea midline, no adenopathy;    thyroid:  no enlargement/tenderness/nodules; no carotid   bruit or JVD  Back:     Symmetric, no curvature, ROM normal, no CVA tenderness  Lungs:     Clear to auscultation bilaterally, respirations unlabored  Chest Wall:    No tenderness or deformity   Heart:    Regular rate and rhythm, S1 and S2 normal, no murmur, rub   or gallop  Breast Exam:    normal appearance, no masses or tenderness  Abdomen:     Soft, non-tender, bowel sounds active all four quadrants,    no masses, no organomegaly  Pelvic:    cervix normal in appearance, external genitalia normal and vagina normal without discharge  Extremities:   Extremities normal, atraumatic, no cyanosis or edema  Pulses:   2+ and symmetric all extremities  Skin:   Skin color, texture, turgor normal, no rashes or lesions  Lymph nodes:   Cervical, supraclavicular, and axillary nodes normal  Neurologic:   CNII-XII intact, normal strength, sensation and reflexes    throughout    Depression Screen PHQ 2/9 Scores 01/10/2016 01/04/2015  PHQ - 2 Score 0 0  PHQ- 9 Score 0 0      Assessment & Plan:     Routine Health Maintenance and Physical Exam  Exercise Activities and Dietary recommendations Goals    None      Immunization History  Administered Date(s) Administered  . Tdap 01/28/2010    Health Maintenance  Topic Date Due  . Hepatitis C Screening  1958/04/24  . HIV Screening  06/04/1972  . INFLUENZA VACCINE  02/09/2016 (Originally 11/23/2015)  . MAMMOGRAM  06/16/2017  . PAP SMEAR  01/03/2018  . TETANUS/TDAP  01/29/2020  . COLONOSCOPY  06/01/2024      Discussed health benefits of  physical activity, and encouraged her to engage in regular exercise appropriate for her age and condition.    -------------------------------------------------------------------- 1. Well woman exam with routine gynecological exam  - Lipid panel - Comprehensive metabolic panel - Cytology - PAP  2. Overweight  Doing well with diet and exercise.   3. Prediabetes  - Hemoglobin A1c  4. Screening for cervical cancer  - Cytology - PAP  5. Screening for breast  cancer Given contact information to schedule mammogram.    The entirety of the information documented in the History of Present Illness, Review of Systems and Physical Exam were personally obtained by me. Portions of this information were initially documented by April M. Hyacinth MeekerMiller, CMA and reviewed by me for thoroughness and accuracy.    Mila Merryonald Fisher, MD  [facility]

## 2016-01-11 LAB — COMPREHENSIVE METABOLIC PANEL
A/G RATIO: 1.5 (ref 1.2–2.2)
ALT: 14 IU/L (ref 0–32)
AST: 19 IU/L (ref 0–40)
Albumin: 4.4 g/dL (ref 3.5–5.5)
Alkaline Phosphatase: 120 IU/L — ABNORMAL HIGH (ref 39–117)
BILIRUBIN TOTAL: 0.4 mg/dL (ref 0.0–1.2)
BUN/Creatinine Ratio: 20 (ref 9–23)
BUN: 19 mg/dL (ref 6–24)
CHLORIDE: 104 mmol/L (ref 96–106)
CO2: 24 mmol/L (ref 18–29)
Calcium: 9.9 mg/dL (ref 8.7–10.2)
Creatinine, Ser: 0.94 mg/dL (ref 0.57–1.00)
GFR calc Af Amer: 77 mL/min/{1.73_m2} (ref >59)
GFR calc non Af Amer: 67 mL/min/{1.73_m2} (ref >59)
Globulin, Total: 2.9 g/dL (ref 1.5–4.5)
Glucose: 97 mg/dL (ref 65–99)
POTASSIUM: 4.2 mmol/L (ref 3.5–5.2)
Sodium: 143 mmol/L (ref 134–144)
Total Protein: 7.3 g/dL (ref 6.0–8.5)

## 2016-01-11 LAB — LIPID PANEL
CHOL/HDL RATIO: 3 ratio (ref 0.0–4.4)
Cholesterol, Total: 155 mg/dL (ref 100–199)
HDL: 52 mg/dL (ref >39)
LDL CALC: 93 mg/dL (ref 0–99)
TRIGLYCERIDES: 51 mg/dL (ref 0–149)
VLDL Cholesterol Cal: 10 mg/dL (ref 5–40)

## 2016-01-11 LAB — HEMOGLOBIN A1C
ESTIMATED AVERAGE GLUCOSE: 111 mg/dL
HEMOGLOBIN A1C: 5.5 % (ref 4.8–5.6)

## 2016-01-14 DIAGNOSIS — Z780 Asymptomatic menopausal state: Secondary | ICD-10-CM | POA: Insufficient documentation

## 2016-01-15 LAB — PAP LB (LIQUID-BASED): PAP Smear Comment: 0

## 2016-01-15 LAB — AMBIGUOUS MONOLAYER PAP

## 2016-01-21 ENCOUNTER — Telehealth: Payer: Self-pay

## 2016-01-21 NOTE — Telephone Encounter (Signed)
-----   Message from Malva Limesonald E Fisher, MD sent at 01/21/2016  8:31 AM EDT ----- Pap test is normal. Current recommendations are to repeat in 3 years.

## 2016-01-21 NOTE — Telephone Encounter (Signed)
Patient was advised. KW 

## 2016-10-23 ENCOUNTER — Encounter: Payer: Self-pay | Admitting: Family Medicine

## 2016-10-23 ENCOUNTER — Ambulatory Visit (INDEPENDENT_AMBULATORY_CARE_PROVIDER_SITE_OTHER): Payer: Commercial Managed Care - PPO | Admitting: Family Medicine

## 2016-10-23 VITALS — BP 112/68 | HR 66 | Temp 98.2°F | Resp 16 | Wt 178.0 lb

## 2016-10-23 DIAGNOSIS — M25562 Pain in left knee: Secondary | ICD-10-CM

## 2016-10-23 DIAGNOSIS — G459 Transient cerebral ischemic attack, unspecified: Secondary | ICD-10-CM

## 2016-10-23 DIAGNOSIS — R202 Paresthesia of skin: Secondary | ICD-10-CM | POA: Diagnosis not present

## 2016-10-23 DIAGNOSIS — M62838 Other muscle spasm: Secondary | ICD-10-CM

## 2016-10-23 DIAGNOSIS — M25561 Pain in right knee: Secondary | ICD-10-CM | POA: Diagnosis not present

## 2016-10-23 DIAGNOSIS — R200 Anesthesia of skin: Secondary | ICD-10-CM | POA: Diagnosis not present

## 2016-10-23 NOTE — Progress Notes (Signed)
Patient: Krystal Woodward Female    DOB: 23-Aug-1955   61 y.o.   MRN: [id number] Visit Date: 10/23/2016  Today's Provider: [name], MD   Chief Complaint  Patient presents with  . Numbness   Subjective:    HPI  Pt is here today for numbness/tingling in her right arm. Located in her arm pt and down the inside of her arm to her fingers. She reports that she has a hard time hold things in her hands and using them. She does a lot of repetitive motions with her hands and has been told in the past that she has tendonitis in the wrist. This has been occurring for the past week. This morning the numbness/tingling feeling traveled into the other arm. She also has been having calf muscle and hamstring cramps while she was laying down for the last month. No chest pains or shortness of breath.       No Known Allergies   Current Outpatient Prescriptions:  .  aspirin 81 MG tablet, Take 81 mg by mouth daily., Disp: , Rfl:  .  Cholecalciferol (D-3-5) 5000 UNITS capsule, Take 10,000 Units by mouth daily. , Disp: , Rfl:  .  Multiple Vitamins-Minerals (CENTRUM SILVER ULTRA WOMENS PO), Take 1 capsule by mouth daily.  , Disp: , Rfl:  .  Omega-3 Fatty Acids (FISH OIL PO), Take 1,500 Units by mouth.  , Disp: , Rfl:  .  TURMERIC CURCUMIN PO, Take 1 tablet by mouth daily., Disp: , Rfl:   Review of Systems  Constitutional: Negative.   HENT: Negative.   Eyes: Negative.   Respiratory: Negative.   Cardiovascular: Negative.   Gastrointestinal: Negative.   Endocrine: Negative.   Genitourinary: Negative.   Musculoskeletal: Positive for myalgias.  Skin: Negative.   Allergic/Immunologic: Negative.   Neurological: Positive for numbness.  Hematological: Negative.   Psychiatric/Behavioral: Negative.     Social History  Substance Use Topics  . Smoking status: Never Smoker  . Smokeless tobacco: Never Used  . Alcohol use No   Objective:   BP 112/68 (BP Location: Left Arm, Patient Position:  Sitting, Cuff Size: Normal)   Pulse 66   Temp 98.2 F (36.8 C) (Oral)   Resp 16   Wt 178 lb (80.7 kg)   SpO2 98%   BMI 29.62 kg/m  Vitals:   10/23/16 1419  BP: 112/68  Pulse: 66  Resp: 16  Temp: 98.2 F (36.8 C)  TempSrc: Oral  SpO2: 98%  Weight: 178 lb (80.7 kg)     Physical Exam   General Appearance:    Alert, cooperative, no distress  Eyes:    PERRL, conjunctiva/corneas clear, EOM's intact       Lungs:     Clear to auscultation bilaterally, respirations unlabored  Heart:    Regular rate and rhythm  Neurologic:   Awake, alert, oriented x 3. No apparent focal neurological           defect.   MS:   Negative Tinels and Phalens. FROM wrist arm and shoulder. Mild tenderness with palpation of posterior axialla. Bilateral crepitus of knees, R>L       Assessment & Plan:     1. Numbness and tingling of right arm Unclear if peripheral or central. Check labs.  - Comprehensive metabolic panel - Magnesium - Vitamin B12 - VITAMIN D 25 Hydroxy (Vit-D Deficiency, Fractures)  2. Arthralgia of both knees  - Ambulatory referral to Orthopedic Surgery  3. Muscle spasm  -  Comprehensive metabolic panel - Magnesium - Vitamin B12  4. Transient cerebral ischemia, unspecified type(rule out)  - US Carotid Duplex Bilateral; Future       Mila Merryonald Doneta Bayman, MD  [facility]

## 2016-10-24 LAB — VITAMIN D 25 HYDROXY (VIT D DEFICIENCY, FRACTURES): VIT D 25 HYDROXY: 32.6 ng/mL (ref 30.0–100.0)

## 2016-10-24 LAB — MAGNESIUM: Magnesium: 2 mg/dL (ref 1.6–2.3)

## 2016-10-24 LAB — COMPREHENSIVE METABOLIC PANEL
A/G RATIO: 1.5 (ref 1.2–2.2)
ALT: 22 IU/L (ref 0–32)
AST: 28 IU/L (ref 0–40)
Albumin: 4.5 g/dL (ref 3.5–5.5)
Alkaline Phosphatase: 149 IU/L — ABNORMAL HIGH (ref 39–117)
BUN/Creatinine Ratio: 20 (ref 9–23)
BUN: 19 mg/dL (ref 6–24)
Bilirubin Total: 0.2 mg/dL (ref 0.0–1.2)
CALCIUM: 9.8 mg/dL (ref 8.7–10.2)
CO2: 26 mmol/L (ref 20–29)
CREATININE: 0.94 mg/dL (ref 0.57–1.00)
Chloride: 102 mmol/L (ref 96–106)
GFR, EST AFRICAN AMERICAN: 77 mL/min/{1.73_m2} (ref >59)
GFR, EST NON AFRICAN AMERICAN: 67 mL/min/{1.73_m2} (ref >59)
Globulin, Total: 3 g/dL (ref 1.5–4.5)
Glucose: 86 mg/dL (ref 65–99)
Potassium: 4 mmol/L (ref 3.5–5.2)
Sodium: 144 mmol/L (ref 134–144)
TOTAL PROTEIN: 7.5 g/dL (ref 6.0–8.5)

## 2016-10-24 LAB — VITAMIN B12: VITAMIN B 12: 1321 pg/mL — AB (ref 232–1245)

## 2016-10-27 ENCOUNTER — Telehealth: Payer: Self-pay | Admitting: Family Medicine

## 2016-10-27 NOTE — Telephone Encounter (Signed)
Tried calling patient. Left message to call back. 

## 2016-10-27 NOTE — Telephone Encounter (Signed)
Patient states that she was scheduled to have a Carotid ultrasound but cancelled it due to the cost. Patient wants to know why this needs to be done if her numbness is in her lower extremities. She states she wants to make sure this is necessary because it is very expensive.

## 2016-10-27 NOTE — Telephone Encounter (Signed)
Pt states she has a question about the ultrasound that is scheduled. CB#[phone]/MW

## 2016-10-30 ENCOUNTER — Ambulatory Visit: Payer: Commercial Managed Care - PPO

## 2016-10-31 NOTE — Telephone Encounter (Signed)
Advised patient as below. Patient reports that her symptoms are infrequent now, and she will keep a log on when it happens and how long it lasts. Encouraged the patient to have this done, but patient declined due to cost. She says that her insurance would only cover $200 and she would have to pay $500 out of pocket the day the US was scheduled. Patient would still like to hold off for now.

## 2016-10-31 NOTE — Telephone Encounter (Signed)
Her symptoms were suspicious for a mini-stroke which is usually caused by restricted blood flow to brain through the arteries in her neck. She should have this done and can have it rescheduled if she agrees.

## 2017-01-16 ENCOUNTER — Ambulatory Visit (INDEPENDENT_AMBULATORY_CARE_PROVIDER_SITE_OTHER): Payer: Commercial Managed Care - PPO | Admitting: Family Medicine

## 2017-01-16 ENCOUNTER — Telehealth: Payer: Self-pay | Admitting: Family Medicine

## 2017-01-16 ENCOUNTER — Encounter: Payer: Self-pay | Admitting: Family Medicine

## 2017-01-16 VITALS — BP 122/70 | Temp 98.3°F | Resp 16 | Wt 176.0 lb

## 2017-01-16 DIAGNOSIS — Z0000 Encounter for general adult medical examination without abnormal findings: Secondary | ICD-10-CM | POA: Diagnosis not present

## 2017-01-16 DIAGNOSIS — Z124 Encounter for screening for malignant neoplasm of cervix: Secondary | ICD-10-CM | POA: Diagnosis not present

## 2017-01-16 DIAGNOSIS — Z1159 Encounter for screening for other viral diseases: Secondary | ICD-10-CM | POA: Diagnosis not present

## 2017-01-16 DIAGNOSIS — Z1231 Encounter for screening mammogram for malignant neoplasm of breast: Secondary | ICD-10-CM | POA: Diagnosis not present

## 2017-01-16 DIAGNOSIS — R7303 Prediabetes: Secondary | ICD-10-CM | POA: Diagnosis not present

## 2017-01-16 DIAGNOSIS — Z1239 Encounter for other screening for malignant neoplasm of breast: Secondary | ICD-10-CM

## 2017-01-16 DIAGNOSIS — Z23 Encounter for immunization: Secondary | ICD-10-CM | POA: Diagnosis not present

## 2017-01-16 NOTE — Progress Notes (Signed)
Patient: Krystal Woodward, Female    DOB: May 19, 1955, 61 y.o.   MRN: [id number] Visit Date: 01/16/2017  Today's Provider: [name], MD   Chief Complaint  Patient presents with  . Annual Exam  . Hyperglycemia   Subjective:    Annual physical exam Krystal Woodward is a 61 y.o. female who presents today for health maintenance and complete physical. She feels well. She reports exercising not regularly, but she does stay active.  She reports she is sleeping well.  Mammogram- 07/19/2015. Normal. Pap- 01/10/2016. Normal. Repeat in 3 yrs. Colonoscopy- 06/01/2014. Internal hemorrhoids.  Tdap- 01/28/2010.    Prediabetes, Follow-up:   Lab Results  Component Value Date   HGBA1C 5.5 01/10/2016   HGBA1C 5.8 09/07/2015   HGBA1C 5.7 04/08/2015   GLUCOSE 86 10/23/2016   GLUCOSE 97 01/10/2016   GLUCOSE 87 01/04/2015    Last seen for for this1 years ago.  Management since that visit includes no changes. Current symptoms include none and have been stable.  Weight trend: stable Prior visit with dietician: no Current diet: well balanced Current exercise: none  Pertinent Labs:    Component Value Date/Time   CHOL 155 01/10/2016 1023   TRIG 51 01/10/2016 1023   CHOLHDL 3.0 01/10/2016 1023   CREATININE 0.94 10/23/2016 1505    Wt Readings from Last 3 Encounters:  01/16/17 176 lb (79.8 kg)  10/23/16 178 lb (80.7 kg)  01/10/16 164 lb (74.4 kg)     Review of Systems  Constitutional: Negative.   HENT: Negative.   Eyes: Negative.   Respiratory: Negative.   Cardiovascular: Negative.   Gastrointestinal: Negative.   Endocrine: Negative.   Genitourinary: Negative.   Musculoskeletal: Negative.   Skin: Negative.   Allergic/Immunologic: Negative.   Neurological: Negative.   Hematological: Negative.   Psychiatric/Behavioral: Negative.     Social History      She  reports that she has never smoked. She has never used smokeless tobacco. She reports that she does not drink  alcohol or use drugs.       Social History   Social History  . Marital status: Married    Spouse name: N/A  . Number of children: 4  . Years of education: N/A   Occupational History  . CNA      [employer]   Social History Main Topics  . Smoking status: Never Smoker  . Smokeless tobacco: Never Used  . Alcohol use No  . Drug use: No  . Sexual activity: Not Asked   Other Topics Concern  . None   Social History Narrative  . None    Past Medical History:  Diagnosis Date  . Bradycardia    mild  . Breast mass 01/24/2011  . Chest pain   . GERD (gastroesophageal reflux disease)   . Hyperprolactinemia (HCC) [id number]   Normal MRI  . Vitamin D deficiency      Patient Active Problem List   Diagnosis Date Noted  . Post-menopausal 01/14/2016  . Pre-diabetes 01/06/2015  . Bilateral knee pain 01/04/2015  . Overweight 01/04/2015  . Hyperprolactinemia (HCC) 01/04/2015  . Fatigue 01/04/2015  . Family history of diabetes mellitus 01/04/2015  . Family history of colon cancer 01/04/2015  . MURMUR 06/30/2010    Past Surgical History:  Procedure Laterality Date  . BREAST BIOPSY Right 06/17/15   Inflammed Fibrocyst  . BREAST CYST EXCISION  02/27/2011   Procedure: CYST EXCISION BREAST;  Surgeon: Currie Paris, MD;  Location: [facility];  Service: General;  Laterality: Left;  Needle localization removal left breast mass  . BUNIONECTOMY     both feet  . INCISION AND DRAINAGE ABSCESS Right 07/28/2015   Procedure: INCISION AND DRAINAGE RIGHT BREAST  ABSCESS;  Surgeon: Manus Rudd, MD;  Location: [facility];  Service: General;  Laterality: Right;  . TUBAL LIGATION  1996    Family History        Family Status  Relation Status  . Mother Deceased at age 20  . Father Deceased at age 51's  . Sister (Not Specified)  . PGM (Not Specified)        Her family history includes Asthma in her paternal grandmother; Cancer in her mother; Cancer (age of  onset: 60) in her sister; Diabetes in her father and paternal grandmother; Transient ischemic attack in her mother.     No Known Allergies   Current Outpatient Prescriptions:  .  aspirin 81 MG tablet, Take 81 mg by mouth daily., Disp: , Rfl:  .  Cholecalciferol (D-3-5) 5000 UNITS capsule, Take 10,000 Units by mouth daily. , Disp: , Rfl:  .  Multiple Vitamins-Minerals (CENTRUM SILVER ULTRA WOMENS PO), Take 1 capsule by mouth daily.  , Disp: , Rfl:  .  Omega-3 Fatty Acids (FISH OIL PO), Take 1,500 Units by mouth.  , Disp: , Rfl:  .  TURMERIC CURCUMIN PO, Take 1 tablet by mouth daily., Disp: , Rfl:    Patient Care Team: Malva Limes, MD as PCP - General (Family Medicine)      Objective:   Vitals: BP 122/70   Temp 98.3 F (36.8 C)   Resp 16   Wt 176 lb (79.8 kg)   BMI 29.29 kg/m    Vitals:   01/16/17 1535  BP: 122/70  Resp: 16  Temp: 98.3 F (36.8 C)  Weight: 176 lb (79.8 kg)     Physical Exam  Constitutional: She is oriented to person, place, and time. She appears well-developed and well-nourished.  HENT:  Head: Normocephalic and atraumatic.  Right Ear: External ear normal.  Left Ear: External ear normal.  Nose: Nose normal.  Mouth/Throat: Oropharynx is clear and moist.  Eyes: Pupils are equal, round, and reactive to light. Conjunctivae and EOM are normal.  Neck: Normal range of motion. Neck supple.  Cardiovascular: Normal rate, regular rhythm and normal heart sounds.   Pulmonary/Chest: Effort normal and breath sounds normal. Right breast exhibits no inverted nipple, no mass, no nipple discharge, no skin change and no tenderness. Left breast exhibits no inverted nipple, no mass, no nipple discharge, no skin change and no tenderness. Breasts are symmetrical.  Abdominal: Soft.  Genitourinary: Vagina normal.  Neurological: She is alert and oriented to person, place, and time.  Skin: Skin is warm.  Psychiatric: She has a normal mood and affect. Her behavior is normal.  Judgment and thought content normal.     Depression Screen PHQ 2/9 Scores 01/16/2017 01/10/2016 01/04/2015  PHQ - 2 Score 0 0 0  PHQ- 9 Score - 0 0      Assessment & Plan:     Routine Health Maintenance and Physical Exam  Exercise Activities and Dietary recommendations Goals    None      Immunization History  Administered Date(s) Administered  . Tdap 01/28/2010    Health Maintenance  Topic Date Due  . Hepatitis C Screening  02-14-1956  . HIV Screening  06/04/1972  . INFLUENZA VACCINE  11/22/2016  .  MAMMOGRAM  06/16/2017  . PAP SMEAR  01/10/2019  . TETANUS/TDAP  01/29/2020  . COLONOSCOPY  06/01/2024     Discussed health benefits of physical activity, and encouraged her to engage in regular exercise appropriate for her age and condition.    1. Annual physical exam   2. Pre-diabetes   3. Influenza vaccine needed  - Flu Vaccine QUAD 6+ mos PF IM (Fluarix Quad PF)  4. Need for hepatitis C screening test  - Hepatitis C antibody  5. . Cervical cancer screening Counseled regarding current recommendations for Q3 year pap testing or Q5year co-testing. She is very concerned about cancer due to family history and wants to continue annual pap tests.  - Pap IG (Image Guided)  6. Breast cancer screening  - MM Digital Screening; Future     [name], MD  [facility]

## 2017-01-16 NOTE — Telephone Encounter (Signed)
Pt states that you mentioned getting mammogram scheduled at [facility].There is no order in [facility]

## 2017-01-17 ENCOUNTER — Encounter: Payer: Self-pay | Admitting: Family Medicine

## 2017-01-17 LAB — PAP IG (IMAGE GUIDED)

## 2017-01-17 LAB — HEMOGLOBIN A1C
EAG (MMOL/L): 6.5 (calc)
Hgb A1c MFr Bld: 5.7 % of total Hgb — ABNORMAL HIGH (ref <5.7)
MEAN PLASMA GLUCOSE: 117 (calc)

## 2017-01-17 LAB — HEPATITIS C ANTIBODY
Hepatitis C Ab: NONREACTIVE
SIGNAL TO CUT-OFF: 0.11 (ref <1.00)

## 2017-01-17 NOTE — Telephone Encounter (Signed)
Patient was notified of results.  

## 2017-01-17 NOTE — Telephone Encounter (Signed)
Patient is requesting results from yesterday. Please advise?

## 2017-01-17 NOTE — Telephone Encounter (Signed)
I thought she was going to call [place], but is OK to order in [place] if she prefers.

## 2017-01-17 NOTE — Telephone Encounter (Signed)
Last mammogram 07/19/15 okay to place order in chart for mammogram? KW

## 2017-01-17 NOTE — Telephone Encounter (Signed)
Every thing is normal. Message was sent to her through Mychart

## 2017-01-17 NOTE — Telephone Encounter (Signed)
Patient was notified order for mm is in epic.

## 2017-01-18 ENCOUNTER — Other Ambulatory Visit: Payer: Self-pay | Admitting: Family Medicine

## 2017-01-18 DIAGNOSIS — Z1231 Encounter for screening mammogram for malignant neoplasm of breast: Secondary | ICD-10-CM

## 2017-01-25 ENCOUNTER — Ambulatory Visit
Admission: RE | Admit: 2017-01-25 | Discharge: 2017-01-25 | Disposition: A | Discharge status: Home / Self Care | Payer: Commercial Managed Care - PPO | Source: Ambulatory Visit | Attending: Family Medicine | Admitting: Family Medicine

## 2017-01-25 ENCOUNTER — Ambulatory Visit: Payer: Commercial Managed Care - PPO

## 2017-01-25 DIAGNOSIS — Z1231 Encounter for screening mammogram for malignant neoplasm of breast: Secondary | ICD-10-CM

## 2017-06-22 ENCOUNTER — Encounter: Payer: Self-pay | Admitting: Family Medicine

## 2017-06-22 ENCOUNTER — Ambulatory Visit (INDEPENDENT_AMBULATORY_CARE_PROVIDER_SITE_OTHER): Payer: Commercial Managed Care - PPO | Admitting: Family Medicine

## 2017-06-22 VITALS — BP 122/80 | HR 62 | Temp 97.8°F | Resp 15 | Wt 171.0 lb

## 2017-06-22 DIAGNOSIS — R42 Dizziness and giddiness: Secondary | ICD-10-CM

## 2017-06-22 LAB — GLUCOSE, POCT (MANUAL RESULT ENTRY): POC Glucose: 143 mg/dl — AB (ref 70–99)

## 2017-06-22 MED ORDER — MECLIZINE HCL 25 MG PO TABS: 25.0000 mg | ORAL_TABLET | Freq: Three times a day (TID) | ORAL | 0 refills | Status: DC | PRN

## 2017-06-22 NOTE — Progress Notes (Signed)
Subjective:     Patient ID: Krystal Woodward, female   DOB: Nov 12, 1955, 62 y.o.   MRN: [id number] Chief Complaint  Patient presents with  . Dizziness    Patient comes in office today with concerns of dizziness that began this morning. Patient states that when she stands or bends down she feels like the room is spinning. Patient reports symptoms of nause and hot/cold chills. Patient denies any ear pain or pressure, URI symptoms , headache, or sensitivity to light.    HPI States the first episode today happened while she was sitting on the toilet urinating lasting a few seconds. The second happened when she went to the gym later and was extending her neck to lift a weight. This also lasted a few seconds. Did feel transient nausea but no palpitations or syncope. She has eaten just prior to her visit today.  Review of Systems     Objective:   Physical Exam  Constitutional: She appears well-developed and well-nourished. No distress.  HENT:  Ear canals without obstruction; TM's intact-no inflammation  Eyes: EOM are normal.  Pupils small and equal   Neck: Carotid bruit is not present.  Cardiovascular: Normal rate and regular rhythm.  Pulmonary/Chest: Breath sounds normal.       Assessment:    1. Vertigo: most likely positional. Add meclizine 25 mg.tid prn #21 - POCT glucose (random)    Plan:    Further f/u prn not improving or new sx.

## 2017-06-22 NOTE — Patient Instructions (Signed)
Let us know if new symptoms or not improving. If this is positional vertigo it may take a few days to resolve.

## 2017-06-25 ENCOUNTER — Telehealth: Payer: Self-pay | Admitting: Family Medicine

## 2017-06-25 NOTE — Telephone Encounter (Signed)
Pt stated that she saw Krystal Woodward on 06/22/17 and started taking meclizine (ANTIVERT) 25 MG tablet. Pt stated that she thinks the medication is causing her more dizziness than she was before she started taking the medication and is requesting call back. Please advise. Thanks TNP

## 2017-06-25 NOTE — Telephone Encounter (Signed)
Patient states when she took meclizine 06/22/2017 she felt dizzy and off right afterward. Patient states that she took half again 06/23/2017 and had the same reaction. Patient wants to know if there is a different medication that she can try. Patient states she still has vertigo symptoms, lightheadedness but it has eased off some. Please advise?

## 2017-06-26 NOTE — Telephone Encounter (Signed)
Please review messages below and advise. KW

## 2017-06-26 NOTE — Telephone Encounter (Signed)
Pt calling back wanting to know why she was not called back yesterday regarding her message.  Pt requesting I send message to [name] since he was the one that put her on the medication and saw her for the vertigo.

## 2017-06-26 NOTE — Telephone Encounter (Signed)
Discussed-feeling a bit better today. Will call if vertigo not resolving over the next two days. If not will refer to ENT.

## 2018-01-07 ENCOUNTER — Other Ambulatory Visit: Payer: Self-pay | Admitting: Family Medicine

## 2018-01-07 DIAGNOSIS — Z1231 Encounter for screening mammogram for malignant neoplasm of breast: Secondary | ICD-10-CM

## 2018-01-18 ENCOUNTER — Ambulatory Visit (INDEPENDENT_AMBULATORY_CARE_PROVIDER_SITE_OTHER): Payer: Commercial Managed Care - PPO | Admitting: Family Medicine

## 2018-01-18 ENCOUNTER — Other Ambulatory Visit (HOSPITAL_COMMUNITY)
Admission: RE | Admit: 2018-01-18 | Discharge: 2018-01-18 | Disposition: A | Discharge status: Home / Self Care | Payer: Commercial Managed Care - PPO | Source: Ambulatory Visit | Attending: Family Medicine | Admitting: Family Medicine

## 2018-01-18 ENCOUNTER — Encounter: Payer: Self-pay | Admitting: Family Medicine

## 2018-01-18 VITALS — BP 93/59 | HR 64 | Temp 98.8°F | Resp 16 | Ht 65.0 in | Wt 162.0 lb

## 2018-01-18 DIAGNOSIS — Z01419 Encounter for gynecological examination (general) (routine) without abnormal findings: Secondary | ICD-10-CM

## 2018-01-18 DIAGNOSIS — R200 Anesthesia of skin: Secondary | ICD-10-CM

## 2018-01-18 DIAGNOSIS — H269 Unspecified cataract: Secondary | ICD-10-CM

## 2018-01-18 DIAGNOSIS — Z124 Encounter for screening for malignant neoplasm of cervix: Secondary | ICD-10-CM | POA: Insufficient documentation

## 2018-01-18 DIAGNOSIS — R7303 Prediabetes: Secondary | ICD-10-CM

## 2018-01-18 DIAGNOSIS — R5383 Other fatigue: Secondary | ICD-10-CM | POA: Diagnosis not present

## 2018-01-18 DIAGNOSIS — Z0000 Encounter for general adult medical examination without abnormal findings: Secondary | ICD-10-CM | POA: Diagnosis not present

## 2018-01-18 DIAGNOSIS — Z800 Family history of malignant neoplasm of digestive organs: Secondary | ICD-10-CM

## 2018-01-18 DIAGNOSIS — Z23 Encounter for immunization: Secondary | ICD-10-CM | POA: Diagnosis not present

## 2018-01-18 NOTE — Progress Notes (Signed)
Patient: Krystal Woodward, Female    DOB: 05/02/1955, 62 y.o.   MRN: [id number] Visit Date: 01/18/2018  Today's Provider: [name], MD   Chief Complaint  Patient presents with  . Annual Exam   Subjective:    Annual physical exam Krystal Woodward is a 62 y.o. female who presents today for health maintenance and complete physical. She feels fairly well. She reports exercising 5 times a week. She reports she is sleeping fairly well. She feels well today. She reports extensive family history of GI cancer. She had colonoscopy in 2016 by Dr. Bluford Kaufmann with no polyps, but reports history of multiple family members having colon cancer and states she is supposed to have colonoscopies every 3 years.   She states he does have occasionally  Numbness in fingers, no weakness. No stinging or burning. Has occasionally cramping in legs and cramping in her right arm from time to time.   She also has family history of cervical cancer and is insistent on having pap done every year.  -----------------------------------------------------------------  Pre-diabetes From 01/16/2017-no changes. Hgb A1c 5.7. Today patient is reporting good compliance with healthy diet and regular exercise.   Review of Systems  Constitutional: Negative for chills, fatigue and fever.  HENT: Positive for ear pain. Negative for congestion, rhinorrhea, sneezing and sore throat.   Eyes: Negative.  Negative for pain and redness.  Respiratory: Negative for cough, shortness of breath and wheezing.   Cardiovascular: Negative for chest pain and leg swelling.  Gastrointestinal: Negative for abdominal pain, blood in stool, constipation, diarrhea and nausea.  Endocrine: Negative for polydipsia and polyphagia.  Genitourinary: Negative.  Negative for dysuria, flank pain, hematuria, pelvic pain, vaginal bleeding and vaginal discharge.  Musculoskeletal: Negative for arthralgias, back pain, gait problem and joint swelling.  Skin: Negative for  rash.  Neurological: Negative.  Negative for dizziness, tremors, seizures, weakness, light-headedness, numbness and headaches.  Hematological: Negative for adenopathy.  Psychiatric/Behavioral: Negative.  Negative for behavioral problems, confusion and dysphoric mood. The patient is not nervous/anxious and is not hyperactive.     Social History      She  reports that she has never smoked. She has never used smokeless tobacco. She reports that she does not drink alcohol or use drugs.       Social History   Socioeconomic History  . Marital status: Married    Spouse name: Not on file  . Number of children: 4  . Years of education: Not on file  . Highest education level: Not on file  Occupational History  . Occupation: CNA     Comment: [employer]  . Financial resource strain: Not on file  . Food insecurity:    Worry: Not on file    Inability: Not on file  . Transportation needs:    Medical: Not on file    Non-medical: Not on file  Tobacco Use  . Smoking status: Never Smoker  . Smokeless tobacco: Never Used  Substance and Sexual Activity  . Alcohol use: No  . Drug use: No  . Sexual activity: Not on file  Lifestyle  . Physical activity:    Days per week: Not on file    Minutes per session: Not on file  . Stress: Not on file  Relationships  . Social connections:    Talks on phone: Not on file    Gets together: Not on file    Attends religious service: Not on file  Active member of club or organization: Not on file    Attends meetings of clubs or organizations: Not on file    Relationship status: Not on file  Other Topics Concern  . Not on file  Social History Narrative  . Not on file    Past Medical History:  Diagnosis Date  . Bradycardia    mild  . Breast mass 01/24/2011  . Chest pain   . GERD (gastroesophageal reflux disease)   . Hyperprolactinemia (HCC) [id number]   Normal MRI  . Vitamin D deficiency      Patient Active Problem List    Diagnosis Date Noted  . Post-menopausal 01/14/2016  . Pre-diabetes 01/06/2015  . Bilateral knee pain 01/04/2015  . Overweight 01/04/2015  . Hyperprolactinemia (HCC) 01/04/2015  . Fatigue 01/04/2015  . Family history of diabetes mellitus 01/04/2015  . Family history of colon cancer 01/04/2015  . MURMUR 06/30/2010    Past Surgical History:  Procedure Laterality Date  . BREAST BIOPSY Right 06/17/15   Inflammed Fibrocyst  . BREAST CYST EXCISION  02/27/2011   Procedure: CYST EXCISION BREAST;  Surgeon: Currie Paris, MD;  Location: [facility];  Service: General;  Laterality: Left;  Needle localization removal left breast mass  . BREAST EXCISIONAL BIOPSY Left   . BUNIONECTOMY     both feet  . INCISION AND DRAINAGE ABSCESS Right 07/28/2015   Procedure: INCISION AND DRAINAGE RIGHT BREAST  ABSCESS;  Surgeon: Manus Rudd, MD;  Location: [facility];  Service: General;  Laterality: Right;  . TUBAL LIGATION  1996    Family History        Family Status  Relation Name Status  . Mother  Deceased at age 28  . Father  Deceased at age 51's  . Sister  Deceased  . PGM  (Not Specified)        Her family history includes Asthma in her paternal grandmother; Cancer in her mother; Cancer (age of onset: 45) in her sister; Diabetes in her father and paternal grandmother; Transient ischemic attack in her mother.      No Known Allergies   Current Outpatient Medications:  .  aspirin 81 MG tablet, Take 81 mg by mouth daily., Disp: , Rfl:  .  Cholecalciferol (D-3-5) 5000 UNITS capsule, Take 10,000 Units by mouth daily. , Disp: , Rfl:  .  meclizine (ANTIVERT) 25 MG tablet, Take 1 tablet (25 mg total) by mouth 3 (three) times daily as needed for dizziness., Disp: 21 tablet, Rfl: 0 .  Multiple Vitamins-Minerals (CENTRUM SILVER ULTRA WOMENS PO), Take 1 capsule by mouth daily.  , Disp: , Rfl:  .  Omega-3 Fatty Acids (FISH OIL PO), Take 1,500 Units by mouth.  , Disp: , Rfl:  .   TURMERIC CURCUMIN PO, Take 1 tablet by mouth daily., Disp: , Rfl:    Patient Care Team: Malva Limes, MD as PCP - General (Family Medicine)      Objective:   Vitals: BP (!) 93/59 (BP Location: Left Arm, Patient Position: Sitting, Cuff Size: Large)   Pulse 64   Temp 98.8 F (37.1 C) (Oral)   Resp 16   Ht 5\' 5"  (1.651 m)   Wt 162 lb (73.5 kg)   SpO2 99% Comment: room  air  BMI 26.96 kg/m    Vitals:   01/18/18 1421  BP: (!) 93/59  Pulse: 64  Resp: 16  Temp: 98.8 F (37.1 C)  TempSrc: Oral  SpO2: 99%  Weight:  162 lb (73.5 kg)  Height: 5\' 5"  (1.651 m)     Physical Exam   General Appearance:    Alert, cooperative, no distress, appears stated age  Head:    Normocephalic, without obvious abnormality, atraumatic  Eyes:    PERRL, conjunctiva/corneas clear, EOM's intact, fundi    benign, both eyes  Ears:    Normal TM's and external ear canals, both ears  Nose:   Nares normal, septum midline, mucosa normal, no drainage    or sinus tenderness  Throat:   Lips, mucosa, and tongue normal; teeth and gums normal  Neck:   Supple, symmetrical, trachea midline, no adenopathy;    thyroid:  no enlargement/tenderness/nodules; no carotid   bruit or JVD  Back:     Symmetric, no curvature, ROM normal, no CVA tenderness  Lungs:     Clear to auscultation bilaterally, respirations unlabored  Chest Wall:    No tenderness or deformity   Heart:    Regular rate and rhythm, S1 and S2 normal, no murmur, rub   or gallop  Breast Exam:    normal appearance, no masses or tenderness  Abdomen:     Soft, non-tender, bowel sounds active all four quadrants,    no masses, no organomegaly  Pelvic:    cervix normal in appearance, external genitalia normal, no bladder tenderness and no cervical motion tenderness  Extremities:   Extremities normal, atraumatic, no cyanosis or edema  Pulses:   2+ and symmetric all extremities  Skin:   Skin color, texture, turgor normal, no rashes or lesions  Lymph nodes:    Cervical, supraclavicular, and axillary nodes normal  Neurologic:   CNII-XII intact, normal strength, sensation and reflexes    throughout   Depression Screen PHQ 2/9 Scores 01/18/2018 01/16/2017 01/10/2016 01/04/2015  PHQ - 2 Score 0 0 0 0  PHQ- 9 Score 0 - 0 0      Assessment & Plan:     Routine Health Maintenance and Physical Exam  Exercise Activities and Dietary recommendations Goals   None     Immunization History  Administered Date(s) Administered  . Influenza,inj,Quad PF,6+ Mos 01/16/2017  . Tdap 01/28/2010    Health Maintenance  Topic Date Due  . HIV Screening  06/04/1972  . INFLUENZA VACCINE  11/22/2017  . MAMMOGRAM  01/26/2019  . PAP SMEAR  01/17/2020  . TETANUS/TDAP  01/29/2020  . COLONOSCOPY  06/01/2024  . Hepatitis C Screening  Completed     Discussed health benefits of physical activity, and encouraged her to engage in regular exercise appropriate for her age and condition.    --------------------------------------------------------------------  1. Annual physical exam  - CBC - Comprehensive metabolic panel - Lipid panel - Hemoglobin A1c - T4, free - TSH - Vitamin B12 - VITAMIN D 25 Hydroxy (Vit-D Deficiency, Fractures)  2. Well woman exam with routine gynecological exam Normal exam   3. Need for influenza vaccination  - Flu Vaccine QUAD 6+ mos PF IM (Fluarix Quad PF)  4. Other fatigue  - CBC - Comprehensive metabolic panel - Lipid panel - T4, free - TSH  5. Pre-diabetes  - Hemoglobin A1c  6. Numbness  - Vitamin B12 - VITAMIN D 25 Hydroxy (Vit-D Deficiency, Fractures)  7. Family history of colon cancer  - Ambulatory referral to Gastroenterology  8. Screening for cervical cancer  - Cytology - PAP   [name], MD  [facility]

## 2018-01-19 LAB — COMPREHENSIVE METABOLIC PANEL
A/G RATIO: 1.7 (ref 1.2–2.2)
ALT: 12 IU/L (ref 0–32)
AST: 17 IU/L (ref 0–40)
Albumin: 4.3 g/dL (ref 3.6–4.8)
Alkaline Phosphatase: 118 IU/L — ABNORMAL HIGH (ref 39–117)
BUN/Creatinine Ratio: 14 (ref 12–28)
BUN: 13 mg/dL (ref 8–27)
Bilirubin Total: 0.4 mg/dL (ref 0.0–1.2)
CALCIUM: 9.4 mg/dL (ref 8.7–10.3)
CO2: 20 mmol/L (ref 20–29)
Chloride: 105 mmol/L (ref 96–106)
Creatinine, Ser: 0.94 mg/dL (ref 0.57–1.00)
GFR, EST AFRICAN AMERICAN: 76 mL/min/{1.73_m2} (ref >59)
GFR, EST NON AFRICAN AMERICAN: 66 mL/min/{1.73_m2} (ref >59)
GLUCOSE: 80 mg/dL (ref 65–99)
Globulin, Total: 2.6 g/dL (ref 1.5–4.5)
Potassium: 3.9 mmol/L (ref 3.5–5.2)
Sodium: 141 mmol/L (ref 134–144)
TOTAL PROTEIN: 6.9 g/dL (ref 6.0–8.5)

## 2018-01-19 LAB — CBC
HEMOGLOBIN: 12.3 g/dL (ref 11.1–15.9)
Hematocrit: 36.9 % (ref 34.0–46.6)
MCH: 31 pg (ref 26.6–33.0)
MCHC: 33.3 g/dL (ref 31.5–35.7)
MCV: 93 fL (ref 79–97)
PLATELETS: 247 10*3/uL (ref 150–450)
RBC: 3.97 x10E6/uL (ref 3.77–5.28)
RDW: 12.4 % (ref 12.3–15.4)
WBC: 7.9 10*3/uL (ref 3.4–10.8)

## 2018-01-19 LAB — LIPID PANEL
Chol/HDL Ratio: 3.1 ratio (ref 0.0–4.4)
Cholesterol, Total: 150 mg/dL (ref 100–199)
HDL: 48 mg/dL (ref >39)
LDL Calculated: 90 mg/dL (ref 0–99)
Triglycerides: 60 mg/dL (ref 0–149)
VLDL Cholesterol Cal: 12 mg/dL (ref 5–40)

## 2018-01-19 LAB — TSH: TSH: 1.74 u[IU]/mL (ref 0.450–4.500)

## 2018-01-19 LAB — T4, FREE: FREE T4: 1.1 ng/dL (ref 0.82–1.77)

## 2018-01-19 LAB — VITAMIN B12: VITAMIN B 12: 1132 pg/mL (ref 232–1245)

## 2018-01-19 LAB — HEMOGLOBIN A1C
ESTIMATED AVERAGE GLUCOSE: 120 mg/dL
HEMOGLOBIN A1C: 5.8 % — AB (ref 4.8–5.6)

## 2018-01-19 LAB — VITAMIN D 25 HYDROXY (VIT D DEFICIENCY, FRACTURES): Vit D, 25-Hydroxy: 41.3 ng/mL (ref 30.0–100.0)

## 2018-01-20 DIAGNOSIS — H269 Unspecified cataract: Secondary | ICD-10-CM | POA: Insufficient documentation

## 2018-01-21 LAB — CYTOLOGY - PAP: DIAGNOSIS: NEGATIVE

## 2018-02-07 ENCOUNTER — Ambulatory Visit
Admission: RE | Admit: 2018-02-07 | Discharge: 2018-02-07 | Disposition: A | Discharge status: Home / Self Care | Payer: Commercial Managed Care - PPO | Source: Ambulatory Visit | Attending: Family Medicine | Admitting: Family Medicine

## 2018-02-07 DIAGNOSIS — Z1231 Encounter for screening mammogram for malignant neoplasm of breast: Secondary | ICD-10-CM

## 2018-06-05 ENCOUNTER — Ambulatory Visit: Payer: Commercial Managed Care - PPO | Admitting: Family Medicine

## 2018-06-06 ENCOUNTER — Ambulatory Visit
Admission: RE | Admit: 2018-06-06 | Discharge: 2018-06-06 | Disposition: A | Discharge status: Home / Self Care | Payer: Commercial Managed Care - PPO | Source: Ambulatory Visit | Attending: Family Medicine | Admitting: Family Medicine

## 2018-06-06 ENCOUNTER — Encounter: Payer: Self-pay | Admitting: Family Medicine

## 2018-06-06 ENCOUNTER — Ambulatory Visit (INDEPENDENT_AMBULATORY_CARE_PROVIDER_SITE_OTHER): Payer: Commercial Managed Care - PPO | Admitting: Family Medicine

## 2018-06-06 ENCOUNTER — Telehealth: Payer: Self-pay

## 2018-06-06 ENCOUNTER — Ambulatory Visit
Admission: RE | Admit: 2018-06-06 | Discharge: 2018-06-06 | Disposition: A | Discharge status: Home / Self Care | Payer: Commercial Managed Care - PPO | Attending: Family Medicine | Admitting: Family Medicine

## 2018-06-06 VITALS — BP 110/82 | HR 48 | Temp 98.5°F | Resp 16 | Wt 167.4 lb

## 2018-06-06 DIAGNOSIS — M25511 Pain in right shoulder: Secondary | ICD-10-CM | POA: Insufficient documentation

## 2018-06-06 NOTE — Progress Notes (Signed)
  Subjective:     Patient ID: Krystal Woodward, female   DOB: [birth year], 63 y.o.   MRN: [id number] Chief Complaint  Patient presents with  . Arm Pain    Pt reports to the office today for right arm pain x2 weeks. Pt has tried Advil and Tylenol with minimal relief. Pt also reports that the pain has been radiating to her neck at times.   HPI Localizes the pain around her shoulder and has a analgesic patch on today.No specific injury reported but she works as a  LawyerCNA and was working out regularly with upper body free weights prior to the onset of her sx.  Review of Systems     Objective:   Physical Exam Constitutional:      General: She is not in acute distress. Musculoskeletal:     Comments: Right U.E. muscle strength 5/5 but has increased pain with internal rotation of her shoulder. Tends to localize the tenderness in her upper deltoid/acromion tip area. I can passively range her shoulder > 90 degrees but has increased pain with active flexion.  Neurological:     Mental Status: She is alert.        Assessment:    1. Acute pain of right shoulder - DG Shoulder Right; Future - Ambulatory referral to Orthopedic Surgery    Plan:    Discussed continued use of heat, analgesic patches, nsaid's and Tylenol. Have written an excuse fro lifting limitations of 10# for 2/15-2/19/20.

## 2018-06-06 NOTE — Patient Instructions (Signed)
Continue taking 3 Advil 3 x day with meals. Continue heat for 20 minutes several x day and SalonPas patches. May use Tylenol up to 3000 mg/day. We will call you with the x-ray result. You will also receive a call from the orthopedic office.

## 2018-06-06 NOTE — Telephone Encounter (Signed)
Patient has been advised she states that she would like to be referred back to Dr. Ernest Pine. KW

## 2018-06-06 NOTE — Telephone Encounter (Signed)
-----   Message from Anola Gurney, [place] sent at 06/06/2018  2:43 PM EST ----- No bony abnormality/ Proceed with orthopedic evaluation

## 2018-07-02 ENCOUNTER — Other Ambulatory Visit: Payer: Self-pay | Admitting: Surgery

## 2018-07-02 DIAGNOSIS — S46011A Strain of muscle(s) and tendon(s) of the rotator cuff of right shoulder, initial encounter: Secondary | ICD-10-CM

## 2018-07-08 ENCOUNTER — Other Ambulatory Visit: Payer: Self-pay

## 2018-07-08 ENCOUNTER — Ambulatory Visit
Admission: RE | Admit: 2018-07-08 | Discharge: 2018-07-08 | Disposition: A | Discharge status: Home / Self Care | Payer: Commercial Managed Care - PPO | Source: Ambulatory Visit | Attending: Surgery | Admitting: Surgery

## 2018-07-08 DIAGNOSIS — S46011A Strain of muscle(s) and tendon(s) of the rotator cuff of right shoulder, initial encounter: Secondary | ICD-10-CM | POA: Insufficient documentation

## 2018-10-07 ENCOUNTER — Other Ambulatory Visit: Payer: Self-pay

## 2018-10-07 ENCOUNTER — Encounter
Admission: RE | Admit: 2018-10-07 | Discharge: 2018-10-07 | Disposition: A | Discharge status: Home / Self Care | Payer: Commercial Managed Care - PPO | Source: Ambulatory Visit | Attending: Surgery | Admitting: Surgery

## 2018-10-07 DIAGNOSIS — S46111A Strain of muscle, fascia and tendon of long head of biceps, right arm, initial encounter: Secondary | ICD-10-CM | POA: Diagnosis not present

## 2018-10-07 DIAGNOSIS — K219 Gastro-esophageal reflux disease without esophagitis: Secondary | ICD-10-CM | POA: Diagnosis not present

## 2018-10-07 DIAGNOSIS — M75101 Unspecified rotator cuff tear or rupture of right shoulder, not specified as traumatic: Secondary | ICD-10-CM | POA: Diagnosis present

## 2018-10-07 DIAGNOSIS — Z01812 Encounter for preprocedural laboratory examination: Secondary | ICD-10-CM | POA: Insufficient documentation

## 2018-10-07 DIAGNOSIS — M25811 Other specified joint disorders, right shoulder: Secondary | ICD-10-CM | POA: Diagnosis not present

## 2018-10-07 DIAGNOSIS — M75111 Incomplete rotator cuff tear or rupture of right shoulder, not specified as traumatic: Secondary | ICD-10-CM | POA: Diagnosis not present

## 2018-10-07 DIAGNOSIS — Z1159 Encounter for screening for other viral diseases: Secondary | ICD-10-CM | POA: Diagnosis not present

## 2018-10-07 DIAGNOSIS — X58XXXA Exposure to other specified factors, initial encounter: Secondary | ICD-10-CM | POA: Diagnosis not present

## 2018-10-07 DIAGNOSIS — M7521 Bicipital tendinitis, right shoulder: Secondary | ICD-10-CM | POA: Diagnosis not present

## 2018-10-07 DIAGNOSIS — M659 Synovitis and tenosynovitis, unspecified: Secondary | ICD-10-CM | POA: Diagnosis not present

## 2018-10-07 HISTORY — DX: Abnormal levels of other serum enzymes: R74.8

## 2018-10-07 LAB — COMPREHENSIVE METABOLIC PANEL
ALT: 16 U/L (ref 0–44)
AST: 20 U/L (ref 15–41)
Albumin: 4.2 g/dL (ref 3.5–5.0)
Alkaline Phosphatase: 147 U/L — ABNORMAL HIGH (ref 38–126)
Anion gap: 9 (ref 5–15)
BUN: 14 mg/dL (ref 8–23)
CO2: 27 mmol/L (ref 22–32)
Calcium: 9.8 mg/dL (ref 8.9–10.3)
Chloride: 105 mmol/L (ref 98–111)
Creatinine, Ser: 0.78 mg/dL (ref 0.44–1.00)
GFR calc Af Amer: >60 mL/min (ref >60)
GFR calc non Af Amer: >60 mL/min (ref >60)
Glucose, Bld: 101 mg/dL — ABNORMAL HIGH (ref 70–99)
Potassium: 3.9 mmol/L (ref 3.5–5.1)
Sodium: 141 mmol/L (ref 135–145)
Total Bilirubin: 0.3 mg/dL (ref 0.3–1.2)
Total Protein: 7.9 g/dL (ref 6.5–8.1)

## 2018-10-07 NOTE — Patient Instructions (Signed)
Your procedure is scheduled on: 10-10-18 THURSDAY Report to Same Day Surgery 2nd floor medical mall [facility] Entrance-take elevator on left to 2nd floor.  Check in with surgery information desk.) To find out your arrival time please call [phone] between 1PM - 3PM on 10-09-18 [facility]  Remember: Instructions that are not followed completely may result in serious medical risk, up to and including death, or upon the discretion of your surgeon and anesthesiologist your surgery may need to be rescheduled.    _x___ 1. Do not eat food after midnight the night before your procedure. NO GUM OR CANDY AFTER MIDNIGHT.  You may drink clear liquids up to 2 hours before you are scheduled to arrive at the hospital for your procedure.  Do not drink clear liquids within 2 hours of your scheduled arrival to the hospital.  Clear liquids include  --Water or Apple juice without pulp  --Clear carbohydrate beverage such as ClearFast or Gatorade  --Black Coffee or Clear Tea (No milk, no creamers, do not add anything to the coffee or Tea   ____Ensure clear carbohydrate drink on the way to the hospital for bariatric patients  ____Ensure clear carbohydrate drink 3 hours before surgery for Dr Dwyane Luo patients if physician instructed.   No gum chewing or hard candies.     __x__ 2. No Alcohol for 24 hours before or after surgery.   __x__3. No Smoking or e-cigarettes for 24 prior to surgery.  Do not use any chewable tobacco products for at least 6 hour prior to surgery   ____  4. Bring all medications with you on the day of surgery if instructed.    __x__ 5. Notify your doctor if there is any change in your medical condition     (cold, fever, infections).    x___6. On the morning of surgery brush your teeth with toothpaste and water.  You may rinse your mouth with mouth wash if you wish.  Do not swallow any toothpaste or mouthwash.   Do not wear jewelry, make-up, hairpins, clips or nail polish.  Do not  wear lotions, powders, or perfumes. You may wear deodorant.  Do not shave 48 hours prior to surgery. Men may shave face and neck.  Do not bring valuables to the hospital.    [facility] is not responsible for any belongings or valuables.               Contacts, dentures or bridgework may not be worn into surgery.  Leave your suitcase in the car. After surgery it may be brought to your room.  For patients admitted to the hospital, discharge time is determined by your treatment team.  _  Patients discharged the day of surgery will not be allowed to drive home.  You will need someone to drive you home and stay with you the night of your procedure.    Please read over the following fact sheets that you were given:   [facility] Preparing for Surgery  ____ Take anti-hypertensive listed below, cardiac, seizure, asthma, anti-reflux and psychiatric medicines. These include:  1. NONE  2.  3.  4.  5.  6.  ____Fleets enema or Magnesium Citrate as directed.   _x___ Use CHG Soap or sage wipes as directed on instruction sheet   ____ Use inhalers on the day of surgery and bring to hospital day of surgery  ____ Stop Metformin and Janumet 2 days prior to surgery.    ____ Take 1/2 of usual insulin  dose the night before surgery and none on the morning surgery.   _X___ Follow recommendations from Cardiologist, Pulmonologist or PCP regarding stopping Aspirin, Coumadin, Plavix ,Eliquis, Effient, or Pradaxa, and Pletal-STOP ASPIRIN NOW  X____Stop Anti-inflammatories such as Advil, Aleve, Ibuprofen, Motrin, Naproxen, Naprosyn, Goodies powders or aspirin products NOW-OK to take Tylenol    _x___ Stop supplements until after surgery-STOP TURMERIC AND FISH OIL NOW-MAY RESUME AFTER SURGERY   ____ Bring C-Pap to the hospital.

## 2018-10-08 LAB — NOVEL CORONAVIRUS, NAA (HOSP ORDER, SEND-OUT TO REF LAB; TAT 18-24 HRS): SARS-CoV-2, NAA: NOT DETECTED

## 2018-10-10 ENCOUNTER — Ambulatory Visit: Payer: Commercial Managed Care - PPO | Admitting: Anesthesiology

## 2018-10-10 ENCOUNTER — Other Ambulatory Visit: Payer: Self-pay

## 2018-10-10 ENCOUNTER — Encounter
Admission: RE | Disposition: A | Discharge status: Home / Self Care | Payer: Self-pay | Source: Ambulatory Visit | Attending: Surgery

## 2018-10-10 ENCOUNTER — Ambulatory Visit
Admission: RE | Admit: 2018-10-10 | Discharge: 2018-10-10 | Disposition: A | Discharge status: Home / Self Care | Payer: Commercial Managed Care - PPO | Source: Ambulatory Visit | Attending: Surgery | Admitting: Surgery

## 2018-10-10 DIAGNOSIS — M75111 Incomplete rotator cuff tear or rupture of right shoulder, not specified as traumatic: Secondary | ICD-10-CM | POA: Insufficient documentation

## 2018-10-10 DIAGNOSIS — Z1159 Encounter for screening for other viral diseases: Secondary | ICD-10-CM | POA: Insufficient documentation

## 2018-10-10 DIAGNOSIS — K219 Gastro-esophageal reflux disease without esophagitis: Secondary | ICD-10-CM | POA: Insufficient documentation

## 2018-10-10 DIAGNOSIS — M659 Synovitis and tenosynovitis, unspecified: Secondary | ICD-10-CM | POA: Insufficient documentation

## 2018-10-10 DIAGNOSIS — X58XXXA Exposure to other specified factors, initial encounter: Secondary | ICD-10-CM | POA: Insufficient documentation

## 2018-10-10 DIAGNOSIS — S46111A Strain of muscle, fascia and tendon of long head of biceps, right arm, initial encounter: Secondary | ICD-10-CM | POA: Insufficient documentation

## 2018-10-10 DIAGNOSIS — M25811 Other specified joint disorders, right shoulder: Secondary | ICD-10-CM | POA: Insufficient documentation

## 2018-10-10 DIAGNOSIS — M7521 Bicipital tendinitis, right shoulder: Secondary | ICD-10-CM | POA: Insufficient documentation

## 2018-10-10 HISTORY — PX: SHOULDER ARTHROSCOPY WITH SUBACROMIAL DECOMPRESSION: SHX5684

## 2018-10-10 HISTORY — PX: SHOULDER ARTHROSCOPY WITH ROTATOR CUFF REPAIR AND OPEN BICEPS TENODESIS: SHX6677

## 2018-10-10 SURGERY — SHOULDER ARTHROSCOPY WITH SUBACROMIAL DECOMPRESSION
Anesthesia: General

## 2018-10-10 MED ORDER — SODIUM CHLORIDE 0.9 % IV SOLN
INTRAVENOUS | Status: DC | PRN
  Administered 2018-10-10: 30 ug/min via INTRAVENOUS

## 2018-10-10 MED ORDER — OXYCODONE HCL 5 MG PO TABS: 5.0000 mg | ORAL_TABLET | ORAL | 0 refills | Status: DC | PRN

## 2018-10-10 MED ORDER — PROPOFOL 10 MG/ML IV BOLUS
INTRAVENOUS | Status: AC
  Filled 2018-10-10: qty 40

## 2018-10-10 MED ORDER — LIDOCAINE HCL (CARDIAC) PF 100 MG/5ML IV SOSY
PREFILLED_SYRINGE | INTRAVENOUS | Status: DC | PRN
  Administered 2018-10-10: 60 mg via INTRAVENOUS

## 2018-10-10 MED ORDER — POTASSIUM CHLORIDE IN NACL 20-0.9 MEQ/L-% IV SOLN: INTRAVENOUS | Status: DC

## 2018-10-10 MED ORDER — MIDAZOLAM HCL 2 MG/2ML IJ SOLN
INTRAMUSCULAR | Status: DC | PRN
  Administered 2018-10-10 (×2): 1 mg via INTRAVENOUS

## 2018-10-10 MED ORDER — DEXAMETHASONE SODIUM PHOSPHATE 10 MG/ML IJ SOLN
INTRAMUSCULAR | Status: DC | PRN
  Administered 2018-10-10: 4 mg via INTRAVENOUS

## 2018-10-10 MED ORDER — METOCLOPRAMIDE HCL 10 MG PO TABS: 5.0000 mg | ORAL_TABLET | Freq: Three times a day (TID) | ORAL | Status: DC | PRN

## 2018-10-10 MED ORDER — LACTATED RINGERS IV SOLN
INTRAVENOUS | Status: DC
  Administered 2018-10-10: 1000 mL via INTRAVENOUS

## 2018-10-10 MED ORDER — EPINEPHRINE PF 1 MG/ML IJ SOLN
INTRAMUSCULAR | Status: AC
  Filled 2018-10-10: qty 2

## 2018-10-10 MED ORDER — MIDAZOLAM HCL 2 MG/2ML IJ SOLN
1.0000 mg | Freq: Once | INTRAMUSCULAR | Status: AC
  Administered 2018-10-10: 1 mg via INTRAVENOUS

## 2018-10-10 MED ORDER — BUPIVACAINE HCL (PF) 0.5 % IJ SOLN
INTRAMUSCULAR | Status: DC | PRN
  Administered 2018-10-10: 3 mL via PERINEURAL
  Administered 2018-10-10: 7 mL via PERINEURAL

## 2018-10-10 MED ORDER — PROPOFOL 10 MG/ML IV BOLUS
INTRAVENOUS | Status: DC | PRN
  Administered 2018-10-10: 150 mg via INTRAVENOUS

## 2018-10-10 MED ORDER — FENTANYL CITRATE (PF) 100 MCG/2ML IJ SOLN
INTRAMUSCULAR | Status: AC
  Filled 2018-10-10: qty 2

## 2018-10-10 MED ORDER — BUPIVACAINE LIPOSOME 1.3 % IJ SUSP
INTRAMUSCULAR | Status: DC | PRN
  Administered 2018-10-10: 13 mL via PERINEURAL
  Administered 2018-10-10: 7 mL via PERINEURAL

## 2018-10-10 MED ORDER — CEFAZOLIN SODIUM-DEXTROSE 2-4 GM/100ML-% IV SOLN
INTRAVENOUS | Status: AC
  Filled 2018-10-10: qty 100

## 2018-10-10 MED ORDER — FAMOTIDINE 20 MG PO TABS
20.0000 mg | ORAL_TABLET | Freq: Once | ORAL | Status: AC
  Administered 2018-10-10: 20 mg via ORAL

## 2018-10-10 MED ORDER — OXYCODONE HCL 5 MG PO TABS: 5.0000 mg | ORAL_TABLET | ORAL | Status: DC | PRN

## 2018-10-10 MED ORDER — BUPIVACAINE HCL (PF) 0.5 % IJ SOLN
INTRAMUSCULAR | Status: AC
  Filled 2018-10-10: qty 10

## 2018-10-10 MED ORDER — FENTANYL CITRATE (PF) 100 MCG/2ML IJ SOLN
50.0000 ug | Freq: Once | INTRAMUSCULAR | Status: AC
  Administered 2018-10-10: 50 ug via INTRAVENOUS

## 2018-10-10 MED ORDER — ONDANSETRON HCL 4 MG PO TABS: 4.0000 mg | ORAL_TABLET | Freq: Four times a day (QID) | ORAL | Status: DC | PRN

## 2018-10-10 MED ORDER — BUPIVACAINE-EPINEPHRINE (PF) 0.5% -1:200000 IJ SOLN
INTRAMUSCULAR | Status: AC
  Filled 2018-10-10: qty 30

## 2018-10-10 MED ORDER — ONDANSETRON HCL 4 MG/2ML IJ SOLN: 4.0000 mg | Freq: Four times a day (QID) | INTRAMUSCULAR | Status: DC | PRN

## 2018-10-10 MED ORDER — MIDAZOLAM HCL 2 MG/2ML IJ SOLN
INTRAMUSCULAR | Status: AC
  Filled 2018-10-10: qty 2

## 2018-10-10 MED ORDER — ONDANSETRON HCL 4 MG/2ML IJ SOLN
INTRAMUSCULAR | Status: DC | PRN
  Administered 2018-10-10: 4 mg via INTRAVENOUS

## 2018-10-10 MED ORDER — GLYCOPYRROLATE 0.2 MG/ML IJ SOLN
INTRAMUSCULAR | Status: DC | PRN
  Administered 2018-10-10: 0.2 mg via INTRAVENOUS

## 2018-10-10 MED ORDER — FAMOTIDINE 20 MG PO TABS
ORAL_TABLET | ORAL | Status: AC
  Filled 2018-10-10: qty 1

## 2018-10-10 MED ORDER — SUGAMMADEX SODIUM 200 MG/2ML IV SOLN
INTRAVENOUS | Status: DC | PRN
  Administered 2018-10-10: 180 mg via INTRAVENOUS

## 2018-10-10 MED ORDER — CEFAZOLIN SODIUM-DEXTROSE 2-4 GM/100ML-% IV SOLN
2.0000 g | Freq: Once | INTRAVENOUS | Status: AC
  Administered 2018-10-10: 2 g via INTRAVENOUS

## 2018-10-10 MED ORDER — BUPIVACAINE-EPINEPHRINE 0.5% -1:200000 IJ SOLN
INTRAMUSCULAR | Status: DC | PRN
  Administered 2018-10-10: 30 mL

## 2018-10-10 MED ORDER — OXYCODONE HCL 5 MG PO TABS: 5.0000 mg | ORAL_TABLET | Freq: Once | ORAL | Status: DC | PRN

## 2018-10-10 MED ORDER — FENTANYL CITRATE (PF) 100 MCG/2ML IJ SOLN: 25.0000 ug | INTRAMUSCULAR | Status: DC | PRN

## 2018-10-10 MED ORDER — BUPIVACAINE LIPOSOME 1.3 % IJ SUSP
INTRAMUSCULAR | Status: AC
  Filled 2018-10-10: qty 20

## 2018-10-10 MED ORDER — METOCLOPRAMIDE HCL 5 MG/ML IJ SOLN: 5.0000 mg | Freq: Three times a day (TID) | INTRAMUSCULAR | Status: DC | PRN

## 2018-10-10 MED ORDER — ROCURONIUM BROMIDE 100 MG/10ML IV SOLN
INTRAVENOUS | Status: DC | PRN
  Administered 2018-10-10: 50 mg via INTRAVENOUS

## 2018-10-10 MED ORDER — LIDOCAINE HCL (PF) 1 % IJ SOLN
INTRAMUSCULAR | Status: AC
  Filled 2018-10-10: qty 5

## 2018-10-10 MED ORDER — OXYCODONE HCL 5 MG/5ML PO SOLN: 5.0000 mg | Freq: Once | ORAL | Status: DC | PRN

## 2018-10-10 MED ORDER — LIDOCAINE HCL (PF) 2 % IJ SOLN
INTRAMUSCULAR | Status: AC
  Filled 2018-10-10: qty 10

## 2018-10-10 MED ORDER — FENTANYL CITRATE (PF) 100 MCG/2ML IJ SOLN
INTRAMUSCULAR | Status: DC | PRN
  Administered 2018-10-10 (×2): 50 ug via INTRAVENOUS

## 2018-10-10 SURGICAL SUPPLY — 46 items
ANCH SUT 2 2.9 2 LD TPR NDL (Anchor) ×6 IMPLANT
ANCH SUT KNTLS STRL SHLDR SYS (Anchor) ×4 IMPLANT
ANCHOR JUGGERKNOT WTAP NDL 2.9 (Anchor) ×6 IMPLANT
ANCHOR SUT QUATTRO KNTLS 4.5 (Anchor) ×4 IMPLANT
APL PRP STRL LF DISP 70% ISPRP (MISCELLANEOUS) ×2
BIT DRILL JUGRKNT W/NDL BIT2.9 (DRILL) ×1 IMPLANT
BLADE FULL RADIUS 3.5 (BLADE) ×3 IMPLANT
BUR ACROMIONIZER 4.0 (BURR) ×3 IMPLANT
CANNULA SHAVER 8MMX76MM (CANNULA) ×3 IMPLANT
CHLORAPREP W/TINT 26 (MISCELLANEOUS) ×3 IMPLANT
COVER MAYO STAND STRL (DRAPES) ×3 IMPLANT
COVER WAND RF STERILE (DRAPES) ×3 IMPLANT
DRAPE IMP U-DRAPE 54X76 (DRAPES) ×6 IMPLANT
DRILL JUGGERKNOT W/NDL BIT 2.9 (DRILL) ×3
ELECT REM PT RETURN 9FT ADLT (ELECTROSURGICAL) ×3
ELECTRODE REM PT RTRN 9FT ADLT (ELECTROSURGICAL) ×2 IMPLANT
GAUZE SPONGE 4X4 12PLY STRL (GAUZE/BANDAGES/DRESSINGS) ×3 IMPLANT
GAUZE XEROFORM 1X8 LF (GAUZE/BANDAGES/DRESSINGS) ×3 IMPLANT
GLOVE BIO SURGEON STRL SZ7.5 (GLOVE) ×6 IMPLANT
GLOVE BIO SURGEON STRL SZ8 (GLOVE) ×6 IMPLANT
GLOVE BIOGEL PI IND STRL 8 (GLOVE) ×2 IMPLANT
GLOVE BIOGEL PI INDICATOR 8 (GLOVE) ×1
GLOVE INDICATOR 8.0 STRL GRN (GLOVE) ×3 IMPLANT
GOWN STRL REUS W/ TWL LRG LVL3 (GOWN DISPOSABLE) ×2 IMPLANT
GOWN STRL REUS W/ TWL XL LVL3 (GOWN DISPOSABLE) ×2 IMPLANT
GOWN STRL REUS W/TWL LRG LVL3 (GOWN DISPOSABLE) ×3
GOWN STRL REUS W/TWL XL LVL3 (GOWN DISPOSABLE) ×3
GRASPER SUT 15 45D LOW PRO (SUTURE) IMPLANT
IV LACTATED RINGER IRRG 3000ML (IV SOLUTION) ×6
IV LR IRRIG 3000ML ARTHROMATIC (IV SOLUTION) ×4 IMPLANT
MANIFOLD NEPTUNE II (INSTRUMENTS) ×3 IMPLANT
MASK FACE SPIDER DISP (MASK) ×3 IMPLANT
MAT ABSORB  FLUID 56X50 GRAY (MISCELLANEOUS) ×1
MAT ABSORB FLUID 56X50 GRAY (MISCELLANEOUS) ×2 IMPLANT
PACK ARTHROSCOPY SHOULDER (MISCELLANEOUS) ×3 IMPLANT
SLING ARM LRG DEEP (SOFTGOODS) ×3 IMPLANT
SLING ULTRA II LG (MISCELLANEOUS) ×3 IMPLANT
STAPLER SKIN PROX 35W (STAPLE) ×3 IMPLANT
STRAP SAFETY 5IN WIDE (MISCELLANEOUS) ×3 IMPLANT
SUT ETHIBOND 0 MO6 C/R (SUTURE) ×3 IMPLANT
SUT VIC AB 2-0 CT1 27 (SUTURE) ×6
SUT VIC AB 2-0 CT1 TAPERPNT 27 (SUTURE) ×4 IMPLANT
TAPE MICROFOAM 4IN (TAPE) ×3 IMPLANT
TUBING ARTHRO INFLOW-ONLY STRL (TUBING) ×3 IMPLANT
TUBING CONNECTING 10 (TUBING) ×3 IMPLANT
WAND WEREWOLF FLOW 90D (MISCELLANEOUS) ×3 IMPLANT

## 2018-10-10 NOTE — H&P (Signed)
Paper H&P to be scanned into permanent record. H&P reviewed and patient re-examined. No changes. 

## 2018-10-10 NOTE — Anesthesia Postprocedure Evaluation (Signed)
Anesthesia Post Note  Patient: Krystal Woodward  Procedure(s) Performed: SHOULDER ARTHROSCOPY WITH OPEN ROTATOR CUFF REPAIR RIGHT (Right Shoulder)  Patient location during evaluation: PACU Anesthesia Type: General Level of consciousness: awake and alert Pain management: pain level controlled Vital Signs Assessment: post-procedure vital signs reviewed and stable Respiratory status: spontaneous breathing, nonlabored ventilation, respiratory function stable and patient connected to nasal cannula oxygen Cardiovascular status: blood pressure returned to baseline and stable Postop Assessment: no apparent nausea or vomiting Anesthetic complications: no     Last Vitals:  Vitals:   10/10/18 1310 10/10/18 1315  BP: (!) 111/59 115/64  Pulse: 60 61  Resp: 16 14  Temp: (!) 36.1 C (!) 36.3 C  SpO2: 95% 94%    Last Pain:  Vitals:   10/10/18 1315  TempSrc: Temporal  PainSc: Asleep                 Precious Haws Jesse Hirst

## 2018-10-10 NOTE — Anesthesia Procedure Notes (Signed)
Anesthesia Regional Block: Interscalene brachial plexus block   Pre-Anesthetic Checklist: ,, timeout performed, Correct Patient, Correct Site, Correct Laterality, Correct Procedure, Correct Position, site marked, Risks and benefits discussed,  Surgical consent,  Pre-op evaluation,  At surgeon's request and post-op pain management  Laterality: Upper and Right  Prep: chloraprep       Needles:  Injection technique: Single-shot  Needle Type: Stimiplex     Needle Length: 5cm  Needle Gauge: 22     Additional Needles:   Procedures:,,,, ultrasound used (permanent image in chart),,,,  Narrative:  Start time: 10/10/2018 9:27 AM End time: 10/10/2018 9:31 AM Injection made incrementally with aspirations every 5 mL.  Performed by: Personally  Anesthesiologist: Piscitello, Precious Haws, MD  Additional Notes: Patient consented for risk and benefits of nerve block including but not limited to nerve damage, failed block, bleeding and infection.  Patient voiced understanding.  Functioning IV was confirmed and monitors were applied.  A 79mm 22ga Stimuplex needle was used. Sterile prep,hand hygiene and sterile gloves were used.  Minimal sedation used for procedure.  No paresthesia endorsed by patient during the procedure.  Negative aspiration and negative test dose prior to incremental administration of local anesthetic. The patient tolerated the procedure well with no immediate complications.

## 2018-10-10 NOTE — Anesthesia Post-op Follow-up Note (Signed)
Anesthesia QCDR form completed.        

## 2018-10-10 NOTE — Discharge Instructions (Addendum)
Orthopedic discharge instructions: °Keep dressing dry and intact.  °May shower after dressing changed on post-op day #4 (Monday).  °Cover staples with Band-Aids after drying off. °Apply ice frequently to shoulder. °Take ibuprofen 600-800 mg TID with meals for 7-10 days, then as necessary. °Take oxycodone as prescribed when needed.  °May supplement with ES Tylenol if necessary. °Keep shoulder immobilizer on at all times except may remove for bathing purposes. °Follow-up in 10-14 days or as scheduled. ° °AMBULATORY SURGERY  °DISCHARGE INSTRUCTIONS ° ° °1) The drugs that you were given will stay in your system until tomorrow so for the next 24 hours you should not: ° °A) Drive an automobile °B) Make any legal decisions °C) Drink any alcoholic beverage ° ° °2) You may resume regular meals tomorrow.  Today it is better to start with liquids and gradually work up to solid foods. ° °You may eat anything you prefer, but it is better to start with liquids, then soup and crackers, and gradually work up to solid foods. ° ° °3) Please notify your doctor immediately if you have any unusual bleeding, trouble breathing, redness and pain at the surgery site, drainage, fever, or pain not relieved by medication. ° ° ° °4) Additional Instructions: ° ° ° ° ° ° ° °Please contact your physician with any problems or Same Day Surgery at [phone], Monday through Friday 6 am to 4 pm, or [place] [facility] number at [phone]. °

## 2018-10-10 NOTE — Anesthesia Procedure Notes (Signed)
Procedure Name: Intubation Date/Time: 10/10/2018 10:45 AM Performed by: Gentry Fitz, CRNA Pre-anesthesia Checklist: Patient identified, Emergency Drugs available, Suction available and Patient being monitored Patient Re-evaluated:Patient Re-evaluated prior to induction Oxygen Delivery Method: Circle system utilized Preoxygenation: Pre-oxygenation with 100% oxygen Induction Type: IV induction and Cricoid Pressure applied Ventilation: Mask ventilation without difficulty Laryngoscope Size: Mac and 4 Grade View: Grade II Tube type: Oral Number of attempts: 1 Airway Equipment and Method: Stylet Placement Confirmation: ETT inserted through vocal cords under direct vision,  positive ETCO2 and breath sounds checked- equal and bilateral Secured at: 22 cm Tube secured with: Tape Dental Injury: Teeth and Oropharynx as per pre-operative assessment

## 2018-10-10 NOTE — Anesthesia Preprocedure Evaluation (Signed)
Anesthesia Evaluation  Patient identified by MRN, date of birth, ID band Patient awake    Reviewed: Allergy & Precautions, H&P , NPO status , Patient's Chart, lab work & pertinent test results  History of Anesthesia Complications Negative for: history of anesthetic complications  Airway Mallampati: III  TM Distance: >3 FB Neck ROM: full    Dental  (+) Chipped   Pulmonary neg pulmonary ROS, neg shortness of breath,           Cardiovascular Exercise Tolerance: Good (-) angina(-) DOE + dysrhythmias      Neuro/Psych negative neurological ROS  negative psych ROS   GI/Hepatic Neg liver ROS, GERD  Medicated and Controlled,  Endo/Other  negative endocrine ROS  Renal/GU      Musculoskeletal   Abdominal   Peds  Hematology negative hematology ROS (+)   Anesthesia Other Findings Past Medical History: No date: Bradycardia     Comment:  mild-hr in high 50 range 01/24/2011: Breast mass No date: Chest pain No date: Elevated liver enzymes No date: GERD (gastroesophageal reflux disease)     Comment:  h/o [id number]: Hyperprolactinemia (HCC)     Comment:  Normal MRI No date: Vitamin D deficiency  Past Surgical History: 06/17/15: BREAST BIOPSY; Right     Comment:  Inflammed Fibrocyst 02/27/2011: BREAST CYST EXCISION     Comment:  Procedure: CYST EXCISION BREAST;  Surgeon: Haywood Lasso, MD;  Location: [facility];                Service: General;  Laterality: Left;  Needle localization              removal left breast mass No date: BREAST EXCISIONAL BIOPSY; Left No date: BUNIONECTOMY     Comment:  both feet 07/28/2015: INCISION AND DRAINAGE ABSCESS; Right     Comment:  Procedure: INCISION AND DRAINAGE RIGHT BREAST  ABSCESS;               Surgeon: Donnie Mesa, MD;  Location: [facility];  Service: General;  Laterality: Right; 1996: TUBAL LIGATION  BMI    Body  Mass Index: 30.82 kg/m      Reproductive/Obstetrics negative OB ROS                             Anesthesia Physical Anesthesia Plan  ASA: III  Anesthesia Plan: General ETT   Post-op Pain Management: GA combined w/ Regional for post-op pain   Induction: Intravenous  PONV Risk Score and Plan: Ondansetron, Dexamethasone, Midazolam and Treatment may vary due to age or medical condition  Airway Management Planned: Oral ETT  Additional Equipment:   Intra-op Plan:   Post-operative Plan: Extubation in OR  Informed Consent: I have reviewed the patients History and Physical, chart, labs and discussed the procedure including the risks, benefits and alternatives for the proposed anesthesia with the patient or authorized representative who has indicated his/her understanding and acceptance.     Dental Advisory Given  Plan Discussed with: Anesthesiologist, CRNA and Surgeon  Anesthesia Plan Comments: (Patient consented for risks of anesthesia including but not limited to:  - adverse reactions to medications - damage to teeth, lips or other oral mucosa - sore throat or hoarseness - Damage to heart, brain, lungs or loss of life  Patient voiced understanding.)        Anesthesia Quick Evaluation

## 2018-10-10 NOTE — Op Note (Signed)
10/10/2018  12:26 PM  Patient:   Krystal Woodward  Pre-Op Diagnosis:   Traumatic partial-thickness rotator cuff tear, right shoulder.  Post-Op Diagnosis:   Impingement/tendinopathy with medic full-thickness rotator cuff tear and biceps tendinopathy, right shoulder.  Procedure:   Limited arthroscopic debridement, arthroscopic subacromial decompression, mini-open rotator cuff repair, and mini-open biceps tenodesis, right shoulder.  Anesthesia:   General endotracheal with interscalene block placed preoperatively by the anesthesiologist.  Surgeon:   Maryagnes AmosJ. Jeffrey Jireh Vinas, MD  Assistant:   Horris LatinoLance McGhee, PA-C  Findings:   As above.  The labrum demonstrated mild fraying posterior superiorly, but otherwise was in satisfactory condition, as were the articular surfaces of the glenoid and humerus.  There was a full-thickness tear involving the entire supraspinatus insertion with only minimal retraction.  The remainder of the rotator cuff was in satisfactory condition.  The biceps demonstrated moderate tendinopathic changes with partial-thickness tearing.  Complications:   None  Fluids:   800 cc  Estimated blood loss:   10 cc  Tourniquet time:   None  Drains:   None  Closure:   Staples      Brief clinical note:   The patient is a 63 year old female with a history of progressively worsening right shoulder pain. The patient's symptoms have progressed despite medications, activity modification, etc. The patient's history and examination are consistent with impingement/tendinopathy with a rotator cuff tear. These findings were confirmed by MRI scan. The patient presents at this time for definitive management of these shoulder symptoms.  Procedure:   The patient underwent placement of an interscalene block by the anesthesiologist in the preoperative holding area before being brought into the operating room and lain in the supine position. The patient then underwent general endotracheal intubation and  anesthesia before being repositioned in the beach chair position using the beach chair positioner. The right shoulder and upper extremity were prepped with ChloraPrep solution before being draped sterilely. Preoperative antibiotics were administered. A timeout was performed to confirm the proper surgical site before the expected portal sites and incision site were injected with 0.5% Sensorcaine with epinephrine. A posterior portal was created and the glenohumeral joint thoroughly inspected with the findings as described above. An anterior portal was created using an outside-in technique. The labrum and rotator cuff were further probed, again confirming the above-noted findings. The areas of labral fraying and synovitis were debrided back to stable margins using the full-radius resector. The ArthroCare wand was inserted and used to release the biceps tendon from its labral anchor. It also was used to obtain hemostasis as well as to "anneal" the labrum superiorly and anteriorly. The instruments were removed from the joint after suctioning the excess fluid.  The camera was repositioned through the posterior portal into the subacromial space. A separate lateral portal was created using an outside-in technique. The 3.5 mm full-radius resector was introduced and used to perform a subtotal bursectomy. The ArthroCare wand was then inserted and used to remove the periosteal tissue off the undersurface of the anterior third of the acromion as well as to recess the coracoacromial ligament from its attachment along the anterior and lateral margins of the acromion. The 4.0 mm acromionizing bur was introduced and used to complete the decompression by removing the undersurface of the anterior third of the acromion. The full radius resector was reintroduced to remove any residual bony debris before the ArthroCare wand was reintroduced to obtain hemostasis. The instruments were then removed from the subacromial space after  suctioning the excess fluid.  An approximately 4-5 cm incision was made over the anterolateral aspect of the shoulder beginning at the anterolateral corner of the acromion and extending distally in line with the bicipital groove. This incision was carried down through the subcutaneous tissues to expose the deltoid fascia. The raphae between the anterior and middle thirds was identified and this plane developed to provide access into the subacromial space. Additional bursal tissues were debrided sharply using Metzenbaum scissors. The rotator cuff tear was readily identified. The margins were debrided sharply with a #15 blade and the exposed greater tuberosity roughened with a rongeur. The tear was repaired using two Biomet 2.9 mm JuggerKnot anchors. These sutures were then brought back laterally and secured using two [place] QuatroLink anchors to create a two-layer closure. An apparent watertight closure was obtained.  The bicipital groove was identified by palpation and opened for 1-1.5 cm. The biceps tendon stump was retrieved through this defect. The floor of the bicipital groove was roughened with a curet before another Biomet 2.9 mm JuggerKnot anchor was inserted. Both sets of sutures were passed through the biceps tendon and tied securely to effect the tenodesis. The bicipital sheath was reapproximated using two #0 Ethibond interrupted sutures, incorporating the biceps tendon to further reinforce the tenodesis.  The wound was copiously irrigated with sterile saline solution before the deltoid raphae was reapproximated using 2-0 Vicryl interrupted sutures. The subcutaneous tissues were closed in two layers using 2-0 Vicryl interrupted sutures before the skin was closed using staples. The portal sites also were closed using staples. A sterile bulky dressing was applied to the shoulder before the arm was placed into a shoulder immobilizer. The patient was then awakened, extubated, and returned to the  recovery room in satisfactory condition after tolerating the procedure well.

## 2018-10-10 NOTE — Transfer of Care (Signed)
Immediate Anesthesia Transfer of Care Note  Patient: Krystal Woodward  Procedure(s) Performed: SHOULDER ARTHROSCOPY WITH OPEN ROTATOR CUFF REPAIR RIGHT (Right Shoulder)  Patient Location: PACU  Anesthesia Type:General  Level of Consciousness: drowsy  Airway & Oxygen Therapy: Patient Spontanous Breathing and Patient connected to face mask oxygen  Post-op Assessment: Report given to RN and Post -op Vital signs reviewed and stable  Post vital signs: Reviewed and stable  Last Vitals:  Vitals Value Taken Time  BP 108/49 10/10/18 1229  Temp 36.2 C 10/10/18 1229  Pulse 56 10/10/18 1230  Resp 13 10/10/18 1230  SpO2 100 % 10/10/18 1230  Vitals shown include unvalidated device data.  Last Pain:  Vitals:   10/10/18 0921  TempSrc:   PainSc: 0-No pain         Complications: No apparent anesthesia complications

## 2018-10-11 ENCOUNTER — Encounter: Payer: Self-pay | Admitting: Surgery

## 2018-10-14 NOTE — Addendum Note (Signed)
Addendum  created 10/14/18 1146 by Doreen Salvage, CRNA   Charge Capture section accepted

## 2018-10-16 ENCOUNTER — Encounter: Payer: Self-pay | Admitting: Surgery

## 2019-01-10 ENCOUNTER — Other Ambulatory Visit: Payer: Self-pay | Admitting: Family Medicine

## 2019-01-10 DIAGNOSIS — Z1231 Encounter for screening mammogram for malignant neoplasm of breast: Secondary | ICD-10-CM

## 2019-01-16 ENCOUNTER — Encounter: Payer: Self-pay | Admitting: Physician Assistant

## 2019-01-16 ENCOUNTER — Ambulatory Visit: Payer: Commercial Managed Care - PPO | Admitting: Physician Assistant

## 2019-01-16 ENCOUNTER — Other Ambulatory Visit: Payer: Self-pay

## 2019-01-16 VITALS — BP 122/73 | HR 52 | Temp 96.8°F | Resp 16 | Wt 168.6 lb

## 2019-01-16 DIAGNOSIS — M62838 Other muscle spasm: Secondary | ICD-10-CM | POA: Diagnosis not present

## 2019-01-16 MED ORDER — METHOCARBAMOL 500 MG PO TABS: 500.0000 mg | ORAL_TABLET | Freq: Two times a day (BID) | ORAL | 0 refills | Status: AC | PRN

## 2019-01-16 NOTE — Progress Notes (Signed)
Patient: Krystal Woodward Female    DOB: 1955-11-28   63 y.o.   MRN: [id number] Visit Date: 01/16/2019  Today's Provider: Trey Sailors, PA-C   Chief Complaint  Patient presents with  . Neck Pain   Subjective:     Neck Pain  This is a new problem. The current episode started 1 to 4 weeks ago. The problem occurs daily. The problem has been gradually worsening. The pain is associated with nothing. The pain is present in the left side. The quality of the pain is described as stabbing. The pain is at a severity of 9/10. The pain is severe. The symptoms are aggravated by twisting. The pain is worse during the night. Pertinent negatives include no headaches or pain with swallowing. She has tried acetaminophen and NSAIDs for the symptoms. The treatment provided no relief.   History of torn rotator cuff on her right shoulder and had surgery to repair this several months prior. She is currently going through PT for this. Denies fevers, chills, nausea, vomiting. Continues to work as a Lawyer. Worse when sitting up. Has had flexeril previously and was very drowsy.   No Known Allergies   Current Outpatient Medications:  .  acetaminophen (TYLENOL) 500 MG tablet, Take 1,000 mg by mouth every 8 (eight) hours as needed (pain)., Disp: , Rfl:  .  aspirin 81 MG tablet, Take 81 mg by mouth daily., Disp: , Rfl:  .  Cholecalciferol (D-3-5) 5000 UNITS capsule, Take 10,000 Units by mouth daily. , Disp: , Rfl:  .  ibuprofen (ADVIL) 200 MG tablet, Take 600-800 mg by mouth every 8 (eight) hours as needed (pain)., Disp: , Rfl:  .  Multiple Vitamins-Minerals (CENTRUM SILVER ULTRA WOMENS PO), Take 1 capsule by mouth daily.  , Disp: , Rfl:  .  Omega-3 Fatty Acids (FISH OIL PO), Take 1,500 Units by mouth daily. , Disp: , Rfl:  .  TURMERIC CURCUMIN PO, Take 1 tablet by mouth daily., Disp: , Rfl:   Review of Systems  Musculoskeletal: Positive for neck pain.  Neurological: Negative for headaches.    Social  History   Tobacco Use  . Smoking status: Never Smoker  . Smokeless tobacco: Never Used  Substance Use Topics  . Alcohol use: No      Objective:   BP 122/73 (BP Location: Left Arm, Patient Position: Sitting, Cuff Size: Normal)   Pulse (!) 52   Temp (!) 96.8 F (36 C) (Temporal)   Resp 16   Wt 168 lb 9.6 oz (76.5 kg)   BMI 28.06 kg/m  Vitals:   01/16/19 1002  BP: 122/73  Pulse: (!) 52  Resp: 16  Temp: (!) 96.8 F (36 C)  TempSrc: Temporal  Weight: 168 lb 9.6 oz (76.5 kg)  Body mass index is 28.06 kg/m.   Physical Exam Constitutional:      Appearance: Normal appearance.  Neck:     Vascular: No carotid bruit.  Cardiovascular:     Rate and Rhythm: Normal rate.  Pulmonary:     Effort: Pulmonary effort is normal.  Musculoskeletal:     Comments: Muscle tension along trapezius  Skin:    General: Skin is warm and dry.  Neurological:     Mental Status: She is alert and oriented to person, place, and time. Mental status is at baseline.  Psychiatric:        Mood and Affect: Mood normal.        Behavior: Behavior normal.  No results found for any visits on 01/16/19.     Assessment & Plan    1. Muscle spasm  Muscle tension on exam, no strength deficit. May be due to recent issues with right rotator cuff and compensating with left arm. Will give robaxin as below, if it doesn't work she can try low dose flexeril. Counseled muscle relaxers will not instantly fix issue. Counseled physical therapy would also likely be helpful for this as well.  - methocarbamol (ROBAXIN) 500 MG tablet; Take 1 tablet (500 mg total) by mouth 2 (two) times daily as needed for muscle spasms.  Dispense: 30 tablet; Refill: 0  The entirety of the information documented in the History of Present Illness, Review of Systems and Physical Exam were personally obtained by me. Portions of this information were initially documented by Lynford Humphrey, CMA and reviewed by me for thoroughness and  accuracy.      Trinna Post, PA-C  [facility]

## 2019-01-16 NOTE — Patient Instructions (Signed)
Musculoskeletal Pain Musculoskeletal pain refers to aches and pains in your bones, joints, muscles, and the tissues that surround them. This pain can occur in any part of the body. It can last for a short time (acute) or a long time (chronic). A physical exam, lab tests, and imaging studies may be done to find the cause of your musculoskeletal pain. Follow these instructions at home:  Lifestyle  Try to control or lower your stress levels. Stress increases muscle tension and can worsen musculoskeletal pain. It is important to recognize when you are anxious or stressed and learn ways to manage it. This may include: ? Meditation or yoga. ? Cognitive or behavioral therapy. ? Acupuncture or massage therapy.  You may continue all activities unless the activities cause more pain. When the pain gets better, slowly resume your normal activities. Gradually increase the intensity and duration of your activities or exercise. Managing pain, stiffness, and swelling  Take over-the-counter and prescription medicines only as told by your health care provider.  When your pain is severe, bed rest may be helpful. Lie or sit in any position that is comfortable, but get out of bed and walk around at least every couple of hours.  If directed, apply heat to the affected area as often as told by your health care provider. Use the heat source that your health care provider recommends, such as a moist heat pack or a heating pad. ? Place a towel between your skin and the heat source. ? Leave the heat on for 20-30 minutes. ? Remove the heat if your skin turns bright red. This is especially important if you are unable to feel pain, heat, or cold. You may have a greater risk of getting burned.  If directed, put ice on the painful area. ? Put ice in a plastic bag. ? Place a towel between your skin and the bag. ? Leave the ice on for 20 minutes, 2-3 times a day. General instructions  Your health care provider may  recommend that you see a physical therapist. This person can help you come up with a safe exercise program. Do any exercises as told by your physical therapist.  Keep all follow-up visits, including any physical therapy visits, as told by your health care providers. This is important. Contact a health care provider if:  Your pain gets worse.  Medicines do not help ease your pain.  You cannot use the part of your body that hurts, such as your arm, leg, or neck.  You have trouble sleeping.  You have trouble doing your normal activities. Get help right away if:  You have a new injury and your pain is worse or different.  You feel numb or you have tingling in the painful area. Summary  Musculoskeletal pain refers to aches and pains in your bones, joints, muscles, and the tissues that surround them.  This pain can occur in any part of the body.  Your health care provider may recommend that you see a physical therapist. This person can help you come up with a safe exercise program. Do any exercises as told by your physical therapist.  Lower your stress level. Stress can worsen musculoskeletal pain. Ways to lower stress may include meditation, yoga, cognitive or behavioral therapy, acupuncture, and massage therapy. This information is not intended to replace advice given to you by your health care provider. Make sure you discuss any questions you have with your health care provider. Document Released: 04/10/2005 Document Revised: 03/23/2017 Document Reviewed:   05/10/2016 Elsevier Patient Education  2020 [employer] Inc.  

## 2019-01-20 ENCOUNTER — Telehealth: Payer: Self-pay | Admitting: *Deleted

## 2019-01-20 DIAGNOSIS — M25512 Pain in left shoulder: Secondary | ICD-10-CM

## 2019-01-20 MED ORDER — CYCLOBENZAPRINE HCL 5 MG PO TABS: 5.0000 mg | ORAL_TABLET | Freq: Three times a day (TID) | ORAL | 0 refills | Status: DC | PRN

## 2019-01-20 NOTE — Telephone Encounter (Signed)
Sent that in for her.  

## 2019-01-20 NOTE — Telephone Encounter (Signed)
Left message on voicemail medication was sent to the pharmacy.

## 2019-01-20 NOTE — Telephone Encounter (Signed)
Please advise 

## 2019-01-20 NOTE — Telephone Encounter (Signed)
Patient called to let Krystal Woodward know that the Robaxin isnot working. Patient wants to try flexeril instead. Please advise?

## 2019-01-21 ENCOUNTER — Other Ambulatory Visit: Payer: Self-pay

## 2019-01-21 ENCOUNTER — Ambulatory Visit (INDEPENDENT_AMBULATORY_CARE_PROVIDER_SITE_OTHER): Payer: Commercial Managed Care - PPO | Admitting: Physician Assistant

## 2019-01-21 ENCOUNTER — Other Ambulatory Visit (HOSPITAL_COMMUNITY)
Admission: RE | Admit: 2019-01-21 | Discharge: 2019-01-21 | Disposition: A | Discharge status: Home / Self Care | Payer: Commercial Managed Care - PPO | Source: Ambulatory Visit | Attending: Physician Assistant | Admitting: Physician Assistant

## 2019-01-21 ENCOUNTER — Encounter: Payer: Self-pay | Admitting: Physician Assistant

## 2019-01-21 VITALS — BP 113/73 | HR 52 | Temp 96.8°F | Wt 171.0 lb

## 2019-01-21 DIAGNOSIS — Z1239 Encounter for other screening for malignant neoplasm of breast: Secondary | ICD-10-CM | POA: Diagnosis not present

## 2019-01-21 DIAGNOSIS — Z833 Family history of diabetes mellitus: Secondary | ICD-10-CM | POA: Diagnosis not present

## 2019-01-21 DIAGNOSIS — Z1211 Encounter for screening for malignant neoplasm of colon: Secondary | ICD-10-CM

## 2019-01-21 DIAGNOSIS — Z0000 Encounter for general adult medical examination without abnormal findings: Secondary | ICD-10-CM

## 2019-01-21 DIAGNOSIS — Z124 Encounter for screening for malignant neoplasm of cervix: Secondary | ICD-10-CM | POA: Diagnosis not present

## 2019-01-21 DIAGNOSIS — Z114 Encounter for screening for human immunodeficiency virus [HIV]: Secondary | ICD-10-CM

## 2019-01-21 LAB — POCT GLYCOSYLATED HEMOGLOBIN (HGB A1C)
Est. average glucose Bld gHb Est-mCnc: 120
Hemoglobin A1C: 5.8 % — AB (ref 4.0–5.6)

## 2019-01-21 NOTE — Patient Instructions (Signed)
Health Maintenance, Female Adopting a healthy lifestyle and getting preventive care are important in promoting health and wellness. Ask your health care provider about:  The right schedule for you to have regular tests and exams.  Things you can do on your own to prevent diseases and keep yourself healthy. What should I know about diet, weight, and exercise? Eat a healthy diet   Eat a diet that includes plenty of vegetables, fruits, low-fat dairy products, and lean protein.  Do not eat a lot of foods that are high in solid fats, added sugars, or sodium. Maintain a healthy weight Body mass index (BMI) is used to identify weight problems. It estimates body fat based on height and weight. Your health care provider can help determine your BMI and help you achieve or maintain a healthy weight. Get regular exercise Get regular exercise. This is one of the most important things you can do for your health. Most adults should:  Exercise for at least 150 minutes each week. The exercise should increase your heart rate and make you sweat (moderate-intensity exercise).  Do strengthening exercises at least twice a week. This is in addition to the moderate-intensity exercise.  Spend less time sitting. Even light physical activity can be beneficial. Watch cholesterol and blood lipids Have your blood tested for lipids and cholesterol at 61 years of age, then have this test every 5 years. Have your cholesterol levels checked more often if:  Your lipid or cholesterol levels are high.  You are older than 61 years of age.  You are at high risk for heart disease. What should I know about cancer screening? Depending on your health history and family history, you may need to have cancer screening at various ages. This may include screening for:  Breast cancer.  Cervical cancer.  Colorectal cancer.  Skin cancer.  Lung cancer. What should I know about heart disease, diabetes, and high blood  pressure? Blood pressure and heart disease  High blood pressure causes heart disease and increases the risk of stroke. This is more likely to develop in people who have high blood pressure readings, are of African descent, or are overweight.  Have your blood pressure checked: ? Every 3-5 years if you are 18-39 years of age. ? Every year if you are 40 years old or older. Diabetes Have regular diabetes screenings. This checks your fasting blood sugar level. Have the screening done:  Once every three years after age 40 if you are at a normal weight and have a low risk for diabetes.  More often and at a younger age if you are overweight or have a high risk for diabetes. What should I know about preventing infection? Hepatitis B If you have a higher risk for hepatitis B, you should be screened for this virus. Talk with your health care provider to find out if you are at risk for hepatitis B infection. Hepatitis C Testing is recommended for:  Everyone born from 1945 through 1965.  Anyone with known risk factors for hepatitis C. Sexually transmitted infections (STIs)  Get screened for STIs, including gonorrhea and chlamydia, if: ? You are sexually active and are younger than 61 years of age. ? You are older than 61 years of age and your health care provider tells you that you are at risk for this type of infection. ? Your sexual activity has changed since you were last screened, and you are at increased risk for chlamydia or gonorrhea. Ask your health care provider if   you are at risk.  Ask your health care provider about whether you are at high risk for HIV. Your health care provider may recommend a prescription medicine to help prevent HIV infection. If you choose to take medicine to prevent HIV, you should first get tested for HIV. You should then be tested every 3 months for as long as you are taking the medicine. Pregnancy  If you are about to stop having your period (premenopausal) and  you may become pregnant, seek counseling before you get pregnant.  Take 400 to 800 micrograms (mcg) of folic acid every day if you become pregnant.  Ask for birth control (contraception) if you want to prevent pregnancy. Osteoporosis and menopause Osteoporosis is a disease in which the bones lose minerals and strength with aging. This can result in bone fractures. If you are 65 years old or older, or if you are at risk for osteoporosis and fractures, ask your health care provider if you should:  Be screened for bone loss.  Take a calcium or vitamin D supplement to lower your risk of fractures.  Be given hormone replacement therapy (HRT) to treat symptoms of menopause. Follow these instructions at home: Lifestyle  Do not use any products that contain nicotine or tobacco, such as cigarettes, e-cigarettes, and chewing tobacco. If you need help quitting, ask your health care provider.  Do not use street drugs.  Do not share needles.  Ask your health care provider for help if you need support or information about quitting drugs. Alcohol use  Do not drink alcohol if: ? Your health care provider tells you not to drink. ? You are pregnant, may be pregnant, or are planning to become pregnant.  If you drink alcohol: ? Limit how much you use to 0-1 drink a day. ? Limit intake if you are breastfeeding.  Be aware of how much alcohol is in your drink. In the [place] one drink equals one 12 oz bottle of beer (355 mL), one 5 oz glass of wine (148 mL), or one 1 oz glass of hard liquor (44 mL). General instructions  Schedule regular health, dental, and eye exams.  Stay current with your vaccines.  Tell your health care provider if: ? You often feel depressed. ? You have ever been abused or do not feel safe at home. Summary  Adopting a healthy lifestyle and getting preventive care are important in promoting health and wellness.  Follow your health care provider's instructions about healthy  diet, exercising, and getting tested or screened for diseases.  Follow your health care provider's instructions on monitoring your cholesterol and blood pressure. This information is not intended to replace advice given to you by your health care provider. Make sure you discuss any questions you have with your health care provider. Document Released: 10/24/2010 Document Revised: 04/03/2018 Document Reviewed: 04/03/2018 Elsevier Patient Education  2020 [employer]vier Inc.  

## 2019-01-21 NOTE — Progress Notes (Signed)
Patient: Krystal Woodward, Female    DOB: [birth year], 63 y.o.   MRN: [id number] Visit Date: 01/21/2019  Today's Provider: Trinna Post, PA-C   Chief Complaint  Patient presents with  . Annual Exam   Subjective:     Annual physical exam Sreenidhi Ganson is a 63 y.o. female who presents today for health maintenance and complete physical. She feels fairly well. She reports exercising regularly. She reports she is sleeping fairly well.  Family history positive for colon cancer. Last colonoscopy 05/2014 was normal with [facility]. She has been instructed to return every five years and she will be due in 05/2019.   Mammogram is scheduled in 02/2019. Mammogram 01/2018 normal.   PAP 12/2017 normal, HPV not performed.   She has been working on muscle tension in left shoulder with physical therapist. Bonne Dolores robaxin but did not work, flexeril sent in yesterday. Patient has not yet picked this up at the pharmacy.  ----------------------------------------------------------------    Review of Systems  Musculoskeletal: Positive for arthralgias.  All other systems reviewed and are negative.   Social History      She  reports that she has never smoked. She has never used smokeless tobacco. She reports that she does not drink alcohol or use drugs.       Social History   Socioeconomic History  . Marital status: Married    Spouse name: Not on file  . Number of children: 4  . Years of education: Not on file  . Highest education level: Not on file  Occupational History  . Occupation: CNA     Comment: [employer]  . Financial resource strain: Not on file  . Food insecurity    Worry: Not on file    Inability: Not on file  . Transportation needs    Medical: Not on file    Non-medical: Not on file  Tobacco Use  . Smoking status: Never Smoker  . Smokeless tobacco: Never Used  Substance and Sexual Activity  . Alcohol use: No  . Drug use: No  . Sexual activity:  Not on file  Lifestyle  . Physical activity    Days per week: Not on file    Minutes per session: Not on file  . Stress: Not on file  Relationships  . Social Herbalist on phone: Not on file    Gets together: Not on file    Attends religious service: Not on file    Active member of club or organization: Not on file    Attends meetings of clubs or organizations: Not on file    Relationship status: Not on file  Other Topics Concern  . Not on file  Social History Narrative  . Not on file    Past Medical History:  Diagnosis Date  . 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  . Breast mass 01/24/2011  . Chest pain   . Elevated liver enzymes   . GERD (gastroesophageal reflux disease)    h/o  . Hyperprolactinemia ([place]) [id number]   Normal MRI  . Vitamin D deficiency      Patient Active Problem List   Diagnosis Date Noted  . Bilateral cataracts 01/20/2018  . Post-menopausal 01/14/2016  . Pre-diabetes 01/06/2015  . Bilateral knee pain 01/04/2015  . Overweight 01/04/2015  . Hyperprolactinemia ([place]) 01/04/2015  . Fatigue 01/04/2015  . Family history of diabetes mellitus 01/04/2015  . Family history of colon  cancer 01/04/2015  . MURMUR 06/30/2010    Past Surgical History:  Procedure Laterality Date  . BREAST BIOPSY Right 06/17/15   Inflammed Fibrocyst  . BREAST CYST EXCISION  02/27/2011   Procedure: CYST EXCISION BREAST;  Surgeon: Currie Parishristian J Streck, MD;  Location: [facility];  Service: General;  Laterality: Left;  Needle localization removal left breast mass  . BREAST EXCISIONAL BIOPSY Left   . BUNIONECTOMY     both feet  . INCISION AND DRAINAGE ABSCESS Right 07/28/2015   Procedure: INCISION AND DRAINAGE RIGHT BREAST  ABSCESS;  Surgeon: Manus RuddMatthew Tsuei, MD;  Location: [facility];  Service: General;  Laterality: Right;  . SHOULDER ARTHROSCOPY WITH ROTATOR CUFF REPAIR AND OPEN BICEPS TENODESIS  10/10/2018   Procedure: SHOULDER ARTHROSCOPY  WITH OPEN ROTATOR CUFF REPAIR AND MINI OPEN BICEPS TENODESIS;  Surgeon: Christena FlakePoggi, John J, MD;  Location: [facility];  Service: Orthopedics;;  . SHOULDER ARTHROSCOPY WITH SUBACROMIAL DECOMPRESSION  10/10/2018   Procedure: SHOULDER ARTHROSCOPY WITH SUBACROMIAL DECOMPRESSION;  Surgeon: Christena FlakePoggi, John J, MD;  Location: [facility];  Service: Orthopedics;;  . TUBAL LIGATION  1996    Family History        Family Status  Relation Name Status  . Mother  Deceased at age 61  . Father  Deceased at age 61's  . Sister  Deceased  . PGM  (Not Specified)        Her family history includes Asthma in her paternal grandmother; Cancer in her mother; Cancer (age of onset: 5373) in her sister; Diabetes in her father and paternal grandmother; Transient ischemic attack in her mother.      No Known Allergies   Current Outpatient Medications:  .  acetaminophen (TYLENOL) 500 MG tablet, Take 1,000 mg by mouth every 8 (eight) hours as needed (pain)., Disp: , Rfl:  .  aspirin 81 MG tablet, Take 81 mg by mouth daily., Disp: , Rfl:  .  Cholecalciferol (D-3-5) 5000 UNITS capsule, Take 10,000 Units by mouth daily. , Disp: , Rfl:  .  cyclobenzaprine (FLEXERIL) 5 MG tablet, Take 1 tablet (5 mg total) by mouth 3 (three) times daily as needed for muscle spasms., Disp: 30 tablet, Rfl: 0 .  ibuprofen (ADVIL) 200 MG tablet, Take 600-800 mg by mouth every 8 (eight) hours as needed (pain)., Disp: , Rfl:  .  methocarbamol (ROBAXIN) 500 MG tablet, Take 1 tablet (500 mg total) by mouth 2 (two) times daily as needed for muscle spasms., Disp: 30 tablet, Rfl: 0 .  Multiple Vitamins-Minerals (CENTRUM SILVER ULTRA WOMENS PO), Take 1 capsule by mouth daily.  , Disp: , Rfl:  .  Omega-3 Fatty Acids (FISH OIL PO), Take 1,500 Units by mouth daily. , Disp: , Rfl:  .  TURMERIC CURCUMIN PO, Take 1 tablet by mouth daily., Disp: , Rfl:    Patient Care Team: Malva LimesFisher, Donald E, MD as PCP - General (Family Medicine) Poggi, Excell SeltzerJohn J, MD as Consulting Physician  (Surgery)    Objective:    Vitals: BP 113/73 (BP Location: Left Arm, Patient Position: Sitting, Cuff Size: Large)   Pulse (!) 52   Temp (!) 96.8 F (36 C) (Temporal)   Wt 171 lb (77.6 kg)   BMI 28.46 kg/m    Vitals:   01/21/19 1011  BP: 113/73  Pulse: (!) 52  Temp: (!) 96.8 F (36 C)  TempSrc: Temporal  Weight: 171 lb (77.6 kg)     Physical Exam Exam conducted with a chaperone present.  Constitutional:      Appearance: Normal appearance.  Cardiovascular:     Rate and Rhythm: Normal rate and regular rhythm.     Heart sounds: Normal heart sounds.  Pulmonary:     Effort: Pulmonary effort is normal.     Breath sounds: Normal breath sounds.  Abdominal:     General: Abdomen is flat. Bowel sounds are normal.     Palpations: Abdomen is soft.  Genitourinary:    Exam position: Lithotomy position.     Cervix: Normal.  Skin:    General: Skin is warm and dry.  Neurological:     Mental Status: She is alert and oriented to person, place, and time. Mental status is at baseline.  Psychiatric:        Mood and Affect: Mood normal.        Behavior: Behavior normal.      Depression Screen PHQ 2/9 Scores 01/21/2019 01/18/2018 01/16/2017 01/10/2016  PHQ - 2 Score 0 0 0 0  PHQ- 9 Score - 0 - 0       Assessment & Plan:     Routine Health Maintenance and Physical Exam  Exercise Activities and Dietary recommendations Goals   None     Immunization History  Administered Date(s) Administered  . Influenza,inj,Quad PF,6+ Mos 01/16/2017, 01/18/2018, 12/31/2018  . Tdap 01/28/2010    Health Maintenance  Topic Date Due  . HIV Screening  06/04/1972  . TETANUS/TDAP  01/29/2020  . MAMMOGRAM  02/08/2020  . PAP SMEAR-Modifier  01/18/2021  . COLONOSCOPY  06/01/2024  . INFLUENZA VACCINE  Completed  . Hepatitis C Screening  Completed     Discussed health benefits of physical activity, and encouraged her to engage in regular exercise appropriate for her age and condition.      1. Annual physical exam  - Comprehensive Metabolic Panel (CMET) - TSH - CBC with Differential - Lipid Profile - Direct LDL  2. Cervical cancer screening  - Cytology - PAP  3. Breast cancer screening   4. Colon cancer screening  - Ambulatory referral to Gastroenterology  5. Encounter for screening for HIV  - HIV antibody (with reflex)  6. Family history of diet-controlled diabetes  - POCT glycosylated hemoglobin (Hb A1C)  The entirety of the information documented in the History of Present Illness, Review of Systems and Physical Exam were personally obtained by me. Portions of this information were initially documented by Kavin Leech, CMA and reviewed by me for thoroughness and accuracy.   --------------------------------------------------------------------    Trey Sailors, PA-C  [facility]

## 2019-01-22 LAB — COMPREHENSIVE METABOLIC PANEL
ALT: 13 IU/L (ref 0–32)
AST: 16 IU/L (ref 0–40)
Albumin/Globulin Ratio: 1.6 (ref 1.2–2.2)
Albumin: 4.2 g/dL (ref 3.8–4.8)
Alkaline Phosphatase: 152 IU/L — ABNORMAL HIGH (ref 39–117)
BUN/Creatinine Ratio: 14 (ref 12–28)
BUN: 15 mg/dL (ref 8–27)
Bilirubin Total: <0.2 mg/dL (ref 0.0–1.2)
CO2: 22 mmol/L (ref 20–29)
Calcium: 9.8 mg/dL (ref 8.7–10.3)
Chloride: 106 mmol/L (ref 96–106)
Creatinine, Ser: 1.05 mg/dL — ABNORMAL HIGH (ref 0.57–1.00)
GFR calc Af Amer: 66 mL/min/{1.73_m2} (ref >59)
GFR calc non Af Amer: 57 mL/min/{1.73_m2} — ABNORMAL LOW (ref >59)
Globulin, Total: 2.6 g/dL (ref 1.5–4.5)
Glucose: 88 mg/dL (ref 65–99)
Potassium: 4.1 mmol/L (ref 3.5–5.2)
Sodium: 142 mmol/L (ref 134–144)
Total Protein: 6.8 g/dL (ref 6.0–8.5)

## 2019-01-22 LAB — CBC WITH DIFFERENTIAL/PLATELET
Basophils Absolute: 0.1 10*3/uL (ref 0.0–0.2)
Basos: 1 %
EOS (ABSOLUTE): 0.3 10*3/uL (ref 0.0–0.4)
Eos: 4 %
Hematocrit: 37.6 % (ref 34.0–46.6)
Hemoglobin: 12.2 g/dL (ref 11.1–15.9)
Immature Grans (Abs): 0 10*3/uL (ref 0.0–0.1)
Immature Granulocytes: 0 %
Lymphocytes Absolute: 2.4 10*3/uL (ref 0.7–3.1)
Lymphs: 32 %
MCH: 31 pg (ref 26.6–33.0)
MCHC: 32.4 g/dL (ref 31.5–35.7)
MCV: 96 fL (ref 79–97)
Monocytes Absolute: 0.4 10*3/uL (ref 0.1–0.9)
Monocytes: 6 %
Neutrophils Absolute: 4.4 10*3/uL (ref 1.4–7.0)
Neutrophils: 57 %
Platelets: 265 10*3/uL (ref 150–450)
RBC: 3.93 x10E6/uL (ref 3.77–5.28)
RDW: 12.3 % (ref 11.7–15.4)
WBC: 7.6 10*3/uL (ref 3.4–10.8)

## 2019-01-22 LAB — LIPID PANEL
Chol/HDL Ratio: 3.1 ratio (ref 0.0–4.4)
Cholesterol, Total: 135 mg/dL (ref 100–199)
HDL: 43 mg/dL (ref >39)
LDL Chol Calc (NIH): 74 mg/dL (ref 0–99)
Triglycerides: 96 mg/dL (ref 0–149)
VLDL Cholesterol Cal: 18 mg/dL (ref 5–40)

## 2019-01-22 LAB — LDL CHOLESTEROL, DIRECT: LDL Direct: 81 mg/dL (ref 0–99)

## 2019-01-22 LAB — TSH: TSH: 3.04 u[IU]/mL (ref 0.450–4.500)

## 2019-01-22 LAB — HIV ANTIBODY (ROUTINE TESTING W REFLEX): HIV Screen 4th Generation wRfx: NONREACTIVE

## 2019-01-27 ENCOUNTER — Telehealth: Payer: Self-pay

## 2019-01-27 LAB — CYTOLOGY - PAP
Diagnosis: NEGATIVE
High risk HPV: NEGATIVE

## 2019-01-27 NOTE — Telephone Encounter (Signed)
-----   Message from Trinna Post, [place] sent at 01/27/2019  3:12 PM EDT ----- PAP is normal and HPV is negative. We will repeat in 3-5 years.  Best, Fabio Bering

## 2019-01-27 NOTE — Telephone Encounter (Signed)
Patient advised as below. Patient verbalizes understanding and is in agreement with treatment plan.  

## 2019-02-26 ENCOUNTER — Ambulatory Visit: Payer: Commercial Managed Care - PPO

## 2019-02-27 ENCOUNTER — Other Ambulatory Visit: Payer: Self-pay

## 2019-02-27 DIAGNOSIS — Z20822 Contact with and (suspected) exposure to covid-19: Secondary | ICD-10-CM

## 2019-03-01 LAB — NOVEL CORONAVIRUS, NAA: SARS-CoV-2, NAA: NOT DETECTED

## 2019-04-21 ENCOUNTER — Ambulatory Visit
Admission: RE | Admit: 2019-04-21 | Discharge: 2019-04-21 | Disposition: A | Discharge status: Home / Self Care | Payer: Self-pay | Source: Ambulatory Visit | Attending: Family Medicine | Admitting: Family Medicine

## 2019-04-21 ENCOUNTER — Other Ambulatory Visit: Payer: Self-pay

## 2019-04-21 DIAGNOSIS — Z1231 Encounter for screening mammogram for malignant neoplasm of breast: Secondary | ICD-10-CM

## 2019-05-21 ENCOUNTER — Ambulatory Visit (INDEPENDENT_AMBULATORY_CARE_PROVIDER_SITE_OTHER): Payer: No Typology Code available for payment source | Admitting: Family Medicine

## 2019-05-21 ENCOUNTER — Other Ambulatory Visit: Payer: Self-pay

## 2019-05-21 VITALS — BP 136/70 | HR 63 | Temp 96.9°F | Wt 179.0 lb

## 2019-05-21 DIAGNOSIS — M7551 Bursitis of right shoulder: Secondary | ICD-10-CM

## 2019-05-21 DIAGNOSIS — M545 Low back pain, unspecified: Secondary | ICD-10-CM

## 2019-05-21 DIAGNOSIS — M25562 Pain in left knee: Secondary | ICD-10-CM | POA: Diagnosis not present

## 2019-05-21 MED ORDER — NAPROXEN 500 MG PO TABS: 500.0000 mg | ORAL_TABLET | Freq: Two times a day (BID) | ORAL | 0 refills | Status: DC

## 2019-05-21 NOTE — Progress Notes (Signed)
Krystal Woodward  MRN: [id number] DOB: Nov 23, 1955  Subjective:  Back Pain This is a new problem. The current episode started 1 to 4 weeks ago. The problem occurs intermittently. The problem has been gradually worsening since onset. The pain is present in the lumbar spine. The pain does not radiate. The pain is at a severity of 7/10 (currently no pain). The pain is worse during the day (when she first gets up it is the worst). The symptoms are aggravated by lying down. Stiffness is present in the morning. Pertinent negatives include no chest pain, headaches, leg pain, numbness, tingling or weakness. She has tried NSAIDs (Tylenol) for the symptoms. The treatment provided significant relief.    Back pain started about weeks ago. Leg pain, left-no known trauma. Back of leg hurts and she states it makes her feel like it is going to give out on her.  She states that both her knees also hurt. Standing makes her knees hurt but not her leg.  Walking makes her leg worse.  She has not taken any medication for the pain.  She states it has been gong on for about 1 month. Is out of cyclobenzaprine.   Right arm hurts  When she raises it. Has history of rotator cuff problems.        Social History   Socioeconomic History  . Marital status: Married    Spouse name: Not on file  . Number of children: 4  . Years of education: Not on file  . Highest education level: Not on file  Occupational History  . Occupation: CNA     Comment: [employer]  Tobacco Use  . Smoking status: Never Smoker  . Smokeless tobacco: Never Used  Substance and Sexual Activity  . Alcohol use: No  . Drug use: No  . Sexual activity: Not on file  Other Topics Concern  . Not on file  Social History Narrative  . Not on file   Social Determinants of Health   Financial Resource Strain:   . Difficulty of Paying Living Expenses: Not on file  Food Insecurity:   . Worried About Programme researcher, broadcasting/film/video in the Last Year: Not on file  .  Ran Out of Food in the Last Year: Not on file  Transportation Needs:   . Lack of Transportation (Medical): Not on file  . Lack of Transportation (Non-Medical): Not on file  Physical Activity:   . Days of Exercise per Week: Not on file  . Minutes of Exercise per Session: Not on file  Stress:   . Feeling of Stress : Not on file  Social Connections:   . Frequency of Communication with Friends and Family: Not on file  . Frequency of Social Gatherings with Friends and Family: Not on file  . Attends Religious Services: Not on file  . Active Member of Clubs or Organizations: Not on file  . Attends Banker Meetings: Not on file  . Marital Status: Not on file  Intimate Partner Violence:   . Fear of Current or Ex-Partner: Not on file  . Emotionally Abused: Not on file  . Physically Abused: Not on file  . Sexually Abused: Not on file    Outpatient Encounter Medications as of 05/21/2019  Medication Sig  . acetaminophen (TYLENOL) 500 MG tablet Take 1,000 mg by mouth every 8 (eight) hours as needed (pain).  Marland Kitchen aspirin 81 MG tablet Take 81 mg by mouth daily.  . Cholecalciferol (D-3-5) 5000 UNITS capsule Take  10,000 Units by mouth daily.   . cyclobenzaprine (FLEXERIL) 5 MG tablet Take 1 tablet (5 mg total) by mouth 3 (three) times daily as needed for muscle spasms.  Marland Kitchen ibuprofen (ADVIL) 200 MG tablet Take 600-800 mg by mouth every 8 (eight) hours as needed (pain).  . Multiple Vitamins-Minerals (CENTRUM SILVER ULTRA WOMENS PO) Take 1 capsule by mouth daily.    . Omega-3 Fatty Acids (FISH OIL PO) Take 1,500 Units by mouth daily.   . TURMERIC CURCUMIN PO Take 1 tablet by mouth daily.   No facility-administered encounter medications on file as of 05/21/2019.    No Known Allergies  Review of Systems  Cardiovascular: Negative for chest pain.  Musculoskeletal: Positive for back pain.  Neurological: Negative for tingling, weakness, numbness and headaches.    Objective:  BP 136/70 (BP  Location: Right Arm, Patient Position: Sitting, Cuff Size: Normal)   Pulse 63   Temp (!) 96.9 F (36.1 C) (Skin)   Wt 179 lb (81.2 kg)   SpO2 99%   BMI 29.79 kg/m   Physical Exam   General: Appearance:     Overweight female in no acute distress  Eyes:    PERRL, conjunctiva/corneas clear, EOM's intact       Lungs:     Clear to auscultation bilaterally, respirations unlabored  Heart:    Normal heart rate. Normal rhythm.  No murmurs, rubs, or gallops.   MS:   All extremities are intact.  Tender over mid and lower lumbar spine. Tender left upper popliteal fossae. Pain with abduction >90 degrees right shoulder.   Neurologic:   Awake, alert, oriented x 3. No apparent focal neurological           defect.         Assessment and Plan :   1. Acute midline low back pain without sciatica  - naproxen (NAPROSYN) 500 MG tablet; Take 1 tablet (500 mg total) by mouth 2 (two) times daily with a meal.  Dispense: 30 tablet; Refill: 0 Apply ice 2-3 times a day for the next 3-4 days.   2. Subacromial bursitis of right shoulder joint Advised she will likely need to follow up with her orthopedist unless improves with naproxen.   3. Acute pain of left knee Likely small popliteal cyst from oa. Expect some improvement with naproxen. Call otherwise.

## 2019-06-09 ENCOUNTER — Telehealth: Payer: Self-pay

## 2019-06-09 DIAGNOSIS — Z800 Family history of malignant neoplasm of digestive organs: Secondary | ICD-10-CM

## 2019-06-10 NOTE — Telephone Encounter (Signed)
Sorry I thought the CRM had copied over. I guess not  Source Subject Topic  Renelda Mom (Patient) Renelda Mom (Patient) Referral - Request for Referral  Summary: referral request  Has patient seen PCP for this complaint? no  *If NO, is insurance requiring patient see PCP for this issue before PCP can refer them?  Referral for which specialty: gastroenterology  Preferred provider/office: unknown, but she did see someone 5 years ago  Reason for referral: 5 year follow up colonoscopy    PT can be reached at [phone]

## 2019-06-16 ENCOUNTER — Encounter: Payer: Self-pay | Admitting: Physician Assistant

## 2019-06-16 ENCOUNTER — Other Ambulatory Visit (HOSPITAL_COMMUNITY)
Admission: RE | Admit: 2019-06-16 | Discharge: 2019-06-16 | Disposition: A | Discharge status: Home / Self Care | Payer: No Typology Code available for payment source | Source: Ambulatory Visit | Attending: Physician Assistant | Admitting: Physician Assistant

## 2019-06-16 ENCOUNTER — Ambulatory Visit (INDEPENDENT_AMBULATORY_CARE_PROVIDER_SITE_OTHER): Payer: No Typology Code available for payment source | Admitting: Physician Assistant

## 2019-06-16 ENCOUNTER — Other Ambulatory Visit: Payer: Self-pay

## 2019-06-16 VITALS — BP 138/78 | Temp 96.8°F | Wt 182.4 lb

## 2019-06-16 DIAGNOSIS — R3989 Other symptoms and signs involving the genitourinary system: Secondary | ICD-10-CM

## 2019-06-16 DIAGNOSIS — R102 Pelvic and perineal pain: Secondary | ICD-10-CM

## 2019-06-16 DIAGNOSIS — R300 Dysuria: Secondary | ICD-10-CM

## 2019-06-16 LAB — POCT URINALYSIS DIPSTICK
Bilirubin, UA: NEGATIVE
Blood, UA: NEGATIVE
Glucose, UA: NEGATIVE
Ketones, UA: NEGATIVE
Leukocytes, UA: NEGATIVE
Nitrite, UA: NEGATIVE
Protein, UA: NEGATIVE
Spec Grav, UA: >=1.03 — AB (ref 1.010–1.025)
Urobilinogen, UA: 0.2 E.U./dL
pH, UA: 5 (ref 5.0–8.0)

## 2019-06-16 MED ORDER — SULFAMETHOXAZOLE-TRIMETHOPRIM 800-160 MG PO TABS: 1.0000 | ORAL_TABLET | Freq: Two times a day (BID) | ORAL | 0 refills | Status: AC

## 2019-06-16 NOTE — Progress Notes (Signed)
Patient: Krystal Woodward Female    DOB: 05-14-1955   64 y.o.   MRN: [id number] Visit Date: 06/16/2019  Today's Provider: Trey Sailors, PA-C   Chief Complaint  Patient presents with  . Urinary Frequency   Subjective:     Urinary Frequency  This is a new problem. The current episode started in the past 7 days. The problem occurs every urination. There has been no fever. Associated symptoms include a discharge and frequency. Pertinent negatives include no chills, hesitancy, nausea, urgency or vomiting. She has tried nothing for the symptoms. The treatment provided no relief.   Patient reports 3-4 day history of pelvic pain and burning for which she tried monistat vaginal cream. This was not helpful. She noticed some bleeding on tissue when she wiped after urinating a single time. No vaginal discharge. She is having an itching sensation. Did have sexual intercourse which was somewhat painful due to vaginal dryness and may not have used enough lubrication 1-2 weeks ago. Denies fever, chills, back pain, vomiting and nausea.   No Known Allergies   Current Outpatient Medications:  .  acetaminophen (TYLENOL) 500 MG tablet, Take 1,000 mg by mouth every 8 (eight) hours as needed (pain)., Disp: , Rfl:  .  aspirin 81 MG tablet, Take 81 mg by mouth daily., Disp: , Rfl:  .  Cholecalciferol (D-3-5) 5000 UNITS capsule, Take 10,000 Units by mouth daily. , Disp: , Rfl:  .  ibuprofen (ADVIL) 200 MG tablet, Take 600-800 mg by mouth every 8 (eight) hours as needed (pain)., Disp: , Rfl:  .  Multiple Vitamins-Minerals (CENTRUM SILVER ULTRA WOMENS PO), Take 1 capsule by mouth daily.  , Disp: , Rfl:  .  naproxen (NAPROSYN) 500 MG tablet, Take 1 tablet (500 mg total) by mouth 2 (two) times daily with a meal., Disp: 30 tablet, Rfl: 0 .  Omega-3 Fatty Acids (FISH OIL PO), Take 1,500 Units by mouth daily. , Disp: , Rfl:  .  TURMERIC CURCUMIN PO, Take 1 tablet by mouth daily., Disp: , Rfl:  .   cyclobenzaprine (FLEXERIL) 5 MG tablet, Take 1 tablet (5 mg total) by mouth 3 (three) times daily as needed for muscle spasms. (Patient not taking: Reported on 06/16/2019), Disp: 30 tablet, Rfl: 0  Review of Systems  Constitutional: Negative for chills.  Gastrointestinal: Negative for nausea and vomiting.  Genitourinary: Positive for frequency. Negative for hesitancy and urgency.    Social History   Tobacco Use  . Smoking status: Never Smoker  . Smokeless tobacco: Never Used  Substance Use Topics  . Alcohol use: No      Objective:   There were no vitals taken for this visit. There were no vitals filed for this visit.There is no height or weight on file to calculate BMI.   Physical Exam Constitutional:      Appearance: Normal appearance.  Cardiovascular:     Rate and Rhythm: Normal rate and regular rhythm.     Heart sounds: Normal heart sounds.  Pulmonary:     Effort: Pulmonary effort is normal.     Breath sounds: Normal breath sounds.  Abdominal:     General: Bowel sounds are normal.     Palpations: Abdomen is soft.     Tenderness: There is no abdominal tenderness.  Skin:    General: Skin is warm and dry.  Neurological:     Mental Status: She is alert and oriented to person, place, and time. Mental status is at  baseline.  Psychiatric:        Mood and Affect: Mood normal.        Behavior: Behavior normal.      No results found for any visits on 06/16/19.     Assessment & Plan    1. Possible urinary tract infection  PAP smear 12/2018 was normal. Urine dipstick looks normal today and so will culture. Will also have patient self collect vaginal swab.   - POCT urinalysis dipstick - sulfamethoxazole-trimethoprim (BACTRIM DS) 800-160 MG tablet; Take 1 tablet by mouth 2 (two) times daily for 3 days.  Dispense: 6 tablet; Refill: 0  2. Dysuria  - CULTURE, URINE COMPREHENSIVE - sulfamethoxazole-trimethoprim (BACTRIM DS) 800-160 MG tablet; Take 1 tablet by mouth 2 (two)  times daily for 3 days.  Dispense: 6 tablet; Refill: 0  3. Pelvic pain  - Cervicovaginal ancillary only  The entirety of the information documented in the History of Present Illness, Review of Systems and Physical Exam were personally obtained by me. Portions of this information were initially documented by [facility] and reviewed by me for thoroughness and accuracy.       Trinna Post, PA-C  [facility]

## 2019-06-18 LAB — CERVICOVAGINAL ANCILLARY ONLY
Bacterial Vaginitis (gardnerella): NEGATIVE
Candida Glabrata: NEGATIVE
Candida Vaginitis: NEGATIVE
Chlamydia: NEGATIVE
Comment: NEGATIVE
Comment: NEGATIVE
Comment: NEGATIVE
Comment: NEGATIVE
Comment: NEGATIVE
Comment: NORMAL
Neisseria Gonorrhea: NEGATIVE
Trichomonas: NEGATIVE

## 2019-06-18 LAB — CULTURE, URINE COMPREHENSIVE

## 2019-06-18 NOTE — Patient Instructions (Signed)

## 2019-06-25 ENCOUNTER — Telehealth: Payer: Self-pay

## 2019-06-25 NOTE — Telephone Encounter (Signed)
Patients call has been returned.  She had 2 referrals in for colonoscopy from 2 different dates.  She is currently schedule on May 19th with Dr. Norma Fredrickson.  I asked her to please call me back to let me know if she would like to keep the colonoscopy as scheduled with Dr. Norma Fredrickson or schedule with [place].  Thanks,  [place],  [place]

## 2019-07-28 ENCOUNTER — Other Ambulatory Visit: Payer: Self-pay

## 2019-07-28 ENCOUNTER — Ambulatory Visit: Payer: No Typology Code available for payment source | Attending: Internal Medicine

## 2019-07-28 DIAGNOSIS — Z23 Encounter for immunization: Secondary | ICD-10-CM

## 2019-07-28 NOTE — Progress Notes (Signed)
   Covid-19 Vaccination Clinic  Name:  Krystal Woodward    MRN: [id number] DOB: 1955/12/30  07/28/2019  Krystal Woodward was observed post Covid-19 immunization for 15 minutes without incident. She was provided with Vaccine Information Sheet and instruction to access the V-Safe system.   Krystal Woodward was instructed to call 911 with any severe reactions post vaccine: Marland Kitchen Difficulty breathing  . Swelling of face and throat  . A fast heartbeat  . A bad rash all over body  . Dizziness and weakness   Immunizations Administered    Name Date Dose VIS Date Route   Pfizer COVID-19 Vaccine 07/28/2019  1:00 PM 0.3 mL 04/04/2019 Intramuscular   Manufacturer: [employer], [employer]   Lot: [id number]   NDC: [address]-8

## 2019-08-19 ENCOUNTER — Ambulatory Visit: Payer: No Typology Code available for payment source | Attending: Internal Medicine

## 2019-08-19 DIAGNOSIS — Z23 Encounter for immunization: Secondary | ICD-10-CM

## 2019-08-19 NOTE — Progress Notes (Signed)
   Covid-19 Vaccination Clinic  Name:  Zoei Amison    MRN: [id number] DOB: May 20, 1955  08/19/2019  Ms. Epping was observed post Covid-19 immunization for 15 minutes without incident. She was provided with Vaccine Information Sheet and instruction to access the V-Safe system.   Ms. Venturini was instructed to call 911 with any severe reactions post vaccine: Marland Kitchen Difficulty breathing  . Swelling of face and throat  . A fast heartbeat  . A bad rash all over body  . Dizziness and weakness   Immunizations Administered    Name Date Dose VIS Date Route   Pfizer COVID-19 Vaccine 08/19/2019  3:06 PM 0.3 mL 06/18/2018 Intramuscular   Manufacturer: [employer], [employer]   Lot: [id number]   NDC: [address]-4

## 2019-09-03 ENCOUNTER — Telehealth: Payer: Self-pay | Admitting: *Deleted

## 2019-09-03 DIAGNOSIS — H9190 Unspecified hearing loss, unspecified ear: Secondary | ICD-10-CM

## 2019-09-03 NOTE — Telephone Encounter (Signed)
Copied from CRM [id number]. Topic: General - Other >> Sep 03, 2019  8:12 AM Jaquita Rector A wrote: Reason for CRM: Patient called to inform Dr Sherrie Mustache that she have an appointment with [facility] in [place] at 9 AM this morning but need a referral sent over per her insurance. Please advise Pt can be reached at Ph# [phone]

## 2019-09-03 NOTE — Telephone Encounter (Signed)
Referral placed.

## 2019-09-03 NOTE — Telephone Encounter (Signed)
I called and advised patient that Dr. Sherrie Mustache is out of the office until Friday. Patient asked if I would ask Ricki Rodriguez to send a referral order to [facility] in [place]? She is already an established patient at [facility]. She says she wears hearing aids and is in with the doctor now for a follow up visit for hearing evaluation. Her insurance requires a referral from her PCP whenever she sees a specialist. Patient would like for Ricki Rodriguez to send a referral over today so that her visit will be covered by her insurance. Please advise.   Phone number to [facility]:  [phone]  Fax number: [phone]

## 2019-09-03 NOTE — Telephone Encounter (Signed)
Referral order placed. Please send referral info to [facility] in [place].

## 2019-09-05 ENCOUNTER — Ambulatory Visit
Admission: RE | Admit: 2019-09-05 | Discharge: 2019-09-05 | Disposition: A | Discharge status: Home / Self Care | Payer: Disability Insurance | Source: Ambulatory Visit | Attending: Dentistry | Admitting: Dentistry

## 2019-09-05 ENCOUNTER — Other Ambulatory Visit: Payer: Self-pay | Admitting: Dentistry

## 2019-09-05 ENCOUNTER — Other Ambulatory Visit: Payer: Self-pay

## 2019-09-05 DIAGNOSIS — R52 Pain, unspecified: Secondary | ICD-10-CM | POA: Diagnosis present

## 2019-09-08 ENCOUNTER — Other Ambulatory Visit
Admission: RE | Admit: 2019-09-08 | Discharge: 2019-09-08 | Disposition: A | Discharge status: Home / Self Care | Payer: No Typology Code available for payment source | Source: Ambulatory Visit | Attending: Internal Medicine | Admitting: Internal Medicine

## 2019-09-08 ENCOUNTER — Other Ambulatory Visit: Payer: Self-pay

## 2019-09-08 DIAGNOSIS — Z01812 Encounter for preprocedural laboratory examination: Secondary | ICD-10-CM | POA: Diagnosis present

## 2019-09-08 DIAGNOSIS — Z20822 Contact with and (suspected) exposure to covid-19: Secondary | ICD-10-CM | POA: Insufficient documentation

## 2019-09-08 LAB — SARS CORONAVIRUS 2 (TAT 6-24 HRS): SARS Coronavirus 2: NEGATIVE

## 2019-09-10 ENCOUNTER — Ambulatory Visit
Admission: RE | Admit: 2019-09-10 | Discharge: 2019-09-10 | Disposition: A | Discharge status: Home / Self Care | Payer: No Typology Code available for payment source | Source: Ambulatory Visit | Attending: Internal Medicine | Admitting: Internal Medicine

## 2019-09-10 ENCOUNTER — Ambulatory Visit: Payer: No Typology Code available for payment source | Admitting: Anesthesiology

## 2019-09-10 ENCOUNTER — Encounter
Admission: RE | Disposition: A | Discharge status: Home / Self Care | Payer: Self-pay | Source: Ambulatory Visit | Attending: Internal Medicine

## 2019-09-10 DIAGNOSIS — Z7982 Long term (current) use of aspirin: Secondary | ICD-10-CM | POA: Insufficient documentation

## 2019-09-10 DIAGNOSIS — K640 First degree hemorrhoids: Secondary | ICD-10-CM | POA: Insufficient documentation

## 2019-09-10 DIAGNOSIS — Z1211 Encounter for screening for malignant neoplasm of colon: Secondary | ICD-10-CM | POA: Diagnosis present

## 2019-09-10 DIAGNOSIS — Z791 Long term (current) use of non-steroidal anti-inflammatories (NSAID): Secondary | ICD-10-CM | POA: Insufficient documentation

## 2019-09-10 DIAGNOSIS — Z800 Family history of malignant neoplasm of digestive organs: Secondary | ICD-10-CM | POA: Diagnosis not present

## 2019-09-10 HISTORY — PX: COLONOSCOPY WITH PROPOFOL: SHX5780

## 2019-09-10 SURGERY — COLONOSCOPY WITH PROPOFOL
Anesthesia: General

## 2019-09-10 MED ORDER — SODIUM CHLORIDE 0.9 % IV SOLN
INTRAVENOUS | Status: DC
  Administered 2019-09-10: 1000 mL via INTRAVENOUS

## 2019-09-10 MED ORDER — PROPOFOL 500 MG/50ML IV EMUL
INTRAVENOUS | Status: DC | PRN
  Administered 2019-09-10: 120 ug/kg/min via INTRAVENOUS

## 2019-09-10 MED ORDER — PROPOFOL 500 MG/50ML IV EMUL
INTRAVENOUS | Status: AC
  Filled 2019-09-10: qty 50

## 2019-09-10 NOTE — Op Note (Signed)
[facility] Gastroenterology Patient Name: Krystal Woodward Procedure Date: 09/10/2019 9:55 AM MRN: [id number] Account #: [id number] Date of Birth: 26-Jul-1955 Admit Type: Outpatient Age: 64 Room: [facility] ENDO ROOM 3 Gender: Female Note Status: Finalized Procedure:             Colonoscopy Indications:           Screening patient at increased risk: Family history of                         1st-degree relative with colorectal cancer at age 1                         years (or older) Providers:             Cleatis Fandrich K. Zinnia Tindall MD, MD Medicines:             Propofol per Anesthesia Complications:         No immediate complications. Procedure:             Pre-Anesthesia Assessment:                        - The risks and benefits of the procedure and the                         sedation options and risks were discussed with the                         patient. All questions were answered and informed                         consent was obtained.                        - Patient identification and proposed procedure were                         verified prior to the procedure by the nurse. The                         procedure was verified in the procedure room.                        - ASA Grade Assessment: III - A patient with severe                         systemic disease.                        - After reviewing the risks and benefits, the patient                         was deemed in satisfactory condition to undergo the                         procedure.                        After obtaining informed consent, the colonoscope was  passed under direct vision. Throughout the procedure,                         the patient's blood pressure, pulse, and oxygen                         saturations were monitored continuously. The                         Colonoscope was introduced through the anus and                         advanced to the the cecum,  identified by appendiceal                         orifice and ileocecal valve. The colonoscopy was                         performed without difficulty. The patient tolerated                         the procedure well. The quality of the bowel                         preparation was excellent. The ileocecal valve,                         appendiceal orifice, and rectum were photographed. Findings:      The perianal and digital rectal examinations were normal. Pertinent       negatives include normal sphincter tone and no palpable rectal lesions.      Non-bleeding internal hemorrhoids were found during retroflexion. The       hemorrhoids were Grade I (internal hemorrhoids that do not prolapse).      The entire examined colon appeared normal. Impression:            - Non-bleeding internal hemorrhoids.                        - The entire examined colon is normal.                        - No specimens collected. Recommendation:        - Patient has a contact number available for                         emergencies. The signs and symptoms of potential                         delayed complications were discussed with the patient.                         Return to normal activities tomorrow. Written                         discharge instructions were provided to the patient.                        - Resume previous diet.                        -  Continue present medications.                        - Repeat colonoscopy in 5 years for screening purposes.                        - Return to GI office PRN.                        - The findings and recommendations were discussed with                         the patient. Procedure Code(s):     --- Professional ---                        X7262, Colorectal cancer screening; colonoscopy on                         individual at high risk Diagnosis Code(s):     --- Professional ---                        K64.0, First degree hemorrhoids                         Z80.0, Family history of malignant neoplasm of                         digestive organs CPT copyright 2019 American Medical Association. All rights reserved. The codes documented in this report are preliminary and upon coder review may  be revised to meet current compliance requirements. Efrain Sella MD, MD 09/10/2019 10:30:29 AM This report has been signed electronically. Number of Addenda: 0 Note Initiated On: 09/10/2019 9:55 AM Scope Withdrawal Time: 0 hours 6 minutes 1 second  Total Procedure Duration: 0 hours 10 minutes 10 seconds  Estimated Blood Loss:  Estimated blood loss: none.      [facility]

## 2019-09-10 NOTE — H&P (Signed)
Outpatient short stay form Pre-procedure 09/10/2019 10:11 AM Krystal Woodward K. Krystal Woodward, M.D.  Primary Physician: Krystal Woodward, M.D.  Reason for visit: Family history of colon cancer  History of present illness:   64 year old patient presenting for family history of colon cancer. Patient denies any change in bowel habits, rectal bleeding or involuntary weight loss.     Current Facility-Administered Medications:  .  0.9 %  sodium chloride infusion, , Intravenous, Continuous, Krystal Woodward, Krystal Nearing, MD, Last Rate: 20 mL/hr at 09/10/19 0957, 1,000 mL at 09/10/19 0957  Medications Prior to Admission  Medication Sig Dispense Refill Last Dose  . acetaminophen (TYLENOL) 500 MG tablet Take 1,000 mg by mouth every 8 (eight) hours as needed (pain).   Past Week at Unknown time  . aspirin 81 MG tablet Take 81 mg by mouth daily.   Past Week at Unknown time  . Cholecalciferol (D-3-5) 5000 UNITS capsule Take 10,000 Units by mouth daily.    Past Week at Unknown time  . cyclobenzaprine (FLEXERIL) 5 MG tablet Take 1 tablet (5 mg total) by mouth 3 (three) times daily as needed for muscle spasms. 30 tablet 0 Past Week at Unknown time  . ibuprofen (ADVIL) 200 MG tablet Take 600-800 mg by mouth every 8 (eight) hours as needed (pain).   Past Week at Unknown time  . Multiple Vitamins-Minerals (CENTRUM SILVER ULTRA WOMENS PO) Take 1 capsule by mouth daily.     Past Week at Unknown time  . naproxen (NAPROSYN) 500 MG tablet Take 1 tablet (500 mg total) by mouth 2 (two) times daily with a meal. 30 tablet 0 Past Week at Unknown time  . Omega-3 Fatty Acids (FISH OIL PO) Take 1,500 Units by mouth daily.    Past Week at Unknown time  . TURMERIC CURCUMIN PO Take 1 tablet by mouth daily.   Past Week at Unknown time     No Known Allergies   Past Medical History:  Diagnosis Date  . 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  . Breast mass 01/24/2011  . Chest pain   . Elevated liver enzymes   . GERD (gastroesophageal reflux  disease)    h/o  . Hyperprolactinemia (HCC) [id number]   Normal MRI  . Vitamin D deficiency     Review of systems:  Otherwise negative.    Physical Exam  Gen: Alert, oriented. Appears stated age.  HEENT: [place]/AT. PERRLA. Lungs: CTA, no wheezes. CV: RR nl S1, S2. Abd: soft, benign, no masses. BS+ Ext: No edema. Pulses 2+    Planned procedures: Proceed with colonoscopy. The patient understands the nature of the planned procedure, indications, risks, alternatives and potential complications including but not limited to bleeding, infection, perforation, damage to internal organs and possible oversedation/side effects from anesthesia. The patient agrees and gives consent to proceed.  Please refer to procedure notes for findings, recommendations and patient disposition/instructions.     Krystal Woodward K. Krystal Woodward, M.D. Gastroenterology 09/10/2019  10:11 AM

## 2019-09-10 NOTE — Interval H&P Note (Deleted)
History and Physical Interval Note:  09/10/2019 10:06 AM  Krystal Woodward  has presented today for surgery, with the diagnosis of COLON SCREEN.  The various methods of treatment have been discussed with the patient and family. After consideration of risks, benefits and other options for treatment, the patient has consented to  Procedure(s): COLONOSCOPY WITH PROPOFOL (N/A) as a surgical intervention.  The patient's history has been reviewed, patient examined, no change in status, stable for surgery.  I have reviewed the patient's chart and labs.  Questions were answered to the patient's satisfaction.     [place], [place]

## 2019-09-10 NOTE — Anesthesia Preprocedure Evaluation (Signed)
Anesthesia Evaluation  Patient identified by MRN, date of birth, ID band Patient awake    Reviewed: Allergy & Precautions, H&P , NPO status , Patient's Chart, lab work & pertinent test results  Airway Mallampati: II  TM Distance: >3 FB Neck ROM: full    Dental  (+) Teeth Intact   Pulmonary neg pulmonary ROS, neg COPD,    Pulmonary exam normal        Cardiovascular (-) angina(-) Past MI and (-) Cardiac Stents negative cardio ROS Normal cardiovascular exam(-) dysrhythmias      Neuro/Psych negative neurological ROS  negative psych ROS   GI/Hepatic Neg liver ROS, GERD  ,  Endo/Other  negative endocrine ROS  Renal/GU negative Renal ROS  negative genitourinary   Musculoskeletal   Abdominal   Peds  Hematology negative hematology ROS (+)   Anesthesia Other Findings Past Medical History: No date: Bradycardia     Comment:  mild-hr in high 50 range 01/24/2011: Breast mass No date: Chest pain No date: Elevated liver enzymes No date: GERD (gastroesophageal reflux disease)     Comment:  h/o 3762;8315: Hyperprolactinemia ([place])     Comment:  Normal MRI No date: Vitamin D deficiency  Past Surgical History: 06/17/15: BREAST BIOPSY; Right     Comment:  Inflammed Fibrocyst 02/27/2011: BREAST CYST EXCISION     Comment:  Procedure: CYST EXCISION BREAST;  Surgeon: Haywood Lasso, MD;  Location: [place];                Service: General;  Laterality: Left;  Needle localization              removal left breast mass No date: BREAST EXCISIONAL BIOPSY; Left No date: BUNIONECTOMY     Comment:  both feet 07/28/2015: INCISION AND DRAINAGE ABSCESS; Right     Comment:  Procedure: INCISION AND DRAINAGE RIGHT BREAST  ABSCESS;               Surgeon: Donnie Mesa, MD;  Location: [place];  Service: General;  Laterality: Right; 10/10/2018: SHOULDER ARTHROSCOPY WITH ROTATOR CUFF  REPAIR AND OPEN  BICEPS TENODESIS     Comment:  Procedure: SHOULDER ARTHROSCOPY WITH OPEN ROTATOR CUFF               REPAIR AND MINI OPEN BICEPS TENODESIS;  Surgeon: Corky Mull, MD;  Location: [facility];  Service: Orthopedics;; 10/10/2018: SHOULDER ARTHROSCOPY WITH SUBACROMIAL DECOMPRESSION     Comment:  Procedure: SHOULDER ARTHROSCOPY WITH SUBACROMIAL               DECOMPRESSION;  Surgeon: Corky Mull, MD;  Location:               [facility];  Service: Orthopedics;; 1996: TUBAL LIGATION  BMI    Body Mass Index: 32.01 kg/m      Reproductive/Obstetrics negative OB ROS                             Anesthesia Physical Anesthesia Plan  ASA: II  Anesthesia Plan: General   Post-op Pain Management:    Induction:   PONV Risk Score and Plan: Propofol infusion and TIVA  Airway Management Planned: Natural Airway  and Nasal Cannula  Additional Equipment:   Intra-op Plan:   Post-operative Plan:   Informed Consent: I have reviewed the patients History and Physical, chart, labs and discussed the procedure including the risks, benefits and alternatives for the proposed anesthesia with the patient or authorized representative who has indicated his/her understanding and acceptance.     Dental Advisory Given  Plan Discussed with: Anesthesiologist, CRNA and Surgeon  Anesthesia Plan Comments:         Anesthesia Quick Evaluation

## 2019-09-10 NOTE — Transfer of Care (Signed)
Immediate Anesthesia Transfer of Care Note  Patient: Krystal Woodward  Procedure(s) Performed: COLONOSCOPY WITH PROPOFOL (N/A )  Patient Location: PACU and Endoscopy Unit  Anesthesia Type:General  Level of Consciousness: sedated  Airway & Oxygen Therapy: Patient Spontanous Breathing  Post-op Assessment: Report given to RN  Post vital signs: stable  Last Vitals:  Vitals Value Taken Time  BP    Temp    Pulse    Resp    SpO2      Last Pain:  Vitals:   09/10/19 0938  TempSrc: Temporal  PainSc: 0-No pain         Complications: No apparent anesthesia complications

## 2019-09-10 NOTE — H&P (Deleted)
  Outpatient short stay form Pre-procedure 09/10/2019 10:05 AM Londen Lorge K. Norma Fredrickson, M.D.  Primary Physician: [name], M.D.  Reason for visit:  Colon cancer screening  History of present illness: Patient presents for colonoscopy for colon cancer screening. The patient denies complaints of abdominal pain, significant change in bowel habits, or rectal bleeding.      Current Facility-Administered Medications:  .  0.9 %  sodium chloride infusion, , Intravenous, Continuous, [place], Boykin Nearing, MD, Last Rate: 20 mL/hr at 09/10/19 0957, 1,000 mL at 09/10/19 0957  Medications Prior to Admission  Medication Sig Dispense Refill Last Dose  . acetaminophen (TYLENOL) 500 MG tablet Take 1,000 mg by mouth every 8 (eight) hours as needed (pain).   Past Week at Unknown time  . aspirin 81 MG tablet Take 81 mg by mouth daily.   Past Week at Unknown time  . Cholecalciferol (D-3-5) 5000 UNITS capsule Take 10,000 Units by mouth daily.    Past Week at Unknown time  . cyclobenzaprine (FLEXERIL) 5 MG tablet Take 1 tablet (5 mg total) by mouth 3 (three) times daily as needed for muscle spasms. 30 tablet 0 Past Week at Unknown time  . ibuprofen (ADVIL) 200 MG tablet Take 600-800 mg by mouth every 8 (eight) hours as needed (pain).   Past Week at Unknown time  . Multiple Vitamins-Minerals (CENTRUM SILVER ULTRA WOMENS PO) Take 1 capsule by mouth daily.     Past Week at Unknown time  . naproxen (NAPROSYN) 500 MG tablet Take 1 tablet (500 mg total) by mouth 2 (two) times daily with a meal. 30 tablet 0 Past Week at Unknown time  . Omega-3 Fatty Acids (FISH OIL PO) Take 1,500 Units by mouth daily.    Past Week at Unknown time  . TURMERIC CURCUMIN PO Take 1 tablet by mouth daily.   Past Week at Unknown time     No Known Allergies   Past Medical History:  Diagnosis Date  . 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  . Breast mass 01/24/2011  . Chest pain   . Elevated liver enzymes   . GERD (gastroesophageal reflux  disease)    h/o  . Hyperprolactinemia (HCC) [id number]   Normal MRI  . Vitamin D deficiency     Review of systems:  Otherwise negative.    Physical Exam  Gen: Alert, oriented. Appears stated age.  HEENT: [place]/AT. PERRLA. Lungs: CTA, no wheezes. CV: RR nl S1, S2. Abd: soft, benign, no masses. BS+ Ext: No edema. Pulses 2+    Planned procedures: Proceed with colonoscopy. The patient understands the nature of the planned procedure, indications, risks, alternatives and potential complications including but not limited to bleeding, infection, perforation, damage to internal organs and possible oversedation/side effects from anesthesia. The patient agrees and gives consent to proceed.  Please refer to procedure notes for findings, recommendations and patient disposition/instructions.     Lateesha Bezold K. Norma Fredrickson, M.D. Gastroenterology 09/10/2019  10:05 AM

## 2019-09-10 NOTE — Interval H&P Note (Signed)
History and Physical Interval Note:  09/10/2019 10:12 AM  Krystal Woodward  has presented today for surgery, with the diagnosis of COLON SCREEN.  The various methods of treatment have been discussed with the patient and family. After consideration of risks, benefits and other options for treatment, the patient has consented to  Procedure(s): COLONOSCOPY WITH PROPOFOL (N/A) as a surgical intervention.  The patient's history has been reviewed, patient examined, no change in status, stable for surgery.  I have reviewed the patient's chart and labs.  Questions were answered to the patient's satisfaction.     [place], [place]

## 2019-09-11 ENCOUNTER — Encounter: Payer: Self-pay | Admitting: *Deleted

## 2019-09-11 NOTE — Anesthesia Postprocedure Evaluation (Signed)
Anesthesia Post Note  Patient: Aneri Slagel  Procedure(s) Performed: COLONOSCOPY WITH PROPOFOL (N/A )  Patient location during evaluation: PACU Anesthesia Type: General Level of consciousness: awake and alert Pain management: pain level controlled Vital Signs Assessment: post-procedure vital signs reviewed and stable Respiratory status: spontaneous breathing, nonlabored ventilation and respiratory function stable Cardiovascular status: blood pressure returned to baseline and stable Postop Assessment: no apparent nausea or vomiting Anesthetic complications: no     Last Vitals:  Vitals:   09/10/19 1031 09/10/19 1101  BP: (!) 105/53 100/62  Pulse:    Resp:    Temp: (!) 36.2 C   SpO2:      Last Pain:  Vitals:   09/11/19 0756  TempSrc:   PainSc: 0-No pain                 Karleen Hampshire

## 2020-01-23 ENCOUNTER — Other Ambulatory Visit: Payer: Disability Insurance

## 2020-01-23 DIAGNOSIS — Z20822 Contact with and (suspected) exposure to covid-19: Secondary | ICD-10-CM

## 2020-01-24 LAB — NOVEL CORONAVIRUS, NAA: SARS-CoV-2, NAA: NOT DETECTED

## 2020-01-24 LAB — SARS-COV-2, NAA 2 DAY TAT

## 2020-04-09 ENCOUNTER — Other Ambulatory Visit: Payer: Self-pay | Admitting: Family Medicine

## 2020-04-09 DIAGNOSIS — Z1231 Encounter for screening mammogram for malignant neoplasm of breast: Secondary | ICD-10-CM

## 2020-05-14 NOTE — Progress Notes (Signed)
   Complete physical exam  Patient: Krystal Woodward   DOB: 02/11/1997   65 y.o. Female  MRN: [id number]  Subjective:    No chief complaint on file.   Krystal Woodward is a 65 y.o. female who presents today for a complete physical exam. She reports consuming a {diet types:17450} diet. {types:19826} She generally feels {DESC; WELL/FAIRLY WELL/POORLY:18703}. She reports sleeping {DESC; WELL/FAIRLY WELL/POORLY:18703}. She {does/does not:200015} have additional problems to discuss today.    Most recent fall risk assessment:    10/19/2021   10:42 AM  Fall Risk   Falls in the past year? 0  Number falls in past yr: 0  Injury with Fall? 0  Risk for fall due to : No Fall Risks  Follow up Falls evaluation completed     Most recent depression screenings:    10/19/2021   10:42 AM 09/09/2020   10:46 AM  PHQ 2/9 Scores  PHQ - 2 Score 0 0  PHQ- 9 Score 5     {VISON DENTAL STD PSA (Optional):27386}  {History (Optional):23778}  Patient Care Team: Jessup, Joy, NP as PCP - General (Nurse Practitioner)   Outpatient Medications Prior to Visit  Medication Sig   fluticasone (FLONASE) 50 MCG/ACT nasal spray Place 2 sprays into both nostrils in the morning and at bedtime. After 7 days, reduce to once daily.   norgestimate-ethinyl estradiol (SPRINTEC 28) 0.25-35 MG-MCG tablet Take 1 tablet by mouth daily.   Nystatin POWD Apply liberally to affected area 2 times per day   spironolactone (ALDACTONE) 100 MG tablet Take 1 tablet (100 mg total) by mouth daily.   No facility-administered medications prior to visit.    ROS        Objective:     There were no vitals taken for this visit. {Vitals History (Optional):23777}  Physical Exam   No results found for any visits on 11/24/21. {Show previous labs (optional):23779}    Assessment & Plan:    Routine Health Maintenance and Physical Exam  Immunization History  Administered Date(s) Administered   DTaP 04/27/1999, 06/23/1999,  09/01/1999, 05/17/2000, 12/01/2003   Hepatitis A 09/27/2007, 10/02/2008   Hepatitis B 02/12/1999, 03/22/1999, 09/01/1999   HiB (PRP-OMP) 04/27/1999, 06/23/1999, 09/01/1999, 05/17/2000   IPV 04/27/1999, 06/23/1999, 02/20/2000, 12/01/2003   Influenza,inj,Quad PF,6+ Mos 01/02/2014   Influenza-Unspecified 04/03/2012   MMR 02/19/2001, 12/01/2003   Meningococcal Polysaccharide 10/02/2011   Pneumococcal Conjugate-13 05/17/2000   Pneumococcal-Unspecified 09/01/1999, 11/15/1999   Tdap 10/02/2011   Varicella 02/20/2000, 09/27/2007    Health Maintenance  Topic Date Due   HIV Screening  Never done   Hepatitis C Screening  Never done   INFLUENZA VACCINE  11/22/2021   PAP-Cervical Cytology Screening  11/24/2021 (Originally 02/11/2020)   PAP SMEAR-Modifier  11/24/2021 (Originally 02/11/2020)   TETANUS/TDAP  11/24/2021 (Originally 10/01/2021)   HPV VACCINES  Discontinued   COVID-19 Vaccine  Discontinued    Discussed health benefits of physical activity, and encouraged her to engage in regular exercise appropriate for her age and condition.  Problem List Items Addressed This Visit   None Visit Diagnoses     Annual physical exam    -  Primary   Cervical cancer screening       Need for Tdap vaccination          No follow-ups on file.     Joy Jessup, NP   

## 2020-05-17 ENCOUNTER — Ambulatory Visit (INDEPENDENT_AMBULATORY_CARE_PROVIDER_SITE_OTHER): Payer: No Typology Code available for payment source | Admitting: Physician Assistant

## 2020-05-17 ENCOUNTER — Ambulatory Visit: Payer: No Typology Code available for payment source | Admitting: Adult Health

## 2020-05-17 ENCOUNTER — Encounter: Payer: Self-pay | Admitting: Physician Assistant

## 2020-05-17 ENCOUNTER — Other Ambulatory Visit: Payer: Self-pay

## 2020-05-17 VITALS — BP 144/79 | HR 59 | Temp 98.5°F | Wt 178.0 lb

## 2020-05-17 DIAGNOSIS — S139XXA Sprain of joints and ligaments of unspecified parts of neck, initial encounter: Secondary | ICD-10-CM

## 2020-05-17 MED ORDER — CYCLOBENZAPRINE HCL 5 MG PO TABS: 5.0000 mg | ORAL_TABLET | Freq: Three times a day (TID) | ORAL | 0 refills | Status: DC | PRN

## 2020-05-17 NOTE — Patient Instructions (Signed)
Cervical Sprain A cervical sprain is a stretch or tear in one or more of the ligaments in the neck. Ligaments are the tissues that connect bones. Cervical sprains can range from mild to severe. Severe cervical sprains can cause the spinal bones (vertebrae) in the neck to be unstable. This can result in spinal cord damage and in serious nervous system problems. The time that it takes for a cervical sprain to heal depends on the cause and extent of the injury. Most cervical sprains heal in 4-6 weeks. What are the causes? Cervical sprains may be caused by trauma, such as an injury from a motor vehicle accident, a fall, or a sudden forward and backward whipping movement of the head and neck (whiplash injury). Mild cervical sprains may be caused by wear and tear over time. What increases the risk? The following factors may make you more likely to develop this condition:  Participating in activities that have a high risk of trauma to the neck. These include contact sports, auto racing, gymnastics, and diving.  Taking risks when driving or riding in a motor vehicle.  Osteoarthritis of the spine.  Poor strength and flexibility of the neck.  A previous neck injury.  Poor posture.  Spending long periods in certain positions that put stress on the neck, such as sitting at a computer for a long time. What are the signs or symptoms? Symptoms of this condition include:  Pain, soreness, stiffness, tenderness, swelling, or a burning sensation in the front, back, or sides of the neck, shoulders, or upper back.  Sudden tightening of neck muscles (spasms).  Limited ability to move the neck.  Headache.  Dizziness.  Nausea or vomiting.  Weakness, numbness, or tingling in a hand or an arm. Symptoms may develop right away after injury, or they may develop over a few days. In some cases, symptoms may go away with treatment and return (recur) over time. How is this diagnosed? This condition may be  diagnosed based on:  Your medical history.  Your symptoms.  Any recent injuries or known neck problems that you have, such as arthritis in the neck.  A physical exam.  Imaging tests, such as X-rays, MRI, and CT scan. How is this treated? This condition is treated by resting and icing the injured area and doing physical therapy exercises. Heat therapy may be used 2-3 days after the injury occurred if there is no swelling. Depending on the severity of your condition, treatment may also include:  Keeping your neck in place (immobilized) for periods of time. This may be done using: ? A cervical collar. This supports your chin and the back of your head. ? A cervical traction device. This is a sling that holds up your head. The device removes weight and pressure from your neck, and it may help to relieve pain.  Medicines that help to relieve pain and inflammation.  Medicines that help to relax your muscles (muscle relaxants).  Surgery. This is rare. Follow these instructions at home: Medicines  Take over-the-counter and prescription medicines only as told by your health care provider.  Ask your health care provider if the medicine prescribed to you: ? Requires you to avoid driving or using heavy machinery. ? Can cause constipation. You may need to take these actions to prevent or treat constipation:  Drink enough fluid to keep your urine pale yellow.  Take over-the-counter or prescription medicines.  Eat foods that are high in fiber, such as beans, whole grains, and fresh fruits   and vegetables.  Limit foods that are high in fat and processed sugars, such as fried or sweet foods.   If you have a cervical collar:  Wear the collar as told by your health care provider. Do not remove it unless told.  Ask before making any adjustments to your collar.  If you have long hair, keep it outside of the collar.  Ask your health care provider if you may remove the collar for cleaning and  bathing. If so: ? Follow instructions about how to remove it safely. ? Clean it by hand with mild soap and water and air-dry it completely. ? If your collar has removable pads, remove them every 1-2 days and wash them by hand with soap and water. Let them air-dry completely before putting them back in the collar.  Tell your health care provider if your skin under the collar has irritation or sores. Managing pain, stiffness, and swelling  If directed, use a cervical traction device as told.  If directed, put ice on the affected area. To do this: ? Put ice in a plastic bag. ? Place a towel between your skin and the bag. ? Leave the ice on for 20 minutes, 2-3 times a day.  If directed, apply heat to the affected area before you do your physical therapy or as often as told by your health care provider. Use the heat source that your health care provider recommends, such as a moist heat pack or a heating pad. ? Place a towel between your skin and the heat source. ? Leave the heat on for 20-30 minutes. ? Remove the heat if your skin turns bright red. This is especially important if you are unable to feel pain, heat, or cold. You may have a greater risk of getting burned.      Activity  Do not drive while wearing a cervical collar. If you do not have a cervical collar, ask if it is safe to drive while your neck heals.  Do not lift anything that is heavier than 10 lb (4.5 kg), or the limit that you are told, until your health care provider says that it is safe.  Rest as told by your health care provider.  If physical therapy was prescribed, do exercises as told by your health care provider or physical therapist.  Return to your normal activities as told by your health care provider. Avoid positions and activities that make your symptoms worse. Ask your health care provider what activities are safe for you. General instructions  Do not use any products that contain nicotine or tobacco, such  as cigarettes, e-cigarettes, and chewing tobacco. These can delay healing. If you need help quitting, ask your health care provider.  Keep all follow-up visits as told by your health care provider or physical therapist. This is important. How is this prevented? To prevent a cervical sprain from happening again:  Use and maintain good posture. Make any needed adjustments to your workstation to help you do this.  Exercise regularly as told by your health care provider or physical therapist.  Avoid risky activities that may cause a cervical sprain. Contact a health care provider if you have:  Symptoms that get worse or do not get better after 2 weeks of treatment.  Pain that gets worse or does not get better with medicine.  New, unexplained symptoms.  Sores or irritated skin on your neck from wearing your cervical collar. Get help right away if:  You have severe pain.    You develop numbness, tingling, or weakness in any part of your body.  You cannot move a part of your body (you have paralysis).  You have neck pain along with severe dizziness or headache. Summary  A cervical sprain is a stretch or tear in one or more of the ligaments in the neck.  Cervical sprains may be caused by trauma, such as an injury from a motor vehicle accident, a fall, or a sudden forward and backward whipping movement of the head and neck (whiplash injury).  Symptoms may develop right away after injury, or they may develop over a few days.  This condition may be treated with rest, ice, heat, medicines, physical therapy, and surgery. This information is not intended to replace advice given to you by your health care provider. Make sure you discuss any questions you have with your health care provider. Document Revised: 12/18/2018 Document Reviewed: 12/18/2018 Elsevier Patient Education  2021 [employer]   Cervical Strain and Sprain Rehab Ask your health care provider which exercises are safe for  you. Do exercises exactly as told by your health care provider and adjust them as directed. It is normal to feel mild stretching, pulling, tightness, or discomfort as you do these exercises. Stop right away if you feel sudden pain or your pain gets worse. Do not begin these exercises until told by your health care provider. Stretching and range-of-motion exercises Cervical side bending 1. Using good posture, sit on a stable chair or stand up. 2. Without moving your shoulders, slowly tilt your left / right ear to your shoulder until you feel a stretch in the opposite side neck muscles. You should be looking straight ahead. 3. Hold for __________ seconds. 4. Repeat with the other side of your neck. Repeat __________ times. Complete this exercise __________ times a day.   Cervical rotation 1. Using good posture, sit on a stable chair or stand up. 2. Slowly turn your head to the side as if you are looking over your left / right shoulder. ? Keep your eyes level with the ground. ? Stop when you feel a stretch along the side and the back of your neck. 3. Hold for __________ seconds. 4. Repeat this by turning to your other side. Repeat __________ times. Complete this exercise __________ times a day.   Thoracic extension and pectoral stretch 1. Roll a towel or a small blanket so it is about 4 inches (10 cm) in diameter. 2. Lie down on your back on a firm surface. 3. Put the towel lengthwise, under your spine in the middle of your back. It should not be under your shoulder blades. The towel should line up with your spine from your middle back to your lower back. 4. Put your hands behind your head and let your elbows fall out to your sides. 5. Hold for __________ seconds. Repeat __________ times. Complete this exercise __________ times a day. Strengthening exercises Isometric upper cervical flexion 1. Lie on your back with a thin pillow behind your head and a small rolled-up towel under your  neck. 2. Gently tuck your chin toward your chest and nod your head down to look toward your feet. Do not lift your head off the pillow. 3. Hold for __________ seconds. 4. Release the tension slowly. Relax your neck muscles completely before you repeat this exercise. Repeat __________ times. Complete this exercise __________ times a day. Isometric cervical extension 1. Stand about 6 inches (15 cm) away from a wall, with your back facing  the wall. 2. Place a soft object, about 6-8 inches (15-20 cm) in diameter, between the back of your head and the wall. A soft object could be a small pillow, a ball, or a folded towel. 3. Gently tilt your head back and press into the soft object. Keep your jaw and forehead relaxed. 4. Hold for __________ seconds. 5. Release the tension slowly. Relax your neck muscles completely before you repeat this exercise. Repeat __________ times. Complete this exercise __________ times a day.   Posture and body mechanics Body mechanics refers to the movements and positions of your body while you do your daily activities. Posture is part of body mechanics. Good posture and healthy body mechanics can help to relieve stress in your body's tissues and joints. Good posture means that your spine is in its natural S-curve position (your spine is neutral), your shoulders are pulled back slightly, and your head is not tipped forward. The following are general guidelines for applying improved posture and body mechanics to your everyday activities. Sitting 1. When sitting, keep your spine neutral and keep your feet flat on the floor. Use a footrest, if necessary, and keep your thighs parallel to the floor. Avoid rounding your shoulders, and avoid tilting your head forward. 2. When working at a desk or a computer, keep your desk at a height where your hands are slightly lower than your elbows. Slide your chair under your desk so you are close enough to maintain good posture. 3. When working  at a computer, place your monitor at a height where you are looking straight ahead and you do not have to tilt your head forward or downward to look at the screen.   Standing  When standing, keep your spine neutral and keep your feet about hip-width apart. Keep a slight bend in your knees. Your ears, shoulders, and hips should line up.  When you do a task in which you stand in one place for a long time, place one foot up on a stable object that is 2-4 inches (5-10 cm) high, such as a footstool. This helps keep your spine neutral.   Resting When lying down and resting, avoid positions that are most painful for you. Try to support your neck in a neutral position. You can use a contour pillow or a small rolled-up towel. Your pillow should support your neck but not push on it. This information is not intended to replace advice given to you by your health care provider. Make sure you discuss any questions you have with your health care provider. Document Revised: 07/31/2018 Document Reviewed: 01/09/2018 Elsevier Patient Education  2021 [employer].

## 2020-05-17 NOTE — Progress Notes (Unsigned)
Established patient visit   Patient: Krystal Woodward   DOB: 1955/12/16   65 y.o. Female  MRN: [id number] Visit Date: 05/17/2020  Today's healthcare provider: Margaretann Loveless, PA-C   Chief Complaint  Patient presents with  . Genia Hotter about three weeks ago at a trampoline park.    Subjective    Fall The accident occurred more than 1 week ago. The fall occurred while recreating/playing. Impact surface: Fell on the trampoline  There was no blood loss. The point of impact was the face. The pain is present in the neck. Pertinent negatives include no headaches.   HPI    Fall     Additional comments: Larey Seat about three weeks ago at a trampoline park.        Last edited by Paschal Dopp, CMA on 05/17/2020 11:01 AM. (History)      Patient reports that when she fell she hit her face on the trampoline and it bounced her head back. Since she has had neck pain prominent at the top of the shoulder line and radiates across back/upper shoulders bilaterally. No numbness or tingling in her arms or hands.   Patient Active Problem List   Diagnosis Date Noted  . Bilateral cataracts 01/20/2018  . Post-menopausal 01/14/2016  . Pre-diabetes 01/06/2015  . Bilateral knee pain 01/04/2015  . Overweight 01/04/2015  . Hyperprolactinemia (HCC) 01/04/2015  . Fatigue 01/04/2015  . Family history of diabetes mellitus 01/04/2015  . Family history of colon cancer 01/04/2015  . MURMUR 06/30/2010   Past Medical History:  Diagnosis Date  . 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  . Breast mass 01/24/2011  . Chest pain   . Elevated liver enzymes   . GERD (gastroesophageal reflux disease)    h/o  . Hyperprolactinemia (HCC) [id number]   Normal MRI  . Vitamin D deficiency    Social History   Tobacco Use  . Smoking status: Never Smoker  . Smokeless tobacco: Never Used  Vaping Use  . Vaping Use: Never used  Substance Use Topics  . Alcohol use: No  . Drug use: No   No Known Allergies    Medications: Outpatient Medications Prior to Visit  Medication Sig  . acetaminophen (TYLENOL) 500 MG tablet Take 1,000 mg by mouth every 8 (eight) hours as needed (pain).  Marland Kitchen aspirin 81 MG tablet Take 81 mg by mouth daily.  . Cholecalciferol 125 MCG (5000 UT) capsule Take 10,000 Units by mouth daily.  Marland Kitchen ibuprofen (ADVIL) 200 MG tablet Take 600-800 mg by mouth every 8 (eight) hours as needed (pain).  . Multiple Vitamins-Minerals (CENTRUM SILVER ULTRA WOMENS PO) Take 1 capsule by mouth daily.  . naproxen (NAPROSYN) 500 MG tablet Take 1 tablet (500 mg total) by mouth 2 (two) times daily with a meal.  . Omega-3 Fatty Acids (FISH OIL PO) Take 1,500 Units by mouth daily.  . TURMERIC CURCUMIN PO Take 1 tablet by mouth daily.  . cyclobenzaprine (FLEXERIL) 5 MG tablet Take 1 tablet (5 mg total) by mouth 3 (three) times daily as needed for muscle spasms. (Patient not taking: Reported on 05/17/2020)   No facility-administered medications prior to visit.    Review of Systems  Constitutional: Negative.   Musculoskeletal: Positive for arthralgias, back pain, neck pain and neck stiffness. Negative for gait problem, joint swelling and myalgias.  Neurological: Negative for dizziness, seizures, weakness, light-headedness and headaches.       Objective  There were no vitals taken for this visit.    Physical Exam Vitals reviewed.  Constitutional:      General: She is not in acute distress.    Appearance: Normal appearance. She is well-developed and well-nourished. She is obese. She is not ill-appearing.  HENT:     Head: Normocephalic and atraumatic.  Eyes:     Extraocular Movements: EOM normal.  Pulmonary:     Effort: Pulmonary effort is normal. No respiratory distress.  Musculoskeletal:     Cervical back: Neck supple. Tenderness present. Pain with movement and muscular tenderness present. No spinous process tenderness. Decreased range of motion.  Neurological:     Mental Status: She is  alert.  Psychiatric:        Mood and Affect: Mood and affect normal.        Behavior: Behavior normal.        Thought Content: Thought content normal.        Judgment: Judgment normal.       No results found for any visits on 05/17/20.  Assessment & Plan     1. Cervical sprain, initial encounter Suspect cervical sprain/whiplash type injury. Will give flexeril as below for muscle tension and spasm. Ibuprofen and tylenol can be continued for pain/inflammation. Heating pad would be useful. Neck stretches and exercises were printed for patient. If symptoms do not improve or worsen call the office. Imaging and possible PT may be beneficial if not improving.  - cyclobenzaprine (FLEXERIL) 5 MG tablet; Take 1 tablet (5 mg total) by mouth 3 (three) times daily as needed for muscle spasms.  Dispense: 30 tablet; Refill: 0   No follow-ups on file.      Delmer Islam, PA-C, have reviewed all documentation for this visit. The documentation on 05/20/20 for the exam, diagnosis, procedures, and orders are all accurate and complete.    Reine Just  [facility] [phone] (phone) [phone] (fax)  [facility]

## 2020-05-20 ENCOUNTER — Ambulatory Visit
Admission: RE | Admit: 2020-05-20 | Discharge: 2020-05-20 | Disposition: A | Discharge status: Home / Self Care | Payer: No Typology Code available for payment source | Source: Ambulatory Visit | Attending: Family Medicine | Admitting: Family Medicine

## 2020-05-20 ENCOUNTER — Other Ambulatory Visit: Payer: Self-pay

## 2020-05-20 ENCOUNTER — Encounter: Payer: Self-pay | Admitting: Physician Assistant

## 2020-05-20 DIAGNOSIS — Z1231 Encounter for screening mammogram for malignant neoplasm of breast: Secondary | ICD-10-CM

## 2020-06-07 ENCOUNTER — Other Ambulatory Visit: Payer: Self-pay | Admitting: Family Medicine

## 2020-06-07 DIAGNOSIS — S139XXA Sprain of joints and ligaments of unspecified parts of neck, initial encounter: Secondary | ICD-10-CM

## 2020-06-07 NOTE — Telephone Encounter (Signed)
Requested medication (s) are due for refill today: Due 06/17/20  Requested medication (s) are on the active medication list: yes  Last refill: 05/17/20  #30  0 refills  Future visit scheduled   Yes 06/09/20  Notes to clinic:  Not delegated  Requested Prescriptions  Pending Prescriptions Disp Refills   cyclobenzaprine (FLEXERIL) 5 MG tablet 30 tablet 0    Sig: Take 1 tablet (5 mg total) by mouth 3 (three) times daily as needed for muscle spasms.      Not Delegated - Analgesics:  Muscle Relaxants Failed - 06/07/2020  2:23 PM      Failed - This refill cannot be delegated      Passed - Valid encounter within last 6 months    Recent Outpatient Visits           3 weeks ago Cervical sprain, initial encounter   [facility] [place], Alessandra Bevels, PA-C   3 weeks ago No-show for appointment   [facility] Fl[place], Eula Fried, FNP   11 months ago Possible urinary tract infection   [facility] [place], [place], [place]   1 year ago Acute midline low back pain without sciatica   [facility] Malva Limes, MD   1 year ago Annual physical exam   [facility] Trey Sailors, [place]       Future Appointments             In 2 days Sherrie Mustache, Demetrios Isaacs, MD [facility]

## 2020-06-07 NOTE — Telephone Encounter (Signed)
Medication Refill - Medication: cyclobenzaprine (FLEXERIL) 5 MG tablet     Preferred Pharmacy (with phone number or street name):  [facility] [address] [place], [place] - [address] Phone:  [phone]  Fax:  [phone]       Agent: Please be advised that RX refills may take up to 3 business days. We ask that you follow-up with your pharmacy.

## 2020-06-08 ENCOUNTER — Other Ambulatory Visit: Payer: Self-pay | Admitting: Physician Assistant

## 2020-06-08 DIAGNOSIS — S139XXA Sprain of joints and ligaments of unspecified parts of neck, initial encounter: Secondary | ICD-10-CM

## 2020-06-09 ENCOUNTER — Encounter: Payer: No Typology Code available for payment source | Admitting: Family Medicine

## 2020-06-09 NOTE — Progress Notes (Deleted)
Complete physical exam   Patient: Krystal Woodward   DOB: [birth year]   65 y.o. Female  MRN: [id number] Visit Date: 06/09/2020  Today's healthcare provider: [name], MD   No chief complaint on file.  Subjective    Krystal Woodward is a 65 y.o. female who presents today for a complete physical exam.  She reports consuming a {diet types:17450} diet. {Exercise:19826} She generally feels {well/fairly well/poorly:18703}. She reports sleeping {well/fairly well/poorly:18703}. She {does/does not:200015} have additional problems to discuss today.  HPI  Prediabetes, Follow-up  Lab Results  Component Value Date   HGBA1C 5.8 (A) 01/21/2019   HGBA1C 5.8 (H) 01/18/2018   HGBA1C 5.7 (H) 01/16/2017   GLUCOSE 88 01/21/2019   GLUCOSE 101 (H) 10/07/2018   GLUCOSE 80 01/18/2018    Last seen for for this more than 1 year ago. Management since that visit includes continuing healthy diet and exercise. Current symptoms include {Symptoms; diabetes:14075} and have been {Desc; course:15616}.  Prior visit with dietician: {yes/no:17258} Current diet: {diet habits:16563} Current exercise: {exercise types:16438}  Pertinent Labs:    Component Value Date/Time   CHOL 135 01/21/2019 1059   TRIG 96 01/21/2019 1059   CHOLHDL 3.1 01/21/2019 1059   CREATININE 1.05 (H) 01/21/2019 1059    Wt Readings from Last 3 Encounters:  05/17/20 178 lb (80.7 kg)  09/10/19 175 lb (79.4 kg)  06/16/19 182 lb 6.4 oz (82.7 kg)    -----------------------------------------------------------------------------------------  Past Medical History:  Diagnosis Date  . 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  . Breast mass 01/24/2011  . Chest pain   . Elevated liver enzymes   . GERD (gastroesophageal reflux disease)    h/o  . Hyperprolactinemia (HCC) [id number]   Normal MRI  . Vitamin D deficiency    Past Surgical History:  Procedure Laterality Date  . BREAST BIOPSY Right 06/17/15   Inflammed Fibrocyst  .  BREAST CYST EXCISION  02/27/2011   Procedure: CYST EXCISION BREAST;  Surgeon: Currie Paris, MD;  Location: [facility];  Service: General;  Laterality: Left;  Needle localization removal left breast mass  . BREAST EXCISIONAL BIOPSY Left   . BUNIONECTOMY     both feet  . COLONOSCOPY WITH PROPOFOL N/A 09/10/2019   Procedure: COLONOSCOPY WITH PROPOFOL;  Surgeon: Toledo, Boykin Nearing, MD;  Location: [facility];  Service: Gastroenterology;  Laterality: N/A;  . INCISION AND DRAINAGE ABSCESS Right 07/28/2015   Procedure: INCISION AND DRAINAGE RIGHT BREAST  ABSCESS;  Surgeon: Manus Rudd, MD;  Location: [facility];  Service: General;  Laterality: Right;  . SHOULDER ARTHROSCOPY WITH ROTATOR CUFF REPAIR AND OPEN BICEPS TENODESIS  10/10/2018   Procedure: SHOULDER ARTHROSCOPY WITH OPEN ROTATOR CUFF REPAIR AND MINI OPEN BICEPS TENODESIS;  Surgeon: Christena Flake, MD;  Location: [facility];  Service: Orthopedics;;  . SHOULDER ARTHROSCOPY WITH SUBACROMIAL DECOMPRESSION  10/10/2018   Procedure: SHOULDER ARTHROSCOPY WITH SUBACROMIAL DECOMPRESSION;  Surgeon: Christena Flake, MD;  Location: [facility];  Service: Orthopedics;;  . TUBAL LIGATION  1996   Social History   Socioeconomic History  . Marital status: Married    Spouse name: Not on file  . Number of children: 4  . Years of education: Not on file  . Highest education level: Not on file  Occupational History  . Occupation: CNA     Comment: [employer]  Tobacco Use  . Smoking status: Never Smoker  . Smokeless tobacco: Never Used  Vaping Use  .  Vaping Use: Never used  Substance and Sexual Activity  . Alcohol use: No  . Drug use: No  . Sexual activity: Not on file  Other Topics Concern  . Not on file  Social History Narrative  . Not on file   Social Determinants of Health   Financial Resource Strain: Not on file  Food Insecurity: Not on file  Transportation Needs: Not on file  Physical Activity: Not on file   Stress: Not on file  Social Connections: Not on file  Intimate Partner Violence: Not on file   Family Status  Relation Name Status  . Mother  Deceased at age 7  . Father  Deceased at age 26's  . Sister  Deceased  . PGM  (Not Specified)   Family History  Problem Relation Age of Onset  . Cancer Mother        lymph nodes  . Transient ischemic attack Mother   . Diabetes Father   . Cancer Sister 43       2015 Rectal cancer, spread to Lymph node, lung cancer, non-smoker  . Asthma Paternal Grandmother   . Diabetes Paternal Grandmother    No Known Allergies  Patient Care Team: Malva Limes, MD as PCP - General (Family Medicine) Poggi, Excell Seltzer, MD as Consulting Physician (Surgery)   Medications: Outpatient Medications Prior to Visit  Medication Sig  . acetaminophen (TYLENOL) 500 MG tablet Take 1,000 mg by mouth every 8 (eight) hours as needed (pain).  Marland Kitchen aspirin 81 MG tablet Take 81 mg by mouth daily.  . Cholecalciferol 125 MCG (5000 UT) capsule Take 10,000 Units by mouth daily.  . cyclobenzaprine (FLEXERIL) 5 MG tablet Take 1 tablet by mouth three times daily as needed for muscle spasm  . ibuprofen (ADVIL) 200 MG tablet Take 600-800 mg by mouth every 8 (eight) hours as needed (pain).  . Multiple Vitamins-Minerals (CENTRUM SILVER ULTRA WOMENS PO) Take 1 capsule by mouth daily.  . naproxen (NAPROSYN) 500 MG tablet Take 1 tablet (500 mg total) by mouth 2 (two) times daily with a meal.  . Omega-3 Fatty Acids (FISH OIL PO) Take 1,500 Units by mouth daily.  . TURMERIC CURCUMIN PO Take 1 tablet by mouth daily.   No facility-administered medications prior to visit.    Review of Systems  Constitutional: Negative for chills, fatigue and fever.  HENT: Negative for congestion, ear pain, rhinorrhea, sneezing and sore throat.   Eyes: Negative.  Negative for pain and redness.  Respiratory: Negative for cough, shortness of breath and wheezing.   Cardiovascular: Negative for chest pain  and leg swelling.  Gastrointestinal: Negative for abdominal pain, blood in stool, constipation, diarrhea and nausea.  Endocrine: Negative for polydipsia and polyphagia.  Genitourinary: Negative.  Negative for dysuria, flank pain, hematuria, pelvic pain, vaginal bleeding and vaginal discharge.  Musculoskeletal: Negative for arthralgias, back pain, gait problem and joint swelling.  Skin: Negative for rash.  Neurological: Negative.  Negative for dizziness, tremors, seizures, weakness, light-headedness, numbness and headaches.  Hematological: Negative for adenopathy.  Psychiatric/Behavioral: Negative.  Negative for behavioral problems, confusion and dysphoric mood. The patient is not nervous/anxious and is not hyperactive.     {Labs  Heme  Chem  Endocrine  Serology  Results Review (optional):23779::" "}  Objective    There were no vitals taken for this visit. {Show previous vital signs (optional):23777::" "}  Physical Exam  ***  Last depression screening scores PHQ 2/9 Scores 05/17/2020 01/21/2019 01/18/2018  PHQ - 2 Score 0  0 0  PHQ- 9 Score 0 - 0   Last fall risk screening Fall Risk  05/17/2020  Falls in the past year? 0  Number falls in past yr: 0  Injury with Fall? 0  Risk for fall due to : No Fall Risks  Follow up Falls evaluation completed   Last Audit-C alcohol use screening Alcohol Use Disorder Test (AUDIT) 05/17/2020  1. How often do you have a drink containing alcohol? 0  2. How many drinks containing alcohol do you have on a typical day when you are drinking? 0  3. How often do you have six or more drinks on one occasion? 0  AUDIT-C Score 0  Alcohol Brief Interventions/Follow-up AUDIT Score <7 follow-up not indicated   A score of 3 or more in women, and 4 or more in men indicates increased risk for alcohol abuse, EXCEPT if all of the points are from question 1   No results found for any visits on 06/09/20.  Assessment & Plan    Routine Health Maintenance and  Physical Exam  Exercise Activities and Dietary recommendations Goals   None     Immunization History  Administered Date(s) Administered  . Influenza,inj,Quad PF,6+ Mos 01/16/2017, 01/18/2018, 12/31/2018  . PFIZER(Purple Top)SARS-COV-2 Vaccination 07/28/2019, 08/19/2019  . Tdap 01/28/2010    Health Maintenance  Topic Date Due  . INFLUENZA VACCINE  11/23/2019  . TETANUS/TDAP  01/29/2020  . COVID-19 Vaccine (3 - Booster for Pfizer series) 02/18/2020  . PAP SMEAR-Modifier  01/20/2022  . MAMMOGRAM  05/20/2022  . COLONOSCOPY (Pts 45-16yrs Insurance coverage will need to be confirmed)  09/09/2024  . Hepatitis C Screening  Completed  . HIV Screening  Completed    Discussed health benefits of physical activity, and encouraged her to engage in regular exercise appropriate for her age and condition.  ***  No follow-ups on file.     {provider attestation***:1}   [name], MD  [facility] [phone] (phone) [phone] (fax)  [facility]

## 2020-06-09 NOTE — Telephone Encounter (Signed)
Requested medication (s) are due for refill today: yes  Requested medication (s) are on the active medication list: yes  Last refill: 05/17/20  Future visit scheduled: yes  Notes to clinic:  not delegated    Requested Prescriptions  Pending Prescriptions Disp Refills   cyclobenzaprine (FLEXERIL) 5 MG tablet [Pharmacy Med Name: Cyclobenzaprine HCl 5 MG Oral Tablet] 30 tablet 0    Sig: Take 1 tablet by mouth three times daily as needed for muscle spasm      Not Delegated - Analgesics:  Muscle Relaxants Failed - 06/08/2020  7:05 PM      Failed - This refill cannot be delegated      Passed - Valid encounter within last 6 months    Recent Outpatient Visits           3 weeks ago Cervical sprain, initial encounter   [facility] [place], Alessandra Bevels, PA-C   3 weeks ago No-show for appointment   [facility] [place], Eula Fried, FNP   11 months ago Possible urinary tract infection   [facility], [place], [place]   1 year ago Acute midline low back pain without sciatica   [facility] Malva Limes, MD   1 year ago Annual physical exam   [facility] Trey Sailors, [place]       Future Appointments             Today Sherrie Mustache, Demetrios Isaacs, MD [facility], [facility]

## 2020-06-28 ENCOUNTER — Telehealth: Payer: Self-pay

## 2020-06-28 NOTE — Telephone Encounter (Signed)
[facility] faxed over forms to be completed addressed to [place]. Forms placed in Jenni's box. Please advise. Thanks TNP

## 2020-07-01 ENCOUNTER — Ambulatory Visit
Admission: RE | Admit: 2020-07-01 | Discharge: 2020-07-01 | Disposition: A | Discharge status: Home / Self Care | Payer: No Typology Code available for payment source | Source: Ambulatory Visit | Attending: Physician Assistant | Admitting: Physician Assistant

## 2020-07-01 ENCOUNTER — Ambulatory Visit (INDEPENDENT_AMBULATORY_CARE_PROVIDER_SITE_OTHER): Payer: No Typology Code available for payment source | Admitting: Physician Assistant

## 2020-07-01 ENCOUNTER — Other Ambulatory Visit: Payer: Self-pay

## 2020-07-01 VITALS — BP 132/74 | HR 66 | Temp 98.7°F | Ht 62.0 in | Wt 172.8 lb

## 2020-07-01 DIAGNOSIS — M25561 Pain in right knee: Secondary | ICD-10-CM | POA: Insufficient documentation

## 2020-07-01 DIAGNOSIS — M25562 Pain in left knee: Secondary | ICD-10-CM | POA: Diagnosis present

## 2020-07-01 DIAGNOSIS — G8929 Other chronic pain: Secondary | ICD-10-CM

## 2020-07-01 DIAGNOSIS — M545 Low back pain, unspecified: Secondary | ICD-10-CM | POA: Insufficient documentation

## 2020-07-01 DIAGNOSIS — N898 Other specified noninflammatory disorders of vagina: Secondary | ICD-10-CM

## 2020-07-01 MED ORDER — DICLOFENAC SODIUM 1 % EX GEL: 2.0000 g | Freq: Four times a day (QID) | CUTANEOUS | 0 refills | Status: DC

## 2020-07-01 NOTE — Progress Notes (Signed)
Established patient visit   Patient: Krystal Woodward   DOB: 07/24/1955   65 y.o. Female  MRN: [id number] Visit Date: 07/01/2020  Today's healthcare provider: Trey Sailors, PA-C   Chief Complaint  Patient presents with  . Back Pain   Subjective    HPI  Left side pain. X 1 month.  Pt reports left lower back pain that wraps around to her medial left groin. Pt says pain has been there for 1-2 months and denies injury to area. Pt descries the pain as sore. Pt says the frequency of the pain is intermittent. Pt says the pain is not radiating. Pt says it helps when she puts pressure on the area with her hand but the area is aggravated by nothing but rather just comes on. Pt has tried tylenol and advil, muscle relaxer but says it helped her sleep.   Patient also reports that she is having bilateral knee pain that gets worse when she goes up and down the stairs. She also feels like she is giving out. She has had this evaluated by Dr. Ernest Pine.   Previous labs Lab Results  Component Value Date   WBC 7.6 01/21/2019   HGB 12.2 01/21/2019   HCT 37.6 01/21/2019   MCV 96 01/21/2019   MCH 31.0 01/21/2019   RDW 12.3 01/21/2019   PLT 265 01/21/2019   Lab Results  Component Value Date   GLUCOSE 88 01/21/2019   NA 142 01/21/2019   K 4.1 01/21/2019   CL 106 01/21/2019   CO2 22 01/21/2019   BUN 15 01/21/2019   CREATININE 1.05 (H) 01/21/2019   GFRNONAA 57 (L) 01/21/2019   GFRAA 66 01/21/2019   CALCIUM 9.8 01/21/2019   PROT 6.8 01/21/2019   ALBUMIN 4.2 01/21/2019   LABGLOB 2.6 01/21/2019   AGRATIO 1.6 01/21/2019   BILITOT <0.2 01/21/2019   ALKPHOS 152 (H) 01/21/2019   AST 16 01/21/2019   ALT 13 01/21/2019   ANIONGAP 9 10/07/2018   No results found for: AMYLASE -----------------------------------------------------------------------------------------  She reports some vaginal odor.      Medications: Outpatient Medications Prior to Visit  Medication Sig  . acetaminophen  (TYLENOL) 500 MG tablet Take 1,000 mg by mouth every 8 (eight) hours as needed (pain).  Marland Kitchen aspirin 81 MG tablet Take 81 mg by mouth daily.  . Cholecalciferol 125 MCG (5000 UT) capsule Take 10,000 Units by mouth daily.  . cyclobenzaprine (FLEXERIL) 5 MG tablet Take 1 tablet by mouth three times daily as needed for muscle spasm  . ibuprofen (ADVIL) 200 MG tablet Take 600-800 mg by mouth every 8 (eight) hours as needed (pain).  . Multiple Vitamins-Minerals (CENTRUM SILVER ULTRA WOMENS PO) Take 1 capsule by mouth daily.  . Omega-3 Fatty Acids (FISH OIL PO) Take 1,500 Units by mouth daily.  . TURMERIC CURCUMIN PO Take 1 tablet by mouth daily.  . [DISCONTINUED] naproxen (NAPROSYN) 500 MG tablet Take 1 tablet (500 mg total) by mouth 2 (two) times daily with a meal.   No facility-administered medications prior to visit.    Review of Systems  Genitourinary: Negative for difficulty urinating, dysuria, vaginal bleeding, vaginal discharge and vaginal pain.       Pt has noticed strange odor coming from vagina.  Musculoskeletal: Positive for arthralgias and back pain.       Objective    BP 132/74 (BP Location: Left Arm, Patient Position: Sitting, Cuff Size: Normal)   Pulse 66   Temp 98.7 F (37.1  C) (Oral)   Ht 5\' 2"  (1.575 m)   Wt 172 lb 12.8 oz (78.4 kg)   SpO2 100%   BMI 31.61 kg/m     Physical Exam Constitutional:      Appearance: Normal appearance.  Cardiovascular:     Rate and Rhythm: Normal rate.  Pulmonary:     Effort: Pulmonary effort is normal.  Abdominal:     General: Bowel sounds are normal.     Palpations: Abdomen is soft.  Musculoskeletal:     Right hip: Normal.     Left hip: Normal.     Right knee: Crepitus present.     Left knee: Crepitus present.  Neurological:     Mental Status: She is alert and oriented to person, place, and time. Mental status is at baseline.  Psychiatric:        Mood and Affect: Mood normal.        Behavior: Behavior normal.       No  results found for any visits on 07/01/20.  Assessment & Plan    1. Chronic left-sided low back pain without sciatica  Xrays are clear of lumbar spine arthritis, however, significant stool burden and suspect constipation is contributing to this. Recommend capful miralax daily.   - diclofenac Sodium (VOLTAREN) 1 % GEL; Apply 2 g topically 4 (four) times daily.  Dispense: 100 g; Refill: 0 - DG Knee Complete 4 Views Left; Future - DG Knee Complete 4 Views Right; Future - DG Lumbar Spine Complete; Future - DG Hip Unilat W OR W/O Pelvis 2-3 Views Left; Future - DG Hip Unilat W OR W/O Pelvis 2-3 Views Right; Future - Ambulatory referral to Orthopedics  2. Vaginal odor  - Cervicovaginal ancillary only  3. Chronic pain of both knees  Knees with bilateral arthritis.   - DG Knee Complete 4 Views Left; Future - DG Knee Complete 4 Views Right; Future - Ambulatory referral to Orthopedics   No follow-ups on file.      I05/10/22, PA-C, have reviewed all documentation for this visit. The documentation on 07/13/20 for the exam, diagnosis, procedures, and orders are all accurate and complete.  The entirety of the information documented in the History of Present Illness, Review of Systems and Physical Exam were personally obtained by me. Portions of this information were initially documented by [facility] and reviewed by me for thoroughness and accuracy.     [facility]  [facility] [phone] (phone) [phone] (fax)  [facility]

## 2020-07-06 NOTE — Telephone Encounter (Signed)
I have only ever seen the patient once during an acute visit for a neck sprain.   Dr. Sherrie Mustache is listed as her PCP, but he has only seen her twice for physicals.   They are asking for records from 08/2019.  I know she has history of rotator cuff tears requiring surgical repair. I have no clue what she is disabled from and what they are looking for.   For me to complete these forms I would need to see her to discuss. It may be best her orthopedic office fill out disability forms if it is from her orthopedic stuff.

## 2020-07-08 ENCOUNTER — Other Ambulatory Visit: Payer: Self-pay | Admitting: Physician Assistant

## 2020-07-08 NOTE — Telephone Encounter (Signed)
Unable to have call completed.  Need to try again

## 2020-07-09 ENCOUNTER — Telehealth: Payer: Self-pay

## 2020-07-09 NOTE — Telephone Encounter (Signed)
Copied from CRM [id number]. Topic: General - Other >> Jul 09, 2020 12:19 PM Gwenlyn Fudge wrote: Reason for CRM: Darreld Mclean, from [facility], calling and is requesting to have a nurse or medical assistant give her a call back regarding the test code for the test that was ordered for pt. Please advise.

## 2020-07-12 LAB — SPECIMEN STATUS REPORT

## 2020-07-12 NOTE — Telephone Encounter (Signed)
Spoke with lab tech wrong requistion was sent to [facility], what was printed was cone requistion for Aptima swab. Clarified with lab tech to put in as a NuSwab Vaginitis Plus. KW

## 2020-07-13 ENCOUNTER — Telehealth: Payer: Self-pay

## 2020-07-13 NOTE — Telephone Encounter (Signed)
I called pt and I informed pt that her lab results were not in yet as a result of [facility] still processing them.

## 2020-07-13 NOTE — Telephone Encounter (Signed)
Copied from CRM [id number]. Topic: Quick Communication - Lab Results (Clinic Use ONLY) >> Jul 13, 2020  3:14 PM Leafy Ro wrote: Pt is calling and would like her urine results

## 2020-07-16 LAB — NUSWAB VAGINITIS PLUS (VG+)
Candida albicans, NAA: NEGATIVE
Candida glabrata, NAA: NEGATIVE
Chlamydia trachomatis, NAA: NEGATIVE
Neisseria gonorrhoeae, NAA: NEGATIVE
Trich vag by NAA: NEGATIVE

## 2020-07-16 LAB — SPECIMEN STATUS REPORT

## 2020-07-26 ENCOUNTER — Other Ambulatory Visit: Payer: Self-pay

## 2020-07-26 ENCOUNTER — Ambulatory Visit (INDEPENDENT_AMBULATORY_CARE_PROVIDER_SITE_OTHER): Payer: No Typology Code available for payment source | Admitting: Family Medicine

## 2020-07-26 VITALS — BP 131/68 | HR 51 | Ht 62.0 in | Wt 177.0 lb

## 2020-07-26 DIAGNOSIS — F988 Other specified behavioral and emotional disorders with onset usually occurring in childhood and adolescence: Secondary | ICD-10-CM | POA: Diagnosis not present

## 2020-07-26 DIAGNOSIS — R1032 Left lower quadrant pain: Secondary | ICD-10-CM

## 2020-07-26 MED ORDER — ATOMOXETINE HCL 40 MG PO CAPS: 40.0000 mg | ORAL_CAPSULE | Freq: Every day | ORAL | 1 refills | Status: DC

## 2020-07-26 NOTE — Progress Notes (Signed)
Established patient visit   Patient: Krystal Woodward   DOB: 1956/04/04   65 y.o. Female  MRN: [id number] Visit Date: 07/26/2020  Today's healthcare provider: [name], MD   Chief Complaint  Patient presents with  . ADHD  . Abdominal Pain   Subjective    HPI   Patient has left sided low back pain and saw Adrianna 1 mo ago and had xray of LS spine showing mild DDD, but severe stool burden which was thought to contribute to pain. Patient tried miralax followed by several other OTC treatments before pain resolved. Pain returned yesterday which she states is in left lower quadrant. Ttook 3 advil and tylenol last night, pain has resolved today. She did have unremarkable colonoscopy last year.   Patient presents for concerns regarding ADHD. She states that all of her life she has had trouble staying focused on the task at hand and completely tasks. She thought it would get better with age but it hasn't. She gets easily distracted from normal everyday tasks and projects which is causing stress in her home environment. She states her son with ADD has exactly the same difficulties as she does.  e      Medications: Outpatient Medications Prior to Visit  Medication Sig  . acetaminophen (TYLENOL) 500 MG tablet Take 1,000 mg by mouth every 8 (eight) hours as needed (pain).  Marland Kitchen aspirin 81 MG tablet Take 81 mg by mouth daily.  . Cholecalciferol 125 MCG (5000 UT) capsule Take 10,000 Units by mouth daily.  . cyclobenzaprine (FLEXERIL) 5 MG tablet Take 1 tablet by mouth three times daily as needed for muscle spasm  . diclofenac Sodium (VOLTAREN) 1 % GEL Apply 2 g topically 4 (four) times daily.  Marland Kitchen ibuprofen (ADVIL) 200 MG tablet Take 600-800 mg by mouth every 8 (eight) hours as needed (pain).  . Multiple Vitamins-Minerals (CENTRUM SILVER ULTRA WOMENS PO) Take 1 capsule by mouth daily.  . Omega-3 Fatty Acids (FISH OIL PO) Take 1,500 Units by mouth daily.  . TURMERIC CURCUMIN PO Take 1 tablet  by mouth daily.   No facility-administered medications prior to visit.        Objective    BP 131/68 (BP Location: Right Arm, Patient Position: Sitting, Cuff Size: Normal)   Pulse (!) 51   Ht 5\' 2"  (1.575 m)   Wt 177 lb (80.3 kg)   SpO2 100%   BMI 32.37 kg/m     Physical Exam   General appearance: Mildly obese female, cooperative and in no acute distress Head: Normocephalic, without obvious abnormality, atraumatic Respiratory: Respirations even and unlabored, normal respiratory rate Extremities: All extremities are intact.  Skin: Skin color, texture, turgor normal. No rashes seen  Psych: Appropriate mood and affect. Neurologic: Mental status: Alert, oriented to person, place, and time, thought content appropriate.    Assessment & Plan     1. Attention deficit disorder (ADD) without hyperactivity Life long symptoms, never been on medications, but she would like to try meds now.  Start- atomoxetine (STRATTERA) 40 MG capsule; Take 1 capsule (40 mg total) by mouth daily x 2 weeks, then increase to 2 capsules daily.  Follow up 4 weeks.   2. LLQ pain Associated with infrequent BM and severe stool burden on xray. Start program of daily fiber supplements and prn stool softeners.   No follow-ups on file.         , MD  [facility] [phone] (phone) [phone] (fax)  [facility]  

## 2020-07-26 NOTE — Patient Instructions (Addendum)
   Start Strattera by taking one tablet daily for 2 weeks, then increase to 2 tablets daily.   Take 1 scoop of Metamucil powder mixed with a full glass of water every day

## 2020-07-29 ENCOUNTER — Telehealth: Payer: Self-pay | Admitting: Family Medicine

## 2020-07-29 ENCOUNTER — Telehealth: Payer: Self-pay

## 2020-07-29 NOTE — Telephone Encounter (Signed)
Patient called to change her referral request to be sent to [facility] orthopedics with Dr. Despina Hick.  Please call patient to let her know that it has been changed.  CB# [phone]

## 2020-07-29 NOTE — Telephone Encounter (Signed)
[facility] sent physician statement to be completed. Form placed in Dr. Theodis Aguas box and copy on file with medical records. TNP

## 2020-07-30 NOTE — Telephone Encounter (Signed)
Called patient and she reports that the form was for her knee pain from when she saw Adriana on 07/01/2020. She reports that she was advised of arthritis in bilateral knees and back and that was most likely the cause of her pain. Please advise.

## 2020-07-30 NOTE — Telephone Encounter (Signed)
I have no idea what her disability claim is for. If it's related to her shoulder surgery then she needs to follow up with orthopedist for disability evaluation.

## 2020-08-04 ENCOUNTER — Other Ambulatory Visit: Payer: Self-pay

## 2020-08-04 ENCOUNTER — Other Ambulatory Visit (HOSPITAL_COMMUNITY)
Admission: RE | Admit: 2020-08-04 | Discharge: 2020-08-04 | Disposition: A | Discharge status: Home / Self Care | Payer: No Typology Code available for payment source | Source: Ambulatory Visit | Attending: Family Medicine | Admitting: Family Medicine

## 2020-08-04 ENCOUNTER — Ambulatory Visit (INDEPENDENT_AMBULATORY_CARE_PROVIDER_SITE_OTHER): Payer: No Typology Code available for payment source | Admitting: Family Medicine

## 2020-08-04 ENCOUNTER — Encounter: Payer: Self-pay | Admitting: Family Medicine

## 2020-08-04 VITALS — BP 112/71 | HR 74 | Temp 97.5°F | Resp 16 | Ht 62.0 in | Wt 171.2 lb

## 2020-08-04 DIAGNOSIS — R7303 Prediabetes: Secondary | ICD-10-CM

## 2020-08-04 DIAGNOSIS — Z124 Encounter for screening for malignant neoplasm of cervix: Secondary | ICD-10-CM | POA: Diagnosis present

## 2020-08-04 DIAGNOSIS — G8929 Other chronic pain: Secondary | ICD-10-CM

## 2020-08-04 DIAGNOSIS — K5909 Other constipation: Secondary | ICD-10-CM | POA: Diagnosis not present

## 2020-08-04 DIAGNOSIS — Z0000 Encounter for general adult medical examination without abnormal findings: Secondary | ICD-10-CM

## 2020-08-04 DIAGNOSIS — M25562 Pain in left knee: Secondary | ICD-10-CM

## 2020-08-04 DIAGNOSIS — M25561 Pain in right knee: Secondary | ICD-10-CM

## 2020-08-04 DIAGNOSIS — R140 Abdominal distension (gaseous): Secondary | ICD-10-CM | POA: Diagnosis not present

## 2020-08-04 DIAGNOSIS — M25511 Pain in right shoulder: Secondary | ICD-10-CM

## 2020-08-04 MED ORDER — LUBIPROSTONE 24 MCG PO CAPS: 24.0000 ug | ORAL_CAPSULE | Freq: Two times a day (BID) | ORAL | 2 refills | Status: DC

## 2020-08-04 NOTE — Progress Notes (Signed)
Complete physical exam   Patient: Krystal Woodward   DOB: 11/18/1955   65 y.o. Female  MRN: [id number] Visit Date: 08/04/2020  Today's healthcare provider: [name], MD   Chief Complaint  Patient presents with  . Annual Exam  . Prediabetes   Subjective    Krystal Woodward is a 65 y.o. female who presents today for a complete physical exam.  She reports consuming a general diet. Gym/ health club routine includes treadmill. She generally feels fairly well. She reports sleeping fairly well. She does have additional problems to discuss today (right shoulder and knee pain).  Last pap smear: 01/21/2019- normal/ HPV negative (should repeat in 3-5 years) Mammogram: 05/20/2020- Bi Rads Cat I negative   HPI  Prediabetes, Follow-up  Lab Results  Component Value Date   HGBA1C 5.8 (A) 01/21/2019   HGBA1C 5.8 (H) 01/18/2018   HGBA1C 5.7 (H) 01/16/2017   GLUCOSE 88 01/21/2019   GLUCOSE 101 (H) 10/07/2018   GLUCOSE 80 01/18/2018    Last seen for for this 3 years ago.  Management since that visit includes continue life style modifications. Current symptoms include none and have been stable.  Prior visit with dietician: no Current diet: in general, a "healthy" diet   Current exercise: walking  Pertinent Labs:    Component Value Date/Time   CHOL 135 01/21/2019 1059   TRIG 96 01/21/2019 1059   CHOLHDL 3.1 01/21/2019 1059   CREATININE 1.05 (H) 01/21/2019 1059    Wt Readings from Last 3 Encounters:  08/04/20 171 lb 3.2 oz (77.7 kg)  07/26/20 177 lb (80.3 kg)  07/01/20 172 lb 12.8 oz (78.4 kg)    -----------------------------------------------------------------------------------------  Follow up for ADD:  The patient was last seen for this 9 days ago. Changes made at last visit include starting Straterra.  She reports good compliance with treatment. She feels that condition is Unchanged. She is having side effects.  nausea  -----------------------------------------------------------------------------------------  Shoulder pain/knee pain: Patient reports having chronic pain in right shoulder and knees. She is currently on disability and needs paperwork completed for renewal.  She was previously followed by Dr. Ernest Pine but hasn't seen him for a couple of years. Is scheduled to see Dr. Waylan Rocher in June by her report.   Constipation: Patient reports that her last bowel movement was 5 days ago. She has tried using Metamucil with no improvement.  She states milk of mag does work, but will not have a BM until she takes it. Causes some discomfort and bloating.    Past Medical History:  Diagnosis Date  . 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  . Breast mass 01/24/2011  . Chest pain   . Elevated liver enzymes   . GERD (gastroesophageal reflux disease)    h/o  . Hyperprolactinemia (HCC) [id number]   Normal MRI  . Vitamin D deficiency    Past Surgical History:  Procedure Laterality Date  . BREAST BIOPSY Right 06/17/15   Inflammed Fibrocyst  . BREAST CYST EXCISION  02/27/2011   Procedure: CYST EXCISION BREAST;  Surgeon: Currie Paris, MD;  Location: [facility];  Service: General;  Laterality: Left;  Needle localization removal left breast mass  . BREAST EXCISIONAL BIOPSY Left   . BUNIONECTOMY     both feet  . COLONOSCOPY WITH PROPOFOL N/A 09/10/2019   Procedure: COLONOSCOPY WITH PROPOFOL;  Surgeon: Toledo, Boykin Nearing, MD;  Location: [facility];  Service: Gastroenterology;  Laterality: N/A;  . INCISION AND DRAINAGE  ABSCESS Right 07/28/2015   Procedure: INCISION AND DRAINAGE RIGHT BREAST  ABSCESS;  Surgeon: Manus Rudd, MD;  Location: [facility];  Service: General;  Laterality: Right;  . SHOULDER ARTHROSCOPY WITH ROTATOR CUFF REPAIR AND OPEN BICEPS TENODESIS  10/10/2018   Procedure: SHOULDER ARTHROSCOPY WITH OPEN ROTATOR CUFF REPAIR AND MINI OPEN BICEPS TENODESIS;  Surgeon:  Christena Flake, MD;  Location: [facility];  Service: Orthopedics;;  . SHOULDER ARTHROSCOPY WITH SUBACROMIAL DECOMPRESSION  10/10/2018   Procedure: SHOULDER ARTHROSCOPY WITH SUBACROMIAL DECOMPRESSION;  Surgeon: Christena Flake, MD;  Location: [facility];  Service: Orthopedics;;  . TUBAL LIGATION  1996   Social History   Socioeconomic History  . Marital status: Married    Spouse name: Not on file  . Number of children: 4  . Years of education: Not on file  . Highest education level: Not on file  Occupational History  . Occupation: CNA     Comment: [employer]  Tobacco Use  . Smoking status: Never Smoker  . Smokeless tobacco: Never Used  Vaping Use  . Vaping Use: Never used  Substance and Sexual Activity  . Alcohol use: No  . Drug use: No  . Sexual activity: Not on file  Other Topics Concern  . Not on file  Social History Narrative  . Not on file   Social Determinants of Health   Financial Resource Strain: Not on file  Food Insecurity: Not on file  Transportation Needs: Not on file  Physical Activity: Not on file  Stress: Not on file  Social Connections: Not on file  Intimate Partner Violence: Not on file   Family Status  Relation Name Status  . Mother  Deceased at age 72  . Father  Deceased at age 56's  . Sister  Deceased  . PGM  (Not Specified)   Family History  Problem Relation Age of Onset  . Cancer Mother        lymph nodes  . Transient ischemic attack Mother   . Diabetes Father   . Cancer Sister 62       2015 Rectal cancer, spread to Lymph node, lung cancer, non-smoker  . Asthma Paternal Grandmother   . Diabetes Paternal Grandmother    No Known Allergies  Patient Care Team: Malva Limes, MD as PCP - General (Family Medicine) Poggi, Excell Seltzer, MD as Consulting Physician (Surgery)   Medications: Outpatient Medications Prior to Visit  Medication Sig  . acetaminophen (TYLENOL) 500 MG tablet Take 1,000 mg by mouth every 8 (eight) hours as needed (pain).  Marland Kitchen  aspirin 81 MG tablet Take 81 mg by mouth daily.  Marland Kitchen atomoxetine (STRATTERA) 40 MG capsule Take 1 capsule (40 mg total) by mouth daily.  . Cholecalciferol 125 MCG (5000 UT) capsule Take 10,000 Units by mouth daily.  . cyclobenzaprine (FLEXERIL) 5 MG tablet Take 1 tablet by mouth three times daily as needed for muscle spasm  . diclofenac Sodium (VOLTAREN) 1 % GEL Apply 2 g topically 4 (four) times daily.  Marland Kitchen ibuprofen (ADVIL) 200 MG tablet Take 600-800 mg by mouth every 8 (eight) hours as needed (pain).  . Multiple Vitamins-Minerals (CENTRUM SILVER ULTRA WOMENS PO) Take 1 capsule by mouth daily.  . Omega-3 Fatty Acids (FISH OIL PO) Take 1,500 Units by mouth daily.  . TURMERIC CURCUMIN PO Take 1 tablet by mouth daily.   No facility-administered medications prior to visit.    Review of Systems  Constitutional: Negative for appetite change, chills, fatigue  and fever.  Respiratory: Negative for chest tightness and shortness of breath.   Cardiovascular: Negative for chest pain and palpitations.  Gastrointestinal: Positive for constipation and nausea. Negative for abdominal pain and vomiting.  Musculoskeletal: Positive for arthralgias and back pain.  Neurological: Negative for dizziness and weakness.      Objective    BP 112/71 (BP Location: Right Arm, Patient Position: Sitting, Cuff Size: Large)   Pulse 74   Temp (!) 97.5 F (36.4 C) (Temporal)   Resp 16   Ht 5\' 2"  (1.575 m)   Wt 171 lb 3.2 oz (77.7 kg)   BMI 31.31 kg/m     Physical Exam   General Appearance:    Obese female. Alert, cooperative, in no acute distress, appears stated age   Head:    Normocephalic, without obvious abnormality, atraumatic  Eyes:    PERRL, conjunctiva/corneas clear, EOM's intact, fundi    benign, both eyes  Ears:    Normal TM's and external ear canals, both ears  Nose:   Nares normal, septum midline, mucosa normal, no drainage    or sinus tenderness  Throat:   Lips, mucosa, and tongue normal; teeth and  gums normal  Neck:   Supple, symmetrical, trachea midline, no adenopathy;    thyroid:  no enlargement/tenderness/nodules; no carotid   bruit or JVD  Back:     Symmetric, no curvature, ROM normal, no CVA tenderness  Lungs:     Clear to auscultation bilaterally, respirations unlabored  Chest Wall:    No tenderness or deformity   Heart:    Normal heart rate. Normal rhythm.  1/6  Breast Exam:    normal appearance, no masses or tenderness, deferred  Abdomen:     Soft, non-tender, bowel sounds active all four quadrants,    no masses, no organomegaly  Pelvic:    cervix normal in appearance, external genitalia normal, uterus normal size, shape, and consistency and vagina normal without discharge  Extremities:   All extremities are intact. No cyanosis or edema  Pulses:   2+ and symmetric all extremities  Skin:   Skin color, texture, turgor normal, no rashes or lesions  Lymph nodes:   Cervical, supraclavicular, and axillary nodes normal  Neurologic:   CNII-XII intact, normal strength, sensation and reflexes    throughout    Last depression screening scores PHQ 2/9 Scores 08/04/2020 07/26/2020 05/17/2020  PHQ - 2 Score 0 0 0  PHQ- 9 Score 0 0 0   Last fall risk screening Fall Risk  07/26/2020  Falls in the past year? 0  Number falls in past yr: 0  Injury with Fall? 0  Risk for fall due to : -  Follow up -   Last Audit-C alcohol use screening Alcohol Use Disorder Test (AUDIT) 08/04/2020  1. How often do you have a drink containing alcohol? 0  2. How many drinks containing alcohol do you have on a typical day when you are drinking? 0  3. How often do you have six or more drinks on one occasion? 0  AUDIT-C Score 0  Alcohol Brief Interventions/Follow-up -   A score of 3 or more in women, and 4 or more in men indicates increased risk for alcohol abuse, EXCEPT if all of the points are from question 1   No results found for any visits on 08/04/20.  Assessment & Plan    Routine Health  Maintenance and Physical Exam  Exercise Activities and Dietary recommendations Goals   None  Immunization History  Administered Date(s) Administered  . Influenza,inj,Quad PF,6+ Mos 01/16/2017, 01/18/2018, 12/31/2018  . PFIZER(Purple Top)SARS-COV-2 Vaccination 07/28/2019, 08/19/2019  . Tdap 01/28/2010    Health Maintenance  Topic Date Due  . TETANUS/TDAP  01/29/2020  . COVID-19 Vaccine (3 - Booster for Pfizer series) 02/18/2020  . INFLUENZA VACCINE  11/22/2020  . PAP SMEAR-Modifier  01/20/2022  . MAMMOGRAM  05/20/2022  . COLONOSCOPY (Pts 45-68yrs Insurance coverage will need to be confirmed)  09/09/2024  . Hepatitis C Screening  Completed  . HIV Screening  Completed  . HPV VACCINES  Aged Out    Discussed health benefits of physical activity, and encouraged her to engage in regular exercise appropriate for her age and condition.  1. Annual physical exam  - Lipid panel - CBC  2. Pre-diabetes  - Comprehensive metabolic panel - Hemoglobin A1c  3. Chronic constipation Not improved since starting daily fiber supplement.  - lubiprostone (AMITIZA) 24 MCG capsule; Take 1 capsule (24 mcg total) by mouth 2 (two) times daily with a meal.  Dispense: 60 capsule; Refill: 2   - TSH - US Pelvis Complete; Future  4. Abdominal bloating  - US Pelvis Complete; Future  5. Cervical cancer screening Extensively counseled on current recommendations for cervical cancer and HPV screening. She understands that she is not due for screening according to current guidelines and early screening may not be covered but wished to proceed with screening - Cytology - HPV w/PAP any interpr. 16/18 genotyping if + HPV (reflex) ([facility])      The entirety of the information documented in the History of Present Illness, Review of Systems and Physical Exam were personally obtained by me. Portions of this information were initially documented by the CMA and reviewed by me for thoroughness and  accuracy.      [name], MD  [place] [phone] (phone) [phone] (fax)  [facility]

## 2020-08-06 LAB — CYTOLOGY - PAP
Comment: NEGATIVE
Diagnosis: NEGATIVE
High risk HPV: NEGATIVE

## 2020-08-11 ENCOUNTER — Other Ambulatory Visit: Payer: Self-pay

## 2020-08-11 ENCOUNTER — Ambulatory Visit (HOSPITAL_COMMUNITY)
Admission: RE | Admit: 2020-08-11 | Discharge: 2020-08-11 | Disposition: A | Discharge status: Home / Self Care | Payer: No Typology Code available for payment source | Source: Ambulatory Visit | Attending: Family Medicine | Admitting: Family Medicine

## 2020-08-11 DIAGNOSIS — R140 Abdominal distension (gaseous): Secondary | ICD-10-CM | POA: Insufficient documentation

## 2020-08-11 DIAGNOSIS — K5909 Other constipation: Secondary | ICD-10-CM | POA: Insufficient documentation

## 2020-08-13 ENCOUNTER — Ambulatory Visit
Admission: RE | Admit: 2020-08-13 | Discharge: 2020-08-13 | Disposition: A | Discharge status: Home / Self Care | Payer: No Typology Code available for payment source | Source: Ambulatory Visit | Attending: Family Medicine | Admitting: Family Medicine

## 2020-08-13 ENCOUNTER — Ambulatory Visit (INDEPENDENT_AMBULATORY_CARE_PROVIDER_SITE_OTHER): Payer: No Typology Code available for payment source | Admitting: Family Medicine

## 2020-08-13 ENCOUNTER — Encounter: Payer: Self-pay | Admitting: Family Medicine

## 2020-08-13 ENCOUNTER — Other Ambulatory Visit: Payer: Self-pay

## 2020-08-13 ENCOUNTER — Ambulatory Visit
Admission: RE | Admit: 2020-08-13 | Discharge: 2020-08-13 | Disposition: A | Discharge status: Home / Self Care | Payer: No Typology Code available for payment source | Attending: Family Medicine | Admitting: Family Medicine

## 2020-08-13 VITALS — BP 118/69 | HR 68 | Resp 16 | Wt 168.0 lb

## 2020-08-13 DIAGNOSIS — M25572 Pain in left ankle and joints of left foot: Secondary | ICD-10-CM

## 2020-08-13 DIAGNOSIS — M79672 Pain in left foot: Secondary | ICD-10-CM | POA: Diagnosis present

## 2020-08-13 NOTE — Progress Notes (Signed)
Established patient visit   Patient: Krystal Woodward   DOB: [birth year]   65 y.o. Female  MRN: [id number] Visit Date: 08/13/2020  Today's healthcare provider: Megan Mans, MD   Chief Complaint  Patient presents with  . Ankle Pain   Subjective    Ankle Pain  There was no injury mechanism. The pain is present in the left ankle. The pain is moderate. Associated symptoms include an inability to bear weight. Pertinent negatives include no tingling. She reports no foreign bodies present. The symptoms are aggravated by movement and weight bearing.    She continues to have pain and is going out of town with her family soon.       Medications: Outpatient Medications Prior to Visit  Medication Sig  . acetaminophen (TYLENOL) 500 MG tablet Take 1,000 mg by mouth every 8 (eight) hours as needed (pain).  Marland Kitchen aspirin 81 MG tablet Take 81 mg by mouth daily.  Marland Kitchen atomoxetine (STRATTERA) 40 MG capsule Take 1 capsule (40 mg total) by mouth daily.  . Cholecalciferol 125 MCG (5000 UT) capsule Take 10,000 Units by mouth daily.  . cyclobenzaprine (FLEXERIL) 5 MG tablet Take 1 tablet by mouth three times daily as needed for muscle spasm  . diclofenac Sodium (VOLTAREN) 1 % GEL Apply 2 g topically 4 (four) times daily.  Marland Kitchen ibuprofen (ADVIL) 200 MG tablet Take 600-800 mg by mouth every 8 (eight) hours as needed (pain).  . lubiprostone (AMITIZA) 24 MCG capsule Take 1 capsule (24 mcg total) by mouth 2 (two) times daily with a meal.  . Multiple Vitamins-Minerals (CENTRUM SILVER ULTRA WOMENS PO) Take 1 capsule by mouth daily.  . Omega-3 Fatty Acids (FISH OIL PO) Take 1,500 Units by mouth daily.  . TURMERIC CURCUMIN PO Take 1 tablet by mouth daily.   No facility-administered medications prior to visit.    Review of Systems  Constitutional: Negative for activity change and fatigue.  Respiratory: Negative for cough and shortness of breath.   Cardiovascular: Negative for chest pain and leg swelling.   Neurological: Negative for dizziness, tingling and headaches.        Objective    BP 118/69   Pulse 68   Resp 16   Wt 168 lb (76.2 kg)   BMI 30.73 kg/m  BP Readings from Last 3 Encounters:  08/13/20 118/69  08/04/20 112/71  07/26/20 131/68   Wt Readings from Last 3 Encounters:  08/13/20 168 lb (76.2 kg)  08/04/20 171 lb 3.2 oz (77.7 kg)  07/26/20 177 lb (80.3 kg)       Physical Exam Vitals reviewed.  Constitutional:      Appearance: Normal appearance.  HENT:     Right Ear: External ear normal.     Left Ear: External ear normal.  Eyes:     General: No scleral icterus. Cardiovascular:     Rate and Rhythm: Normal rate and regular rhythm.     Heart sounds: Normal heart sounds.  Pulmonary:     Breath sounds: Normal breath sounds.  Abdominal:     Palpations: Abdomen is soft.  Skin:    Comments: Minimal tenderness anterior inferior to the lateral left malleolus.  Neurological:     General: No focal deficit present.     Mental Status: She is alert and oriented to person, place, and time.  Psychiatric:        Mood and Affect: Mood normal.        Behavior: Behavior normal.  Thought Content: Thought content normal.        Judgment: Judgment normal.       No results found for any visits on 08/13/20.  Assessment & Plan     1. Left foot pain Mild Trauma. - DG Foot Complete Left; Future  2. Acute left ankle pain -This is a mild anterior ankle sprain.  Try to rule out fracture.  If there is a fracture refer to orthopedics or if pain persist with weightbearing. - DG Ankle Complete Left; Future    No follow-ups on file.      I, Megan Mans, MD, have reviewed all documentation for this visit. The documentation on 08/20/20 for the exam, diagnosis, procedures, and orders are all accurate and complete.    Clarice Bonaventure Wendelyn Breslow, MD  [facility] [phone] (phone) [phone] (fax)  [facility]

## 2020-08-16 ENCOUNTER — Telehealth: Payer: Self-pay | Admitting: *Deleted

## 2020-08-16 NOTE — Telephone Encounter (Signed)
Patient saw pap smear results in MyChart and had questions. Answered all questions at this time and reminded the patient she still needs to have yearly labs drawn soon.

## 2020-08-19 ENCOUNTER — Telehealth: Payer: Self-pay

## 2020-08-19 ENCOUNTER — Other Ambulatory Visit
Admission: RE | Admit: 2020-08-19 | Discharge: 2020-08-19 | Disposition: A | Discharge status: Home / Self Care | Payer: No Typology Code available for payment source | Source: Ambulatory Visit | Attending: Family Medicine | Admitting: Family Medicine

## 2020-08-19 DIAGNOSIS — Z0000 Encounter for general adult medical examination without abnormal findings: Secondary | ICD-10-CM | POA: Insufficient documentation

## 2020-08-19 DIAGNOSIS — K5909 Other constipation: Secondary | ICD-10-CM | POA: Insufficient documentation

## 2020-08-19 DIAGNOSIS — R7303 Prediabetes: Secondary | ICD-10-CM | POA: Diagnosis not present

## 2020-08-19 LAB — COMPREHENSIVE METABOLIC PANEL
ALT: 15 U/L (ref 0–44)
AST: 21 U/L (ref 15–41)
Albumin: 4.1 g/dL (ref 3.5–5.0)
Alkaline Phosphatase: 104 U/L (ref 38–126)
Anion gap: 8 (ref 5–15)
BUN: 16 mg/dL (ref 8–23)
CO2: 26 mmol/L (ref 22–32)
Calcium: 9.7 mg/dL (ref 8.9–10.3)
Chloride: 105 mmol/L (ref 98–111)
Creatinine, Ser: 0.92 mg/dL (ref 0.44–1.00)
GFR, Estimated: >60 mL/min (ref >60)
Glucose, Bld: 87 mg/dL (ref 70–99)
Potassium: 3.9 mmol/L (ref 3.5–5.1)
Sodium: 139 mmol/L (ref 135–145)
Total Bilirubin: 0.7 mg/dL (ref 0.3–1.2)
Total Protein: 7.6 g/dL (ref 6.5–8.1)

## 2020-08-19 LAB — CBC
HCT: 37.6 % (ref 36.0–46.0)
Hemoglobin: 12.5 g/dL (ref 12.0–15.0)
MCH: 31.3 pg (ref 26.0–34.0)
MCHC: 33.2 g/dL (ref 30.0–36.0)
MCV: 94 fL (ref 80.0–100.0)
Platelets: 243 10*3/uL (ref 150–400)
RBC: 4 MIL/uL (ref 3.87–5.11)
RDW: 12.6 % (ref 11.5–15.5)
WBC: 8.5 10*3/uL (ref 4.0–10.5)
nRBC: 0 % (ref 0.0–0.2)

## 2020-08-19 LAB — LIPID PANEL
Cholesterol: 153 mg/dL (ref 0–200)
HDL: 43 mg/dL (ref >40)
LDL Cholesterol: 100 mg/dL — ABNORMAL HIGH (ref 0–99)
Total CHOL/HDL Ratio: 3.6 RATIO
Triglycerides: 50 mg/dL (ref <150)
VLDL: 10 mg/dL (ref 0–40)

## 2020-08-19 LAB — HEMOGLOBIN A1C
Hgb A1c MFr Bld: 5.7 % — ABNORMAL HIGH (ref 4.8–5.6)
Mean Plasma Glucose: 116.89 mg/dL

## 2020-08-19 LAB — TSH: TSH: 1.467 u[IU]/mL (ref 0.350–4.500)

## 2020-08-19 NOTE — Telephone Encounter (Signed)
Copied from CRM [id number]. Topic: General - Call Back - No Documentation >> Aug 19, 2020  4:55 PM Randol Kern wrote: Toto calling from [employer], to follow up on the letter that [facility] faxed on 08/16/2020 Best contact: [phone] exti [phone]  The nurses contact information is in the letter.

## 2020-08-23 ENCOUNTER — Ambulatory Visit (INDEPENDENT_AMBULATORY_CARE_PROVIDER_SITE_OTHER): Payer: No Typology Code available for payment source | Admitting: Family Medicine

## 2020-08-23 ENCOUNTER — Other Ambulatory Visit: Payer: Self-pay

## 2020-08-23 ENCOUNTER — Encounter: Payer: Self-pay | Admitting: Family Medicine

## 2020-08-23 VITALS — BP 110/68 | HR 58 | Temp 97.7°F | Resp 16 | Wt 168.6 lb

## 2020-08-23 DIAGNOSIS — R109 Unspecified abdominal pain: Secondary | ICD-10-CM

## 2020-08-23 DIAGNOSIS — R10A2 Flank pain, left side: Secondary | ICD-10-CM

## 2020-08-23 MED ORDER — AMPHETAMINE-DEXTROAMPHETAMINE 5 MG PO TABS: 5.0000 mg | ORAL_TABLET | Freq: Two times a day (BID) | ORAL | 0 refills | Status: DC

## 2020-08-23 NOTE — Progress Notes (Signed)
Established patient visit   Patient: Krystal Woodward   DOB: [birth year]   65 y.o. Female  MRN: [id number] Visit Date: 08/23/2020  Today's healthcare provider: [name], MD   Chief Complaint  Patient presents with  . ADD   Subjective    HPI  Follow up for ADD:  The patient was last seen for this on 07/26/2020.   Changes made at last visit include starting- atomoxetine (STRATTERA) 40 MG capsule; Take 1 capsule (40 mg total) by mouth daily x 2 weeks, then increase to 2 capsules daily.  She reports poor compliance with treatment. She feels that condition is Unchanged. She is having side effects. Patient states she stopped taking this medication 1 week ago due to it causing nausea. She doesn't feel like it was helping with symptoms.   -----------------------------------------------------------------------------------------  She also reports she has been experiencing sharp shooting pains around her left flank lasting extended period of time and radiating into left pelvic region. She recently had negative pelvic CT. She is not having pain today, but did over the weekend.      Medications: Outpatient Medications Prior to Visit  Medication Sig  . acetaminophen (TYLENOL) 500 MG tablet Take 1,000 mg by mouth every 8 (eight) hours as needed (pain).  Marland Kitchen aspirin 81 MG tablet Take 81 mg by mouth daily.  . Cholecalciferol 125 MCG (5000 UT) capsule Take 10,000 Units by mouth daily.  . cyclobenzaprine (FLEXERIL) 5 MG tablet Take 1 tablet by mouth three times daily as needed for muscle spasm  . diclofenac Sodium (VOLTAREN) 1 % GEL Apply 2 g topically 4 (four) times daily.  Marland Kitchen ibuprofen (ADVIL) 200 MG tablet Take 600-800 mg by mouth every 8 (eight) hours as needed (pain).  . lubiprostone (AMITIZA) 24 MCG capsule Take 1 capsule (24 mcg total) by mouth 2 (two) times daily with a meal.  . Multiple Vitamins-Minerals (CENTRUM SILVER ULTRA WOMENS PO) Take 1 capsule by mouth daily.  . Omega-3  Fatty Acids (FISH OIL PO) Take 1,500 Units by mouth daily.  . TURMERIC CURCUMIN PO Take 1 tablet by mouth daily.  Marland Kitchen atomoxetine (STRATTERA) 40 MG capsule Take 1 capsule (40 mg total) by mouth daily. (Patient not taking: Reported on 08/23/2020)   No facility-administered medications prior to visit.    Review of Systems  Constitutional: Negative for appetite change, chills, fatigue and fever.  Respiratory: Negative for chest tightness and shortness of breath.   Cardiovascular: Negative for chest pain and palpitations.  Gastrointestinal: Negative for abdominal pain, nausea and vomiting.  Neurological: Negative for dizziness and weakness.       Objective    BP 110/68 (BP Location: Left Arm, Patient Position: Sitting, Cuff Size: Normal)   Pulse (!) 58   Temp 97.7 F (36.5 C) (Temporal)   Resp 16   Wt 168 lb 9.6 oz (76.5 kg)   BMI 30.84 kg/m     Physical Exam   General appearance: Mildly obese female, cooperative and in no acute distress Head: Normocephalic, without obvious abnormality, atraumatic Respiratory: Respirations even and unlabored, normal respiratory rate Extremities: All extremities are intact.  Skin: Skin color, texture, turgor normal. No rashes seen  Psych: Appropriate mood and affect. Neurologic: Mental status: Alert, oriented to person, place, and time, thought content appropriate.     Assessment & Plan     1. Left flank pain She is very concerned about iodine exposure in contrast and potential of adverse effect from CT scanning such as radiation  exposure.  - MR Abdomen W Wo Contrast; Future  2. Abdominal pain, unspecified abdominal location  - MR Abdomen W Wo Contrast; Future  3. ADD Intolerant to Strattera. Counseled regarding various classes of on-label and off label ADD medications including potential cardiovascular effects of stimulant medications, which tend to be the most effective. Her BP and HR are very good and is very low risk for CV disease. Will  try amphetamine-dextroamphetamine (ADDERALL) 5 MG tablet; Take 1 tablet (5 mg total) by mouth two times daily.  Dispense: 60 tablet; Refill: 0    Follow up ADD in one month.      The entirety of the information documented in the History of Present Illness, Review of Systems and Physical Exam were personally obtained by me. Portions of this information were initially documented by the CMA and reviewed by me for thoroughness and accuracy.      [name], MD  [facility] [phone] (phone) [phone] (fax)  [facility]

## 2020-09-21 ENCOUNTER — Ambulatory Visit: Payer: Self-pay | Admitting: Family Medicine

## 2020-09-21 NOTE — Progress Notes (Deleted)
      Established patient visit   Patient: Krystal Woodward   DOB: 07-01-1955   65 y.o. Female  MRN: [id number] Visit Date: 09/21/2020  Today's healthcare provider: [name], MD   No chief complaint on file.  Subjective    HPI  Follow up for ADD:  The patient was last seen for this on 08/23/2020.   Changes made at last visit include trying amphetamine-dextroamphetamine (ADDERALL) 5 MG tablet; Take 1 tablet (5 mg total) by mouth two times daily.  She reports {excellent/good/fair/poor:19665} compliance with treatment. She feels that condition is {improved/worse/unchanged:3041574}. She {is/is not:21021397} having side effects. ***  -----------------------------------------------------------------------------------------  Follow up for left flank pain:  The patient was last seen for this on 08/23/2020.  Changes made at last visit include ordering an MRI. After that visit, our referral coordinator made several attempts to contact patient to have this test scheduled. Patient did not return any of her calls, so MRI has bot been completed.  She reports {excellent/good/fair/poor:19665} compliance with treatment. She feels that condition is {improved/worse/unchanged:3041574}. She {is/is not:21021397} having side effects. ***  -----------------------------------------------------------------------------------------    {Show patient history (optional):23778::" "}   Medications: Outpatient Medications Prior to Visit  Medication Sig  . acetaminophen (TYLENOL) 500 MG tablet Take 1,000 mg by mouth every 8 (eight) hours as needed (pain).  Marland Kitchen amphetamine-dextroamphetamine (ADDERALL) 5 MG tablet Take 1 tablet (5 mg total) by mouth 2 (two) times daily.  Marland Kitchen aspirin 81 MG tablet Take 81 mg by mouth daily.  . Cholecalciferol 125 MCG (5000 UT) capsule Take 10,000 Units by mouth daily.  . cyclobenzaprine (FLEXERIL) 5 MG tablet Take 1 tablet by mouth three times daily as needed for muscle spasm   . diclofenac Sodium (VOLTAREN) 1 % GEL Apply 2 g topically 4 (four) times daily.  Marland Kitchen ibuprofen (ADVIL) 200 MG tablet Take 600-800 mg by mouth every 8 (eight) hours as needed (pain).  . lubiprostone (AMITIZA) 24 MCG capsule Take 1 capsule (24 mcg total) by mouth 2 (two) times daily with a meal.  . Multiple Vitamins-Minerals (CENTRUM SILVER ULTRA WOMENS PO) Take 1 capsule by mouth daily.  . Omega-3 Fatty Acids (FISH OIL PO) Take 1,500 Units by mouth daily.  . TURMERIC CURCUMIN PO Take 1 tablet by mouth daily.   No facility-administered medications prior to visit.    Review of Systems  {Labs  Heme  Chem  Endocrine  Serology  Results Review (optional):23779::" "}   Objective    There were no vitals taken for this visit. {Show previous vital signs (optional):23777::" "}   Physical Exam  ***  No results found for any visits on 09/21/20.  Assessment & Plan     ***  No follow-ups on file.      {provider attestation***:1}   [name], MD  [facility] [phone] (phone) [phone] (fax)  [facility]

## 2020-10-01 ENCOUNTER — Telehealth: Payer: Self-pay

## 2020-10-01 NOTE — Telephone Encounter (Signed)
Copied from CRM [id number]. Topic: General - Other >> Oct 01, 2020  1:39 PM Leafy Ro wrote: Reason for CRM: Eddie Candle with [employer] is calling checking on the fax that was sent to office this morning. The form is IME. Eddie Candle will refax the IME form

## 2020-10-04 NOTE — Telephone Encounter (Signed)
Have you seen this form?  Thanks,   -Artisha Capri  

## 2020-10-05 NOTE — Telephone Encounter (Signed)
Reviewed, signed and sent to medical records.

## 2020-10-06 ENCOUNTER — Ambulatory Visit: Payer: No Typology Code available for payment source | Attending: Orthopedic Surgery | Admitting: Physical Therapy

## 2020-10-06 ENCOUNTER — Other Ambulatory Visit: Payer: Self-pay

## 2020-10-06 ENCOUNTER — Encounter: Payer: Self-pay | Admitting: Physical Therapy

## 2020-10-06 DIAGNOSIS — M25562 Pain in left knee: Secondary | ICD-10-CM | POA: Diagnosis present

## 2020-10-06 DIAGNOSIS — M25561 Pain in right knee: Secondary | ICD-10-CM | POA: Diagnosis present

## 2020-10-06 DIAGNOSIS — G8929 Other chronic pain: Secondary | ICD-10-CM | POA: Insufficient documentation

## 2020-10-06 NOTE — Therapy (Signed)
[facility] [address] [place], [address] Phone: [phone]   Fax:  [phone]  Physical Therapy Evaluation  Patient Details  Name: Rhythm Gubbels MRN: [id number] Date of Birth: 01-Jan-1956 No data recorded  Encounter Date: 10/06/2020   PT End of Session - 10/06/20 0914     Visit Number 1    Number of Visits 17    Date for PT Re-Evaluation 11/03/20    PT Start Time 0828    PT Stop Time 0910    PT Time Calculation (min) 42 min    Activity Tolerance Patient tolerated treatment well    Behavior During Therapy [facility] for tasks assessed/performed             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   Breast mass 01/24/2011   Chest pain    Elevated liver enzymes    GERD (gastroesophageal reflux disease)    h/o   Hyperprolactinemia (HCC) 9371;[id number]   Normal MRI   Vitamin D deficiency     Past Surgical History:  Procedure Laterality Date   BREAST BIOPSY Right 06/17/15   Inflammed Fibrocyst   BREAST CYST EXCISION  02/27/2011   Procedure: CYST EXCISION BREAST;  Surgeon: Currie Paris, MD;  Location: [facility];  Service: General;  Laterality: Left;  Needle localization removal left breast mass   BREAST EXCISIONAL BIOPSY Left    BUNIONECTOMY     both feet   COLONOSCOPY WITH PROPOFOL N/A 09/10/2019   Procedure: COLONOSCOPY WITH PROPOFOL;  Surgeon: Toledo, Boykin Nearing, MD;  Location: [facility];  Service: Gastroenterology;  Laterality: N/A;   INCISION AND DRAINAGE ABSCESS Right 07/28/2015   Procedure: INCISION AND DRAINAGE RIGHT BREAST  ABSCESS;  Surgeon: Manus Rudd, MD;  Location: [facility];  Service: General;  Laterality: Right;   SHOULDER ARTHROSCOPY WITH ROTATOR CUFF REPAIR AND OPEN BICEPS TENODESIS  10/10/2018   Procedure: SHOULDER ARTHROSCOPY WITH OPEN ROTATOR CUFF REPAIR AND MINI OPEN BICEPS TENODESIS;  Surgeon: Christena Flake, MD;  Location: [facility];  Service: Orthopedics;;   SHOULDER ARTHROSCOPY WITH SUBACROMIAL DECOMPRESSION  10/10/2018   Procedure: SHOULDER ARTHROSCOPY WITH SUBACROMIAL DECOMPRESSION;  Surgeon: Christena Flake, MD;  Location: [facility];  Service: Orthopedics;;   TUBAL LIGATION  1996    There were no vitals filed for this visit.    Subjective Assessment - 10/06/20 0828     Subjective Reia Dumm is a 65 year old female with complaints of bilateral knee pain with the L knee occasionally buckling with walking, standing, and stair negotiation. Pt states her knee pain became more noticeable on May 21st after standing from a chair. Pt reports her pain today is 3-4/10 but increases and becomes more sharp with previously stated activities. Pt lives in a two story home with stairs to enter and access the 2nd floor. Pt reports having one fall in last six months without neccessary hospitilization. Patient denies any numbness or tingling and any red flags that would prevent physical therapy. Pt has previously had therapy for RCR and was previously given exercises to address knee pain. 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 Pt would like to perform ADLs and return to weight lifting with less pain. Pt reports being allergic to latex.    Pertinent History Grettel Rames is a 65 year old female with complaints of bilateral knee pain  with the L knee occasionally buckling with walking, standing, and stair negotiation. Pt states her knee pain became more noticeable on May 21st after standing from a chair. Pt reports her pain today is 3-4/10 but increases and becomes more sharp with previously stated activities. Pt lives in a two story home with stairs to enter and access the 2nd floor. Pt reports having one fall in last six months without neccessary hospitilization. Patient denies any numbness or tingling and any red flags that would prevent physical therapy. Pt has previously had therapy for  RCR and was previously given exercises to address knee pain. 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 Pt would like to perform ADLs and return to weight lifting with less pain. Pt reports being allergic to latex.    Limitations Standing;Lifting;Walking    How long can you sit comfortably? Unlimited    Diagnostic tests XRAY  AP and lateral of the bilateral knees dated today demonstrate mild patellofemoral narrowing. The medial and lateral compartments appear normal. (Aluisio    Patient Stated Goals Patient would like to return to lifting weights and perform stair negotiation with less pain.    Currently in Pain? Yes    Pain Score 4     Pain Location Knee    Pain Orientation Right    Pain Descriptors / Indicators Sharp    Pain Type Chronic pain    Pain Onset More than a month ago    Pain Frequency Several days a week    Aggravating Factors  Stairs, standing, squatting    Pain Relieving Factors brace    Effect of Pain on Daily Activities Pain limits daily activities    Multiple Pain Sites Yes    Pain Location Knee    Pain Orientation Left    Pain Descriptors / Indicators Sharp    Pain Type Chronic pain    Pain Onset More than a month ago    Pain Frequency Several days a week    Effect of Pain on Daily Activities pain limits activity              Knee AROM  WNL Bilaterally  Knee Strength   MMT knee flex/ext, and hip abd/add/flex 4/5 bilat  *defer further hip MMT, 5xSTS to next visit  Knee extension limited by pain bilaterally  LE Sensation:  Intact bilaterally  Special tests & findings   Joint line tenderness (-) Swelling & erythema (-)  Anterior Drawer (-) Posterior Drawer (-)  McMurrays (-)  Patella tracking WNL bilaterally  Patient descends stairs utilizing step to pattern.  Normal patellar movement M/L A/P bilat; without pain or crepitus  STS transfer pain limited use of UEs  Stair Assessment: Patient demonstrated step to  pattern when descending stairs due to knee pain and evident gluteus medius weakness. Patellar tracking appeared WNL Ascent antalgic with reciprocal pattern Unilateral handrail utilized for stair asssessment  Squat Assessment: Patient reported anterior knee pain at approx 45-60 degrees knee flex; increased ant tib translation bilat    Therapeutic Exercise (HEP) PT reviewed the following HEP with patient with patient able to demonstrate a set of the following with min cuing for correction needed. PT educated patient on parameters of therex (how/when to inc/decrease intensity, frequency, rep/set range, stretch hold time, and purpose of therex) with verbalized understanding.  Mini squats 2 x 20 reps  Seated Hip Flex 2 x 20 reps Seated Hip ABD 2 x 20 reps Seated Knee Ext 2 x 20  reps        Objective measurements completed on examination: See above findings.               PT Education - 10/06/20 1057     Education Details Pt educated on plan of care and HEP.    Person(s) Educated Patient    Methods Explanation    Comprehension Verbalized understanding;Returned demonstration              PT Short Term Goals - 10/06/20 1120       PT SHORT TERM GOAL #1   Title Patient will be independent with home exercise program to improve mobility and self manage symptoms.    Baseline Patient has previously received exercises but has not become independent.    Time 4    Period Weeks    Status New    Target Date 11/03/20               PT Long Term Goals - 10/06/20 1356       PT LONG TERM GOAL #1   Title Pt will be able to negotiate stairs utilizing reciprocal pattern with reports of pain no greater than 1/10 on NPRS in order to safely enter home and community buildings.    Baseline 10/06/2020: descends stairs with pain 4/10 utilizing lateral step-to pattern.    Time 8    Period Weeks    Status New      PT LONG TERM GOAL #2   Title Pt will report a score of 42 on  FOTO in to demonstrate improvement in tolerance for functional acitivies.    Baseline 10/06/20: 61    Time 8    Period Weeks    Status New      PT LONG TERM GOAL #3   Title Patient will demonstrate gross lower extremity strength as 5/5 bilaterally in order to perform ADLs and return to prior exercise regimen.    Baseline 10/06/20: gross LE strength 4/4 bilaterally    Time 8    Period Weeks    Status New                    Plan - 10/06/20 1101     Clinical Impression Statement Patient is a 65 year old female with c/o bilateral knee pain L>R. 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 Patient impairments include lower extremity weakness, pain, decreased activity tolerance. Activity limitations in squatting, standing, stairs and normal exercise regimen. Patients impairments limit participation in activities at home and in the community. Patient will benefit from skilled physical therapy to treat impairments and address goals in order to prevent falls and risk for further injury.    Personal Factors and Comorbidities Age;Fitness    Examination-Activity Limitations Locomotion Level;Lift;Stairs;Stand;Squat    Examination-Participation Restrictions Psychiatric nurse Stable/Uncomplicated    Clinical Decision Making Low    Rehab Potential Good    PT Frequency 2x / week    PT Duration 8 weeks    PT Treatment/Interventions Cryotherapy;Electrical Stimulation;Moist Heat;Traction;Gait training;Stair training;Functional mobility training;Therapeutic activities;Therapeutic exercise;Balance training;Neuromuscular re-education;Patient/family education;Manual techniques;Taping;Dry needling;Passive range of motion;Joint Manipulations    PT Next Visit Plan Initiate LE strengthening and stair training    PT Home Exercise Plan mini squat, seated knee ext, hip flex, hip abd    Consulted and Agree with  Plan of Care Patient  Patient will benefit from skilled therapeutic intervention in order to improve the following deficits and impairments:  Decreased mobility, Decreased endurance, Difficulty walking, Decreased activity tolerance, Decreased strength, Pain, Abnormal gait, Decreased balance, Improper body mechanics, Postural dysfunction  Visit Diagnosis: Chronic pain of left knee  Chronic pain of right knee     Problem List Patient Active Problem List   Diagnosis Date Noted   Right shoulder pain 08/04/2020   Bilateral cataracts 01/20/2018   Post-menopausal 01/14/2016   Pre-diabetes 01/06/2015   Bilateral knee pain 01/04/2015   Overweight 01/04/2015   Hyperprolactinemia (HCC) 01/04/2015   Fatigue 01/04/2015   Family history of diabetes mellitus 01/04/2015   Family history of colon cancer 01/04/2015   MURMUR 06/30/2010    Hilda Lias DPT Romilda Joy, SPT  Hilda Lias 10/06/2020, 2:06 PM  [facility] [address] [place], [address] Phone: [phone]   Fax:  [phone]  Name: Lerae Langham MRN: [id number] Date of Birth: 10-06-1955

## 2020-10-11 ENCOUNTER — Other Ambulatory Visit: Payer: Self-pay

## 2020-10-11 ENCOUNTER — Ambulatory Visit: Payer: No Typology Code available for payment source | Admitting: Physical Therapy

## 2020-10-11 ENCOUNTER — Encounter: Payer: Self-pay | Admitting: Physical Therapy

## 2020-10-11 DIAGNOSIS — G8929 Other chronic pain: Secondary | ICD-10-CM

## 2020-10-11 DIAGNOSIS — M25562 Pain in left knee: Secondary | ICD-10-CM | POA: Diagnosis not present

## 2020-10-11 NOTE — Therapy (Signed)
[facility] [address], [place], [address] Phone: [phone]   Fax:  [phone]  Physical Therapy Treatment  Patient Details  Name: Krystal Woodward MRN: [id number] Date of Birth: March 18, 1956 No data recorded  Encounter Date: 10/11/2020   PT End of Session - 10/11/20 1003     Visit Number 2    Number of Visits 17    Date for PT Re-Evaluation 11/03/20    PT Start Time 0905    PT Stop Time 0945    PT Time Calculation (min) 40 min    Activity Tolerance Patient tolerated treatment well    Behavior During Therapy [facility] for tasks assessed/performed             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   Breast mass 01/24/2011   Chest pain    Elevated liver enzymes    GERD (gastroesophageal reflux disease)    h/o   Hyperprolactinemia (HCC) [id number]   Normal MRI   Vitamin D deficiency     Past Surgical History:  Procedure Laterality Date   BREAST BIOPSY Right 06/17/15   Inflammed Fibrocyst   BREAST CYST EXCISION  02/27/2011   Procedure: CYST EXCISION BREAST;  Surgeon: Currie Paris, MD;  Location: [facility];  Service: General;  Laterality: Left;  Needle localization removal left breast mass   BREAST EXCISIONAL BIOPSY Left    BUNIONECTOMY     both feet   COLONOSCOPY WITH PROPOFOL N/A 09/10/2019   Procedure: COLONOSCOPY WITH PROPOFOL;  Surgeon: Toledo, Boykin Nearing, MD;  Location: [facility];  Service: Gastroenterology;  Laterality: N/A;   INCISION AND DRAINAGE ABSCESS Right 07/28/2015   Procedure: INCISION AND DRAINAGE RIGHT BREAST  ABSCESS;  Surgeon: Manus Rudd, MD;  Location: [facility];  Service: General;  Laterality: Right;   SHOULDER ARTHROSCOPY WITH ROTATOR CUFF REPAIR AND OPEN BICEPS TENODESIS  10/10/2018   Procedure: SHOULDER ARTHROSCOPY WITH OPEN ROTATOR CUFF REPAIR AND MINI OPEN BICEPS TENODESIS;  Surgeon: Christena Flake, MD;  Location: [facility];  Service: Orthopedics;;   SHOULDER ARTHROSCOPY WITH SUBACROMIAL DECOMPRESSION  10/10/2018   Procedure: SHOULDER ARTHROSCOPY WITH SUBACROMIAL DECOMPRESSION;  Surgeon: Christena Flake, MD;  Location: [facility];  Service: Orthopedics;;   TUBAL LIGATION  1996    There were no vitals filed for this visit.   Subjective Assessment - 10/11/20 0908     Subjective Pt presents to therapy with 3-4/10 in bilateral knees with activities. Pt reports active participation in HEP. Pt also stated that she experience pain in her L quad over the weekend that resembled a muscle cramp.    Pertinent History Krystal Woodward is a 65 year old female with complaints of bilateral knee pain with the L knee occasionally buckling with walking, standing, and stair negotiation. Pt states her knee pain became more noticeable on May 21st after standing from a chair. Pt reports her pain today is 3-4/10 but increases and becomes more sharp with previously stated activities. Pt lives in a two story home with stairs to enter and access the 2nd floor. Pt reports having one fall in last six months without neccessary hospitilization. Patient denies any numbness or tingling and any red flags that would prevent physical therapy. Pt has previously had therapy for RCR and was previously given exercises to address knee pain. 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 Pt would like to perform ADLs and return to weight lifting with less pain. Pt reports being allergic to latex.    Limitations Standing;Lifting;Walking    Diagnostic tests XRAY  AP and lateral of the bilateral knees dated today demonstrate mild patellofemoral narrowing. The medial and lateral compartments appear normal. (Aluisio    Patient Stated Goals Patient would like to return to lifting weights and perform stair negotiation with less pain.                Therapeutic Exercise   Nu Step L2 x LE only Seat 7 Omega Leg  Extension #5 2 x10 reps cueing to not fully extend [employer] 2  x 10 reps tp hold and pause at the end Monster walks (GTB)1 x 20 ft L<>R Cueing to keep constant tension in band  10 MWT: 10sec = 0.71m/s comfortable speed 5XSTS: 24.22sec with excessive trunk flexion  Review of HEP with demonstrated and verbalized understanding                    PT Education - 10/11/20 1004     Education Details Patient educated on nature of PFPS and the approach of physical therapy treatment. Pt also educated on there ex/ technique    Person(s) Educated Patient    Methods Explanation;Demonstration    Comprehension Verbalized understanding;Returned demonstration              PT Short Term Goals - 10/06/20 1120       PT SHORT TERM GOAL #1   Title Patient will be independent with home exercise program to improve mobility and self manage symptoms.    Baseline Patient has previously received exercises but has not become independent.    Time 4    Period Weeks    Status New    Target Date 11/03/20               PT Long Term Goals - 10/11/20 1008       PT LONG TERM GOAL #1   Title Pt will be able to negotiate stairs utilizing reciprocal pattern with reports of pain no greater than 1/10 on NPRS in order to safely enter home and community buildings.    Baseline 10/06/2020: descends stairs with pain 4/10 utilizing lateral step-to pattern.    Time 8    Period Weeks    Status New      PT LONG TERM GOAL #2   Title Pt will report a score of 61 on FOTO in to demonstrate improvement in tolerance for functional acitivies.    Baseline 10/06/20: 42    Time 8    Period Weeks    Status New      PT LONG TERM GOAL #3   Title Patient will demonstrate gross lower extremity strength as 5/5 bilaterally in order to perform ADLs and return to prior exercise regimen.    Baseline 10/06/20: gross LE strength 4/4 bilaterally    Time 8    Period Weeks    Status New      PT LONG TERM GOAL  #4   Title Patient will perform 5XSTS within 15 seconds to demostrate increased LE strength and decrease risk of falling    Baseline 10/11/20 24.22 sec    Time 8    Period Weeks    Status New    Target Date 12/01/20      PT LONG TERM GOAL #5   Title Patient will increase comfortable gait  speed to 0.80 m/s in order to demonstrate improved ambulation for participation in the community ambulation.    Baseline 10/11/20 (0.6 m/s) self selected speed    Time 8    Period Weeks    Status New    Target Date 12/01/20      Additional Long Term Goals   Additional Long Term Goals Yes                   Plan - 10/11/20 1022     Clinical Impression Statement Patient tolerated session well evidenced by no increase in pain throughout session. Pt continues to demonstrate impairments that limit activities of daily living at home and in the community. PT completed and 5xsts outcome measure where patient shows deficits in each, with new goals written. Paitent is able to comply with all cuing for proper tecqhniue of therex with good motivation throughout session.  Continue PT POC to address LE weakness, activity intolerance, and decrease pain.    Personal Factors and Comorbidities Age;Fitness    Examination-Activity Limitations Locomotion Level;Lift;Stairs;Stand;Squat    Stability/Clinical Decision Making Stable/Uncomplicated    Clinical Decision Making Moderate    Rehab Potential Good    PT Frequency 2x / week    PT Duration 8 weeks    PT Treatment/Interventions Cryotherapy;Electrical Stimulation;Moist Heat;Traction;Gait training;Stair training;Functional mobility training;Therapeutic activities;Therapeutic exercise;Balance training;Neuromuscular re-education;Patient/family education;Manual techniques;Taping;Dry needling;Passive range of motion;Joint Manipulations    PT Next Visit Plan Initiate LE strengthening, LE mobility, and stair training    PT Home Exercise Plan mini squat, seated knee  ext, hip flex, hip abd    Consulted and Agree with Plan of Care Patient             Patient will benefit from skilled therapeutic intervention in order to improve the following deficits and impairments:  Decreased mobility, Decreased endurance, Difficulty walking, Decreased activity tolerance, Decreased strength, Pain, Abnormal gait, Decreased balance, Improper body mechanics, Postural dysfunction  Visit Diagnosis: Chronic pain of left knee  Chronic pain of right knee     Problem List Patient Active Problem List   Diagnosis Date Noted   Right shoulder pain 08/04/2020   Bilateral cataracts 01/20/2018   Post-menopausal 01/14/2016   Pre-diabetes 01/06/2015   Bilateral knee pain 01/04/2015   Overweight 01/04/2015   Hyperprolactinemia (HCC) 01/04/2015   Fatigue 01/04/2015   Family history of diabetes mellitus 01/04/2015   Family history of colon cancer 01/04/2015   MURMUR 06/30/2010   Hilda Lias DPT Romilda Joy, SPT  Hilda Lias 10/11/2020, 1:25 PM  [facility] [address] [place], [address] Phone: [phone]   Fax:  [phone]  Name: Krystal Woodward MRN: [id number] Date of Birth: 1956-01-17

## 2020-10-14 ENCOUNTER — Encounter: Payer: Self-pay | Admitting: Physical Therapy

## 2020-10-14 ENCOUNTER — Ambulatory Visit: Payer: No Typology Code available for payment source | Admitting: Physical Therapy

## 2020-10-14 DIAGNOSIS — M25562 Pain in left knee: Secondary | ICD-10-CM | POA: Diagnosis not present

## 2020-10-14 DIAGNOSIS — G8929 Other chronic pain: Secondary | ICD-10-CM

## 2020-10-14 NOTE — Therapy (Signed)
[facility] [address] [place], [address] Phone: [phone]   Fax:  [phone]  Physical Therapy Treatment  Patient Details  Name: Krystal Woodward MRN: [id number] Date of Birth: Apr 22, 1956 No data recorded  Encounter Date: 10/14/2020   PT End of Session - 10/14/20 0834     Visit Number 3    Number of Visits 17    Date for PT Re-Evaluation 11/03/20    PT Start Time 0828    PT Stop Time 0900    PT Time Calculation (min) 32 min    Activity Tolerance Patient tolerated treatment well    Behavior During Therapy [facility] for tasks assessed/performed             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   Breast mass 01/24/2011   Chest pain    Elevated liver enzymes    GERD (gastroesophageal reflux disease)    h/o   Hyperprolactinemia (HCC) 5277;8242   Normal MRI   Vitamin D deficiency     Past Surgical History:  Procedure Laterality Date   BREAST BIOPSY Right 06/17/15   Inflammed Fibrocyst   BREAST CYST EXCISION  02/27/2011   Procedure: CYST EXCISION BREAST;  Surgeon: Currie Paris, MD;  Location: [facility];  Service: General;  Laterality: Left;  Needle localization removal left breast mass   BREAST EXCISIONAL BIOPSY Left    BUNIONECTOMY     both feet   COLONOSCOPY WITH PROPOFOL N/A 09/10/2019   Procedure: COLONOSCOPY WITH PROPOFOL;  Surgeon: Toledo, Boykin Nearing, MD;  Location: [facility];  Service: Gastroenterology;  Laterality: N/A;   INCISION AND DRAINAGE ABSCESS Right 07/28/2015   Procedure: INCISION AND DRAINAGE RIGHT BREAST  ABSCESS;  Surgeon: Manus Rudd, MD;  Location:  [facility];  Service: General;  Laterality: Right;   SHOULDER ARTHROSCOPY WITH ROTATOR CUFF REPAIR AND OPEN BICEPS TENODESIS  10/10/2018   Procedure: SHOULDER ARTHROSCOPY WITH OPEN ROTATOR CUFF REPAIR AND MINI OPEN BICEPS TENODESIS;  Surgeon: Christena Flake, MD;  Location: [facility];  Service: Orthopedics;;   SHOULDER ARTHROSCOPY WITH SUBACROMIAL DECOMPRESSION  10/10/2018   Procedure: SHOULDER ARTHROSCOPY WITH SUBACROMIAL DECOMPRESSION;  Surgeon: Christena Flake, MD;  Location: [facility];  Service: Orthopedics;;   TUBAL LIGATION  1996    There were no vitals filed for this visit.   Subjective Assessment - 10/14/20 0828     Subjective Pt reports she is feeling better that she can feel a difference with therapeutic exercise (feeling stronger and easier getting up/down stairs). She reports pain 2.5/10 on NPRS.    Pertinent History Krystal Woodward is a 65 year old female with complaints of bilateral knee pain with the L knee occasionally buckling with walking, standing, and stair negotiation. Pt states her knee pain became more noticeable on May 21st after standing from a chair. Pt reports her pain today is 3-4/10 but increases and becomes more sharp with previously stated activities. Pt lives in a two story home with stairs to enter and access the 2nd floor. Pt reports having one fall in last six months without neccessary hospitilization. Patient denies any numbness or tingling and any red flags that would prevent physical therapy. Pt has previously had therapy for RCR and was previously given exercises to address knee pain. 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 Pt would like to perform ADLs and  return to weight lifting with less pain. Pt reports being allergic to latex.             Therapeutic Exercise:  Nu Step L4 x 5 min Seated knee ext 5# AW alternating 2 x 20 ea LE Monster Walks RTB D&B cues to bend knees Sit to stands (airex pad) 4 x 10 reps 1 kg ball in hand emphasis on leaning forward to exit chair and going down slowly to sit                           PT Education - 10/14/20 0904     Education Details Pt educated on therex technique    Person(s) Educated Patient    Methods  Explanation;Demonstration    Comprehension Verbalized understanding;Returned demonstration              PT Short Term Goals - 10/06/20 1120       PT SHORT TERM GOAL #1   Title Patient will be independent with home exercise program to improve mobility and self manage symptoms.    Baseline Patient has previously received exercises but has not become independent.    Time 4    Period Weeks    Status New    Target Date 11/03/20               PT Long Term Goals - 10/11/20 1008       PT LONG TERM GOAL #1   Title Pt will be able to negotiate stairs utilizing reciprocal pattern with reports of pain no greater than 1/10 on NPRS in order to safely enter home and community buildings.    Baseline 10/06/2020: descends stairs with pain 4/10 utilizing lateral step-to pattern.    Time 8    Period Weeks    Status New      PT LONG TERM GOAL #2   Title Pt will report a score of 61 on FOTO in to demonstrate improvement in tolerance for functional acitivies.    Baseline 10/06/20: 42    Time 8    Period Weeks    Status New      PT LONG TERM GOAL #3   Title Patient will demonstrate gross lower extremity strength as 5/5 bilaterally in order to perform ADLs and return to prior exercise regimen.    Baseline 10/06/20: gross LE strength 4/4 bilaterally    Time 8    Period Weeks    Status New      PT LONG TERM GOAL #4   Title Patient will perform 5XSTS within 15 seconds to demostrate increased LE strength and decrease risk of falling    Baseline 10/11/20 24.22 sec    Time 8    Period Weeks    Status New    Target Date 12/01/20      PT LONG TERM GOAL #5   Title Patient will increase comfortable gait speed to 0.80 m/s in order to demonstrate improved ambulation for participation in the community ambulation.    Baseline 10/11/20 (0.6 m/s) self selected speed    Time 8    Period Weeks    Status New    Target Date 12/01/20      Additional Long Term Goals   Additional Long Term Goals Yes                    Plan - 10/14/20 0905     Clinical Impression Statement Patient  tolerated session well evidenced by no increase of pain at end of session. PT consisted of LE strengthening and technique of sit<>stand out of a chair to increase muscle tension and strength. Continue PT POC to address impairments and achieve set goals.    PT Treatment/Interventions Cryotherapy;Electrical Stimulation;Moist Heat;Traction;Gait training;Stair training;Functional mobility training;Therapeutic activities;Therapeutic exercise;Balance training;Neuromuscular re-education;Patient/family education;Manual techniques;Taping;Dry needling;Passive range of motion;Joint Manipulations    PT Next Visit Plan Initiate LE strengthening, LE mobility, and stair training    PT Home Exercise Plan mini squat, seated knee ext, hip flex, hip abd             Patient will benefit from skilled therapeutic intervention in order to improve the following deficits and impairments:  Decreased mobility, Decreased endurance, Difficulty walking, Decreased activity tolerance, Decreased strength, Pain, Abnormal gait, Decreased balance, Improper body mechanics, Postural dysfunction  Visit Diagnosis: Chronic pain of left knee  Chronic pain of right knee     Problem List Patient Active Problem List   Diagnosis Date Noted   Right shoulder pain 08/04/2020   Bilateral cataracts 01/20/2018   Post-menopausal 01/14/2016   Pre-diabetes 01/06/2015   Bilateral knee pain 01/04/2015   Overweight 01/04/2015   Hyperprolactinemia (HCC) 01/04/2015   Fatigue 01/04/2015   Family history of diabetes mellitus 01/04/2015   Family history of colon cancer 01/04/2015   MURMUR 06/30/2010    Hilda Lias DPT Romilda Joy, SPT  Hilda Lias 10/14/2020, 1:29 PM  [facility] [address], [place], [address] Phone: [phone]   Fax:  [phone]  Name:  Jaylenne Hamelin MRN: [id number] Date of Birth: 03/25/1956

## 2020-10-20 ENCOUNTER — Other Ambulatory Visit: Payer: Self-pay

## 2020-10-20 ENCOUNTER — Encounter: Payer: Self-pay | Admitting: Physical Therapy

## 2020-10-20 ENCOUNTER — Ambulatory Visit: Payer: No Typology Code available for payment source | Admitting: Physical Therapy

## 2020-10-20 DIAGNOSIS — M25562 Pain in left knee: Secondary | ICD-10-CM | POA: Diagnosis not present

## 2020-10-20 DIAGNOSIS — G8929 Other chronic pain: Secondary | ICD-10-CM

## 2020-10-20 NOTE — Therapy (Signed)
[facility] [address], [place], [address] Phone: [phone]   Fax:  [phone]  Physical Therapy Treatment  Patient Details  Name: Krystal Woodward MRN: [id number] Date of Birth: 08-02-1955 No data recorded  Encounter Date: 10/20/2020   PT End of Session - 10/20/20 0859     Visit Number 4    Number of Visits 17    Date for PT Re-Evaluation 11/03/20    PT Start Time 0816    PT Stop Time 0859    PT Time Calculation (min) 43 min    Activity Tolerance Patient tolerated treatment well    Behavior During Therapy [facility] for tasks assessed/performed             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   Breast mass 01/24/2011   Chest pain    Elevated liver enzymes    GERD (gastroesophageal reflux disease)    h/o   Hyperprolactinemia (HCC) 7253;6644   Normal MRI   Vitamin D deficiency     Past Surgical History:  Procedure Laterality Date   BREAST BIOPSY Right 06/17/15   Inflammed Fibrocyst   BREAST CYST EXCISION  02/27/2011   Procedure: CYST EXCISION BREAST;  Surgeon: Currie Paris, MD;  Location: [facility];  Service: General;  Laterality: Left;  Needle localization removal left breast mass   BREAST EXCISIONAL BIOPSY Left    BUNIONECTOMY     both feet   COLONOSCOPY WITH PROPOFOL N/A 09/10/2019   Procedure: COLONOSCOPY WITH PROPOFOL;  Surgeon: Toledo, Boykin Nearing, MD;  Location: [facility];  Service: Gastroenterology;  Laterality: N/A;   INCISION AND DRAINAGE ABSCESS Right 07/28/2015   Procedure: INCISION AND DRAINAGE RIGHT BREAST  ABSCESS;  Surgeon: Manus Rudd, MD;  Location: [facility];  Service: General;  Laterality: Right;   SHOULDER ARTHROSCOPY WITH ROTATOR CUFF REPAIR AND OPEN BICEPS TENODESIS  10/10/2018   Procedure: SHOULDER ARTHROSCOPY WITH OPEN ROTATOR CUFF REPAIR AND MINI OPEN BICEPS TENODESIS;  Surgeon: Christena Flake, MD;  Location: [facility];  Service: Orthopedics;;   SHOULDER ARTHROSCOPY WITH SUBACROMIAL DECOMPRESSION  10/10/2018   Procedure: SHOULDER ARTHROSCOPY WITH SUBACROMIAL DECOMPRESSION;  Surgeon: Christena Flake, MD;  Location: [facility];  Service: Orthopedics;;   TUBAL LIGATION  1996    There were no vitals filed for this visit.   Subjective Assessment - 10/20/20 0819     Subjective Pt reports she is actively participating in her HEP. Pt reports her knee pain as 2.5/10 on NPRS. She reports having difficulty transfering from sit>stand from the toilet.    Pertinent History Krystal Woodward is a 65 year old female with complaints of bilateral knee pain with the L knee occasionally buckling with walking, standing, and stair negotiation. Pt states her knee pain became more noticeable on May 21st after standing from a chair. Pt reports her pain today is 3-4/10 but increases and becomes more sharp with previously stated activities. Pt lives in a two story home with stairs to enter and access the 2nd floor. Pt reports having one fall in last six months without neccessary hospitilization. Patient denies any numbness or tingling and any red flags that would prevent physical therapy. Pt has previously had therapy for RCR and was previously given exercises to address knee pain. 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 Pt would like to perform ADLs and  return to weight lifting with less pain. Pt reports being allergic to latex.    Limitations Standing;Lifting;Walking    Diagnostic tests XRAY  AP and lateral of the bilateral knees dated today demonstrate mild patellofemoral narrowing. The medial and lateral compartments appear normal. (Aluisio    Patient Stated Goals Patient would like to return to lifting weights and perform stair negotiation with less pain.                Therapeutic Exercise:   Nu Step L4 x 6 min >50 SPM Glute bridges 3 x10 reps #8DB, bosu  TRX squat 3 x 12 reps  (GTB above knee) cues to bend at the knees not waist. Seated knee ext 5# AW alternating 2 x 20 ea LE cueing to scoot forward in chair and extend knee fully  Standing hamstring curl 5# 1 x 90 sec alternating LE Standing marches 5 # 1 x 90 sec alternating LE Sit to stands (airex pad) 1 x 12  leaning forward to exit chair and going down slowly to sit. Cueing to limit rocking                        PT Education - 10/20/20 0929     Education Details Pt educated in therex form/ technique.    Person(s) Educated Patient    Methods Explanation;Demonstration;Tactile cues;Verbal cues    Comprehension Verbalized understanding;Returned demonstration;Verbal cues required;Tactile cues required              PT Short Term Goals - 10/06/20 1120       PT SHORT TERM GOAL #1   Title Patient will be independent with home exercise program to improve mobility and self manage symptoms.    Baseline Patient has previously received exercises but has not become independent.    Time 4    Period Weeks    Status New    Target Date 11/03/20               PT Long Term Goals - 10/11/20 1008       PT LONG TERM GOAL #1   Title Pt will be able to negotiate stairs utilizing reciprocal pattern with reports of pain no greater than 1/10 on NPRS in order to safely enter home and community buildings.    Baseline 10/06/2020: descends stairs with pain 4/10 utilizing lateral step-to pattern.    Time 8    Period Weeks    Status New      PT LONG TERM GOAL #2   Title Pt will report a score of 61 on FOTO in to demonstrate improvement in tolerance for functional acitivies.    Baseline 10/06/20: 42    Time 8    Period Weeks    Status New      PT LONG TERM GOAL #3   Title Patient will demonstrate gross lower extremity strength as 5/5 bilaterally in order to perform ADLs and return to prior exercise regimen.    Baseline 10/06/20: gross LE strength 4/4 bilaterally    Time 8    Period Weeks     Status New      PT LONG TERM GOAL #4   Title Patient will perform 5XSTS within 15 seconds to demostrate increased LE strength and decrease risk of falling    Baseline 10/11/20 24.22 sec    Time 8    Period Weeks    Status New    Target Date 12/01/20  PT LONG TERM GOAL #5   Title Patient will increase comfortable gait speed to 0.80 m/s in order to demonstrate improved ambulation for participation in the community ambulation.    Baseline 10/11/20 (0.6 m/s) self selected speed    Time 8    Period Weeks    Status New    Target Date 12/01/20      Additional Long Term Goals   Additional Long Term Goals Yes                   Plan - 10/20/20 0926     Clinical Impression Statement Pt tolerated session well evidenced by no increase in pain at end of session. Pt continues to demonstrated decreased LE strength, pain with activity, and some biomechanical carry over. PT utilized multimodal cues to correct form and ensure proper, safe therex progression. Continue PT POC as able to address impairment to achieve set goals.    Personal Factors and Comorbidities Age;Fitness    Examination-Activity Limitations Locomotion Level;Lift;Stairs;Stand;Squat    Examination-Participation Restrictions Psychologist, forensic    PT Treatment/Interventions Cryotherapy;Electrical Stimulation;Moist Heat;Traction;Gait training;Stair training;Functional mobility training;Therapeutic activities;Therapeutic exercise;Balance training;Neuromuscular re-education;Patient/family education;Manual techniques;Taping;Dry needling;Passive range of motion;Joint Manipulations    PT Next Visit Plan Initiate LE strengthening, LE mobility, and stair training    PT Home Exercise Plan mini squat, seated knee ext, hip flex, hip abd             Patient will benefit from skilled therapeutic intervention in order to improve the following deficits and impairments:  Decreased mobility, Decreased endurance, Difficulty  walking, Decreased activity tolerance, Decreased strength, Pain, Abnormal gait, Decreased balance, Improper body mechanics, Postural dysfunction  Visit Diagnosis: Chronic pain of left knee  Chronic pain of right knee     Problem List Patient Active Problem List   Diagnosis Date Noted   Right shoulder pain 08/04/2020   Bilateral cataracts 01/20/2018   Post-menopausal 01/14/2016   Pre-diabetes 01/06/2015   Bilateral knee pain 01/04/2015   Overweight 01/04/2015   Hyperprolactinemia (HCC) 01/04/2015   Fatigue 01/04/2015   Family history of diabetes mellitus 01/04/2015   Family history of colon cancer 01/04/2015   MURMUR 06/30/2010     Hilda Lias DPT Romilda Joy, SPT  Hilda Lias 10/20/2020, 10:13 AM  [facility] [address] [place], [address] Phone: [phone]   Fax:  [phone]  Name: Krystal Woodward MRN: [id number] Date of Birth: 1955/06/06

## 2020-10-22 ENCOUNTER — Telehealth: Payer: Self-pay | Admitting: Family Medicine

## 2020-10-22 DIAGNOSIS — H579 Unspecified disorder of eye and adnexa: Secondary | ICD-10-CM

## 2020-10-22 NOTE — Telephone Encounter (Signed)
I spoke with Ms. Roads.  Her last visit with [facility] stated she has increased pressures in her eye.  They recommended she be seen by Dr. Sherryll Burger at [facility].   Thanks,   -Vernona Rieger

## 2020-10-22 NOTE — Telephone Encounter (Signed)
Pt is calling to request a referral for Dr. Sherryll Burger with [facility] on [facility] [place], [place]. [phone]

## 2020-10-28 ENCOUNTER — Ambulatory Visit: Payer: No Typology Code available for payment source | Attending: Orthopedic Surgery | Admitting: Physical Therapy

## 2020-10-28 ENCOUNTER — Other Ambulatory Visit: Payer: Self-pay

## 2020-10-28 ENCOUNTER — Encounter: Payer: Self-pay | Admitting: Physical Therapy

## 2020-10-28 DIAGNOSIS — G8929 Other chronic pain: Secondary | ICD-10-CM | POA: Diagnosis present

## 2020-10-28 DIAGNOSIS — M25561 Pain in right knee: Secondary | ICD-10-CM | POA: Insufficient documentation

## 2020-10-28 DIAGNOSIS — M25562 Pain in left knee: Secondary | ICD-10-CM | POA: Insufficient documentation

## 2020-10-28 NOTE — Therapy (Signed)
[facility] [address], [place], [address] Phone: [phone]   Fax:  [phone]  Physical Therapy Treatment  Patient Details  Name: Krystal Woodward MRN: [id number] Date of Birth: 06/27/1955 No data recorded  Encounter Date: 10/28/2020   PT End of Session - 10/28/20 0959     Visit Number 5    Number of Visits 17    Date for PT Re-Evaluation 11/03/20    PT Start Time 0902    PT Stop Time 0945    PT Time Calculation (min) 43 min    Activity Tolerance Patient tolerated treatment well    Behavior During Therapy [facility] for tasks assessed/performed             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   Breast mass 01/24/2011   Chest pain    Elevated liver enzymes    GERD (gastroesophageal reflux disease)    h/o   Hyperprolactinemia (HCC) 5009;[id number]   Normal MRI   Vitamin D deficiency     Past Surgical History:  Procedure Laterality Date   BREAST BIOPSY Right 06/17/15   Inflammed Fibrocyst   BREAST CYST EXCISION  02/27/2011   Procedure: CYST EXCISION BREAST;  Surgeon: Currie Paris, MD;  Location: [facility];  Service: General;  Laterality: Left;  Needle localization removal left breast mass   BREAST EXCISIONAL BIOPSY Left    BUNIONECTOMY     both feet   COLONOSCOPY WITH PROPOFOL N/A 09/10/2019   Procedure: COLONOSCOPY WITH PROPOFOL;  Surgeon: Toledo, Boykin Nearing, MD;  Location: [facility];  Service: Gastroenterology;  Laterality: N/A;   INCISION AND DRAINAGE ABSCESS Right 07/28/2015   Procedure: INCISION AND DRAINAGE RIGHT BREAST  ABSCESS;  Surgeon: Manus Rudd, MD;  Location: [facility];  Service: General;  Laterality: Right;   SHOULDER ARTHROSCOPY WITH ROTATOR CUFF REPAIR AND OPEN BICEPS TENODESIS  10/10/2018   Procedure: SHOULDER ARTHROSCOPY WITH OPEN ROTATOR CUFF REPAIR AND MINI OPEN BICEPS TENODESIS;  Surgeon: Christena Flake, MD;  Location: [facility];  Service: Orthopedics;;   SHOULDER ARTHROSCOPY WITH SUBACROMIAL DECOMPRESSION  10/10/2018   Procedure: SHOULDER ARTHROSCOPY WITH SUBACROMIAL DECOMPRESSION;  Surgeon: Christena Flake, MD;  Location: [facility];  Service: Orthopedics;;   TUBAL LIGATION  1996    There were no vitals filed for this visit.   Subjective Assessment - 10/28/20 0905     Subjective Pt reports to therapy wearing her ACE knee band braces (bilat knees) today stating she has had pain and concerns of her L knee is giving way. She reports her pain today is 2/10 today but has been a 7/10 the past few days.    Pertinent History Krystal Woodward is a 65 year old female with complaints of bilateral knee pain with the L knee occasionally buckling with walking, standing, and stair negotiation. Pt states her knee pain became more noticeable on May 21st after standing from a chair. Pt reports her pain today is 3-4/10 but increases and becomes more sharp with previously stated activities. Pt lives in a two story home with stairs to enter and access the 2nd floor. Pt reports having one fall in last six months without neccessary hospitilization. Patient denies any numbness or tingling and any red flags that would prevent physical therapy. Pt has previously had therapy for RCR and was previously given exercises to address knee pain. 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 Pt would like to perform ADLs and return to weight lifting with less pain. Pt reports being allergic to latex.    Diagnostic tests XRAY  AP and lateral of the bilateral knees dated today demonstrate mild patellofemoral narrowing. The medial and lateral compartments appear normal. (Aluisio    Patient Stated Goals Patient would like to return to lifting weights and perform stair negotiation with less pain.              Therex  Nu Step L4 x 6 min >50 SPM   Squats 3 x 10 reps 10# held at chest cues to bend at the knees not  waist.  STS 2 x 10 reps without UE use  Walking lunges 4 x 20 ft with cues to bring knee forward up to toe and back knee down towards floor.  Seated knee ext 7.5# AW alternating 2 x 20 ea LE cueing extend knee fully and tighten quad and hold 3 sec and slowly lower.   KT tape was applied to bilateral knees in C shaped pattern from medial to lateral to decrease pain with exercise and improve patellar tracking.   *Patient was educated on removal and adverse effects of taping. Patient educated to remove taping by rolling off and may remove tape at any time but can last 2-5 days. Patient verbalized understanding.                       PT Education - 10/28/20 1010     Education Details Pt educated on importance of consistency with HEP.    Person(s) Educated Patient    Methods Explanation    Comprehension Verbalized understanding              PT Short Term Goals - 10/06/20 1120       PT SHORT TERM GOAL #1   Title Patient will be independent with home exercise program to improve mobility and self manage symptoms.    Baseline Patient has previously received exercises but has not become independent.    Time 4    Period Weeks    Status New    Target Date 11/03/20               PT Long Term Goals - 10/11/20 1008       PT LONG TERM GOAL #1   Title Pt will be able to negotiate stairs utilizing reciprocal pattern with reports of pain no greater than 1/10 on NPRS in order to safely enter home and community buildings.    Baseline 10/06/2020: descends stairs with pain 4/10 utilizing lateral step-to pattern.    Time 8    Period Weeks    Status New      PT LONG TERM GOAL #2   Title Pt will report a score of 61 on FOTO in to demonstrate improvement in tolerance for functional acitivies.    Baseline 10/06/20: 42    Time 8    Period Weeks    Status New      PT LONG TERM GOAL #3   Title Patient will demonstrate gross lower extremity strength as 5/5 bilaterally  in order to perform ADLs and return to prior exercise regimen.    Baseline 10/06/20: gross LE strength 4/4 bilaterally    Time 8    Period Weeks    Status New      PT LONG TERM GOAL #4   Title Patient  will perform 5XSTS within 15 seconds to demostrate increased LE strength and decrease risk of falling    Baseline 10/11/20 24.22 sec    Time 8    Period Weeks    Status New    Target Date 12/01/20      PT LONG TERM GOAL #5   Title Patient will increase comfortable gait speed to 0.80 m/s in order to demonstrate improved ambulation for participation in the community ambulation.    Baseline 10/11/20 (0.6 m/s) self selected speed    Time 8    Period Weeks    Status New    Target Date 12/01/20      Additional Long Term Goals   Additional Long Term Goals Yes                   Plan - 10/28/20 1006     Clinical Impression Statement Pt tolerated session well evidenced by decreased pain with open/closed chain exercises following KT taping. Pt continues to demonstrate decreased LE strength, decreased activity tolerance, and pain. PT utilized multimodal cues to ensure safe, proper form with therex. Continue PT POC as able.    Personal Factors and Comorbidities Age;Fitness    Examination-Activity Limitations Locomotion Level;Lift;Stairs;Stand;Squat    Examination-Participation Restrictions Psychologist, forensic    PT Treatment/Interventions Cryotherapy;Electrical Stimulation;Moist Heat;Traction;Gait training;Stair training;Functional mobility training;Therapeutic activities;Therapeutic exercise;Balance training;Neuromuscular re-education;Patient/family education;Manual techniques;Taping;Dry needling;Passive range of motion;Joint Manipulations    PT Next Visit Plan Initiate LE strengthening, LE mobility, and stair training    PT Home Exercise Plan mini squat, seated knee ext, hip flex, hip abd             Patient will benefit from skilled therapeutic intervention in order to  improve the following deficits and impairments:  Decreased mobility, Decreased endurance, Difficulty walking, Decreased activity tolerance, Decreased strength, Pain, Abnormal gait, Decreased balance, Improper body mechanics, Postural dysfunction  Visit Diagnosis: Chronic pain of left knee  Chronic pain of right knee     Problem List Patient Active Problem List   Diagnosis Date Noted   Right shoulder pain 08/04/2020   Bilateral cataracts 01/20/2018   Post-menopausal 01/14/2016   Pre-diabetes 01/06/2015   Bilateral knee pain 01/04/2015   Overweight 01/04/2015   Hyperprolactinemia (HCC) 01/04/2015   Fatigue 01/04/2015   Family history of diabetes mellitus 01/04/2015   Family history of colon cancer 01/04/2015   MURMUR 06/30/2010    Krystal Woodward DPT Krystal Woodward, SPT  Krystal Woodward 10/28/2020, 2:11 PM  [facility] [address], [place], [address] Phone: [phone]   Fax:  [phone]  Name: Krystal Woodward MRN: [id number] Date of Birth: 06/11/1955

## 2020-11-01 ENCOUNTER — Ambulatory Visit: Payer: No Typology Code available for payment source | Admitting: Physical Therapy

## 2020-11-01 ENCOUNTER — Encounter: Payer: Self-pay | Admitting: Physical Therapy

## 2020-11-01 DIAGNOSIS — M25561 Pain in right knee: Secondary | ICD-10-CM

## 2020-11-01 DIAGNOSIS — M25562 Pain in left knee: Secondary | ICD-10-CM | POA: Diagnosis not present

## 2020-11-01 DIAGNOSIS — G8929 Other chronic pain: Secondary | ICD-10-CM

## 2020-11-01 NOTE — Therapy (Signed)
[facility] [address], [place], [address] Phone: [phone]   Fax:  [phone]  Physical Therapy Treatment  Patient Details  Name: Krystal Woodward MRN: [id number] Date of Birth: Mar 03, 1956 No data recorded  Encounter Date: 11/01/2020   PT End of Session - 11/01/20 0916     Visit Number 6    Number of Visits 17    Date for PT Re-Evaluation 11/03/20    PT Start Time 0902    PT Stop Time 0945    PT Time Calculation (min) 43 min    Activity Tolerance Patient tolerated treatment well    Behavior During Therapy [facility] for tasks assessed/performed             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   Breast mass 01/24/2011   Chest pain    Elevated liver enzymes    GERD (gastroesophageal reflux disease)    h/o   Hyperprolactinemia (HCC) [id number];[id number]   Normal MRI   Vitamin D deficiency     Past Surgical History:  Procedure Laterality Date   BREAST BIOPSY Right 06/17/15   Inflammed Fibrocyst   BREAST CYST EXCISION  02/27/2011   Procedure: CYST EXCISION BREAST;  Surgeon: Currie Paris, MD;  Location: [facility];  Service: General;  Laterality: Left;  Needle localization removal left breast mass   BREAST EXCISIONAL BIOPSY Left    BUNIONECTOMY     both feet   COLONOSCOPY WITH PROPOFOL N/A 09/10/2019   Procedure: COLONOSCOPY WITH PROPOFOL;  Surgeon: Toledo, Boykin Nearing, MD;  Location: [facility];  Service: Gastroenterology;  Laterality: N/A;   INCISION AND DRAINAGE ABSCESS Right 07/28/2015   Procedure: INCISION AND DRAINAGE RIGHT BREAST  ABSCESS;  Surgeon: Manus Rudd, MD;  Location: [facility];  Service: General;  Laterality: Right;   SHOULDER ARTHROSCOPY WITH ROTATOR CUFF REPAIR AND OPEN BICEPS TENODESIS  10/10/2018   Procedure: SHOULDER ARTHROSCOPY WITH OPEN ROTATOR CUFF REPAIR AND MINI OPEN BICEPS TENODESIS;  Surgeon: Christena Flake, MD;  Location: [facility];  Service: Orthopedics;;   SHOULDER ARTHROSCOPY WITH SUBACROMIAL DECOMPRESSION  10/10/2018   Procedure: SHOULDER ARTHROSCOPY WITH SUBACROMIAL DECOMPRESSION;  Surgeon: Christena Flake, MD;  Location: [facility];  Service: Orthopedics;;   TUBAL LIGATION  1996    There were no vitals filed for this visit.   Subjective Assessment - 11/01/20 0910     Subjective Pt reports feeling well today having only a little pain in both knee rating 2/1o on NPRS. She reports the taping lasted about a day and that she did not noticed the tape helping much after the session.    Pertinent History Krystal Woodward is a 65 year old female with complaints of bilateral knee pain with the L knee occasionally buckling with walking, standing, and stair negotiation. Pt states her knee pain became more noticeable on May 21st after standing from a chair. Pt reports her pain today is 3-4/10 but increases and becomes more sharp with previously stated activities. Pt lives in a two story home with stairs to enter and access the 2nd floor. Pt reports having one fall in last six months without neccessary hospitilization. Patient denies any numbness or tingling and any red flags that would prevent physical therapy. Pt has previously had therapy for RCR and was previously given exercises to address knee pain. 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 Pt would like to perform ADLs and return to weight lifting with less pain. Pt reports being allergic to latex.                  Therex  Nustep LE only L4 x 6 min   Leg press 1 x 20 reps 55#, 1 x 30 75#, 1 x 20 85# cues to not lock knees out on full extension  Mini squats to airex pan in green chair 1 x  10 with 10# DB, 1 x 10 reps with airex in chair, 1 x 10 with small airex in chair. Cues to not bend at the waist and bend more with knees  FWD/LAT/BWS eccentric step downs on 4" step 1 x 10 reps L&R cues to land on heel fwd/lat and on  toe bwd   Seated Knee extension 1 x 25 reps bilat with 2-3 sec hold verbal cues fully extend                      PT Education - 11/01/20 0954     Education Details Pt educated on biomechanics and corrected motor patterns.    Person(s) Educated Patient    Methods Explanation;Demonstration    Comprehension Verbalized understanding;Returned demonstration              PT Short Term Goals - 10/06/20 1120       PT SHORT TERM GOAL #1   Title Patient will be independent with home exercise program to improve mobility and self manage symptoms.    Baseline Patient has previously received exercises but has not become independent.    Time 4    Period Weeks    Status New    Target Date 11/03/20               PT Long Term Goals - 10/11/20 1008       PT LONG TERM GOAL #1   Title Pt will be able to negotiate stairs utilizing reciprocal pattern with reports of pain no greater than 1/10 on NPRS in order to safely enter home and community buildings.    Baseline 10/06/2020: descends stairs with pain 4/10 utilizing lateral step-to pattern.    Time 8    Period Weeks    Status New      PT LONG TERM GOAL #2   Title Pt will report a score of 61 on FOTO in to demonstrate improvement in tolerance for functional acitivies.    Baseline 10/06/20: 42    Time 8    Period Weeks    Status New      PT LONG TERM GOAL #3   Title Patient will demonstrate gross lower extremity strength as 5/5 bilaterally in order to perform ADLs and return to prior exercise regimen.    Baseline 10/06/20: gross LE strength 4/4 bilaterally    Time 8    Period Weeks    Status New      PT LONG TERM GOAL #4   Title Patient will perform 5XSTS within 15 seconds to demostrate increased LE strength and decrease risk of falling    Baseline 10/11/20 24.22 sec    Time 8    Period Weeks    Status New    Target Date 12/01/20      PT LONG TERM GOAL #5   Title Patient will increase comfortable gait speed  to 0.80 m/s in order to demonstrate improved ambulation for participation in the community ambulation.  Baseline 10/11/20 (0.6 m/s) self selected speed    Time 8    Period Weeks    Status New    Target Date 12/01/20      Additional Long Term Goals   Additional Long Term Goals Yes                   Plan - 11/01/20 0949     Clinical Impression Statement Pt tolerated session well evidenced by good carry over with squatting mechanics at end of session. Pt will continue to benefit from skilled therapy to improve motor patterns, increase LE strength, and increase activity tolerance. PT utilized mulitmodal cueing to ensure safe, proper form with therex. Continue therex progression as able.    Personal Factors and Comorbidities Age;Fitness    Examination-Activity Limitations Locomotion Level;Lift;Stairs;Stand;Squat    Examination-Participation Restrictions Psychologist, forensic    PT Treatment/Interventions Cryotherapy;Electrical Stimulation;Moist Heat;Traction;Gait training;Stair training;Functional mobility training;Therapeutic activities;Therapeutic exercise;Balance training;Neuromuscular re-education;Patient/family education;Manual techniques;Taping;Dry needling;Passive range of motion;Joint Manipulations    PT Next Visit Plan Continue LE strengthening, LE mobility, and stair training    PT Home Exercise Plan mini squat, seated knee ext, hip flex, hip abd             Patient will benefit from skilled therapeutic intervention in order to improve the following deficits and impairments:  Decreased mobility, Decreased endurance, Difficulty walking, Decreased activity tolerance, Decreased strength, Pain, Abnormal gait, Decreased balance, Improper body mechanics, Postural dysfunction  Visit Diagnosis: Chronic pain of left knee  Chronic pain of right knee     Problem List Patient Active Problem List   Diagnosis Date Noted   Right shoulder pain 08/04/2020   Bilateral  cataracts 01/20/2018   Post-menopausal 01/14/2016   Pre-diabetes 01/06/2015   Bilateral knee pain 01/04/2015   Overweight 01/04/2015   Hyperprolactinemia (HCC) 01/04/2015   Fatigue 01/04/2015   Family history of diabetes mellitus 01/04/2015   Family history of colon cancer 01/04/2015   MURMUR 06/30/2010    Hilda Lias DPT Romilda Joy, SPT  Hilda Lias 11/01/2020, 11:06 AM  [facility] [address] [place], [address] Phone: [phone]   Fax:  [phone]  Name: Krystal Woodward MRN: [id number] Date of Birth: 06/27/1955

## 2020-11-03 ENCOUNTER — Encounter: Payer: Self-pay | Admitting: Physical Therapy

## 2020-11-03 ENCOUNTER — Ambulatory Visit: Payer: No Typology Code available for payment source | Admitting: Physical Therapy

## 2020-11-03 DIAGNOSIS — M25562 Pain in left knee: Secondary | ICD-10-CM | POA: Diagnosis not present

## 2020-11-03 DIAGNOSIS — G8929 Other chronic pain: Secondary | ICD-10-CM

## 2020-11-03 DIAGNOSIS — M25561 Pain in right knee: Secondary | ICD-10-CM

## 2020-11-03 NOTE — Therapy (Signed)
[facility] [address] [place], [address] Phone: [phone]   Fax:  [phone]  Physical Therapy Treatment  Patient Details  Name: Krystal Woodward MRN: [id number] Date of Birth: 1955-07-04 No data recorded  Encounter Date: 11/03/2020   PT End of Session - 11/03/20 1040     Visit Number 7    Number of Visits 17    Date for PT Re-Evaluation 11/03/20    PT Start Time 0948    PT Stop Time 1030    PT Time Calculation (min) 42 min    Activity Tolerance Patient tolerated treatment well    Behavior During Therapy [facility] for tasks assessed/performed             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   Breast mass 01/24/2011   Chest pain    Elevated liver enzymes    GERD (gastroesophageal reflux disease)    h/o   Hyperprolactinemia (HCC) 7169;6789   Normal MRI   Vitamin D deficiency     Past Surgical History:  Procedure Laterality Date   BREAST BIOPSY Right 06/17/15   Inflammed Fibrocyst   BREAST CYST EXCISION  02/27/2011   Procedure: CYST EXCISION BREAST;  Surgeon: Currie Paris, MD;  Location: [facility];  Service: General;  Laterality: Left;  Needle localization removal left breast mass   BREAST EXCISIONAL BIOPSY Left    BUNIONECTOMY     both feet   COLONOSCOPY WITH PROPOFOL N/A 09/10/2019   Procedure: COLONOSCOPY WITH PROPOFOL;  Surgeon: Toledo, Boykin Nearing, MD;  Location: [facility];  Service: Gastroenterology;  Laterality: N/A;   INCISION AND DRAINAGE ABSCESS Right 07/28/2015   Procedure: INCISION AND DRAINAGE RIGHT BREAST  ABSCESS;  Surgeon: Manus Rudd, MD;  Location: [facility];  Service: General;  Laterality: Right;   SHOULDER ARTHROSCOPY WITH ROTATOR CUFF REPAIR AND OPEN BICEPS TENODESIS  10/10/2018   Procedure: SHOULDER ARTHROSCOPY WITH OPEN ROTATOR CUFF REPAIR AND MINI OPEN BICEPS TENODESIS;  Surgeon: Christena Flake, MD;  Location: [facility];  Service: Orthopedics;;   SHOULDER ARTHROSCOPY WITH SUBACROMIAL DECOMPRESSION  10/10/2018   Procedure: SHOULDER ARTHROSCOPY WITH SUBACROMIAL DECOMPRESSION;  Surgeon: Christena Flake, MD;  Location: [facility];  Service: Orthopedics;;   TUBAL LIGATION  1996    There were no vitals filed for this visit.   Subjective Assessment - 11/03/20 0950     Subjective Pt reports trying improve her motor patterns with stair negotiation. She also reports being active in her HEP. She reports her pain as 0/10 pain on NPRS.    Pertinent History Krystal Woodward is a 65 year old female with complaints of bilateral knee pain with the L knee occasionally buckling with walking, standing, and stair negotiation. Pt states her knee pain became more noticeable on May 21st after standing from a chair. Pt reports her pain today is 3-4/10 but increases and becomes more sharp with previously stated activities. Pt lives in a two story home with stairs to enter and access the 2nd floor. Pt reports having one fall in last six months without neccessary hospitilization. Patient denies any numbness or tingling and any red flags that would prevent physical therapy. Pt has previously had therapy for RCR and was previously given exercises to address knee pain. 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 Pt would like to perform ADLs and return  to weight lifting with less pain. Pt reports being allergic to latex.    Limitations Standing;Lifting;Walking    How long can you sit comfortably? Unlimited    Diagnostic tests XRAY  AP and lateral of the bilateral knees dated today demonstrate mild patellofemoral narrowing. The medial and lateral compartments appear normal. (Aluisio    Patient Stated Goals Patient would like to return to lifting weights and perform stair negotiation with less pain.             Therex   Nu Step L4 x 5 min >50 SPM LE only  Glute bridges (GTB above knees) 3 x10  reps   Side Lying hip Abduction 3 x 10 reps 2# ankle wt each side with cueing to keep leg   TRX squat 2 x 12 reps (GTB above knee) cues to bend at the knees not waist.  Wall squats GTB above knees 1 x 12 reps, 1 x 10 reps with 10# DB (0-45 deg knee flexion)  Seated knee ext GTB alternating 2 x 10 ea LE cueing to scoot forward in chair and extend knee            PT Education - 11/03/20 1043     Education Details Pt educated on therex form/technique and postural corrections.    Person(s) Educated Patient    Methods Explanation;Demonstration    Comprehension Verbalized understanding;Returned demonstration              PT Short Term Goals - 10/06/20 1120       PT SHORT TERM GOAL #1   Title Patient will be independent with home exercise program to improve mobility and self manage symptoms.    Baseline Patient has previously received exercises but has not become independent.    Time 4    Period Weeks    Status New    Target Date 11/03/20               PT Long Term Goals - 10/11/20 1008       PT LONG TERM GOAL #1   Title Pt will be able to negotiate stairs utilizing reciprocal pattern with reports of pain no greater than 1/10 on NPRS in order to safely enter home and community buildings.    Baseline 10/06/2020: descends stairs with pain 4/10 utilizing lateral step-to pattern.    Time 8    Period Weeks    Status New      PT LONG TERM GOAL #2   Title Pt will report a score of 61 on FOTO in to demonstrate improvement in tolerance for functional acitivies.    Baseline 10/06/20: 42    Time 8    Period Weeks    Status New      PT LONG TERM GOAL #3   Title Patient will demonstrate gross lower extremity strength as 5/5 bilaterally in order to perform ADLs and return to prior exercise regimen.    Baseline 10/06/20: gross LE strength 4/4 bilaterally    Time 8    Period Weeks    Status New      PT LONG TERM GOAL #4   Title Patient will perform 5XSTS within 15  seconds to demostrate increased LE strength and decrease risk of falling    Baseline 10/11/20 24.22 sec    Time 8    Period Weeks    Status New    Target Date 12/01/20      PT LONG TERM GOAL #5   Title Patient  will increase comfortable gait speed to 0.80 m/s in order to demonstrate improved ambulation for participation in the community ambulation.    Baseline 10/11/20 (0.6 m/s) self selected speed    Time 8    Period Weeks    Status New    Target Date 12/01/20      Additional Long Term Goals   Additional Long Term Goals Yes                   Plan - 11/03/20 1043     Clinical Impression Statement Pt tolerated session well evidenced by no reports of increased knee pain following therex and descending stairs. PT session focused on LE strengthening utilizing both CKC and OKC exercises. PT utilized multimodal cues to ensure proper form and ensure safe exercise progression. Continue PT POC as able.    Personal Factors and Comorbidities Age;Fitness    Examination-Activity Limitations Locomotion Level;Lift;Stairs;Stand;Squat    Stability/Clinical Decision Making Stable/Uncomplicated    Rehab Potential Good    PT Frequency 2x / week    PT Duration 8 weeks    PT Treatment/Interventions Cryotherapy;Electrical Stimulation;Moist Heat;Traction;Gait training;Stair training;Functional mobility training;Therapeutic activities;Therapeutic exercise;Balance training;Neuromuscular re-education;Patient/family education;Manual techniques;Taping;Dry needling;Passive range of motion;Joint Manipulations    PT Next Visit Plan Continue LE strengthening, LE mobility, and stair training    PT Home Exercise Plan mini squat, seated knee ext, hip flex, hip abd    Consulted and Agree with Plan of Care Patient             Patient will benefit from skilled therapeutic intervention in order to improve the following deficits and impairments:  Decreased mobility, Decreased endurance, Difficulty walking,  Decreased activity tolerance, Decreased strength, Pain, Abnormal gait, Decreased balance, Improper body mechanics, Postural dysfunction  Visit Diagnosis: Chronic pain of left knee  Chronic pain of right knee     Problem List Patient Active Problem List   Diagnosis Date Noted   Right shoulder pain 08/04/2020   Bilateral cataracts 01/20/2018   Post-menopausal 01/14/2016   Pre-diabetes 01/06/2015   Bilateral knee pain 01/04/2015   Overweight 01/04/2015   Hyperprolactinemia (HCC) 01/04/2015   Fatigue 01/04/2015   Family history of diabetes mellitus 01/04/2015   Family history of colon cancer 01/04/2015   MURMUR 06/30/2010    Hilda Lias DPT Romilda Joy, SPT  Hilda Lias 11/03/2020, 1:49 PM  [place] [facility] [address] [place], [address] Phone: [phone]   Fax:  [phone]  Name: Krystal Woodward MRN: [id number] Date of Birth: November 30, 1955

## 2020-11-04 ENCOUNTER — Other Ambulatory Visit: Payer: Self-pay | Admitting: Family Medicine

## 2020-11-04 ENCOUNTER — Other Ambulatory Visit: Payer: Self-pay

## 2020-11-04 MED ORDER — AMPHETAMINE-DEXTROAMPHETAMINE 5 MG PO TABS
5.0000 mg | ORAL_TABLET | Freq: Two times a day (BID) | ORAL | 0 refills | Status: DC
  Filled 2020-11-04: qty 60, 30d supply, fill #0

## 2020-11-04 NOTE — Telephone Encounter (Signed)
Medication Refill - Medication: Adderall   Has the patient contacted their pharmacy? Yes.   Pt states that she contacted the pharmacy, but that they are needing her to use a [place] pharmacy due to insurance. Please advise.  (Agent: If no, request that the patient contact the pharmacy for the refill.) (Agent: If yes, when and what did the pharmacy advise?)  Preferred Pharmacy (with phone number or street name):  [facility]  [address] [place] [address]  Phone: [phone] Fax: [phone]  Hours: M-F 7:30a-5:30p    Agent: Please be advised that RX refills may take up to 3 business days. We ask that you follow-up with your pharmacy.

## 2020-11-04 NOTE — Telephone Encounter (Signed)
Requested medication (s) are due for refill today: yes  Requested medication (s) are on the active medication list: yes  Last refill:  08/23/2020  Future visit scheduled: no  Notes to clinic:  this refill cannot be delegated   Requested Prescriptions  Pending Prescriptions Disp Refills   amphetamine-dextroamphetamine (ADDERALL) 5 MG tablet 60 tablet 0    Sig: Take 1 tablet (5 mg total) by mouth 2 (two) times daily.      Not Delegated - Psychiatry:  Stimulants/ADHD Failed - 11/04/2020  2:16 PM      Failed - This refill cannot be delegated      Failed - Urine Drug Screen completed in last 360 days      Passed - Valid encounter within last 3 months    Recent Outpatient Visits           2 months ago Left flank pain   [facility] Malva Limes, MD   2 months ago Left foot pain   [facility] Maple Hudson., MD   3 months ago Annual physical exam   [facility] Malva Limes, MD   3 months ago Attention deficit disorder (ADD) without hyperactivity   [facility] Malva Limes, MD   4 months ago Chronic left-sided low back pain without sciatica   [facility] Osvaldo Angst M, [place]

## 2020-11-08 ENCOUNTER — Encounter: Payer: Self-pay | Admitting: Physical Therapy

## 2020-11-08 ENCOUNTER — Ambulatory Visit: Payer: No Typology Code available for payment source | Admitting: Physical Therapy

## 2020-11-08 DIAGNOSIS — M25561 Pain in right knee: Secondary | ICD-10-CM

## 2020-11-08 DIAGNOSIS — M25562 Pain in left knee: Secondary | ICD-10-CM | POA: Diagnosis not present

## 2020-11-08 DIAGNOSIS — G8929 Other chronic pain: Secondary | ICD-10-CM

## 2020-11-08 NOTE — Therapy (Signed)
[place] [facility] [address] [place], [address] Phone: [phone]   Fax:  [phone]  Physical Therapy Treatment  Patient Details  Name: Krystal Woodward MRN: [id number] Date of Birth: April 21, 1956 No data recorded  Encounter Date: 11/08/2020   PT End of Session - 11/08/20 0955     Visit Number 8    Number of Visits 17    Date for PT Re-Evaluation 11/03/20    PT Start Time 0905    PT Stop Time 0944    PT Time Calculation (min) 39 min    Activity Tolerance Patient tolerated treatment well    Behavior During Therapy [facility] for tasks assessed/performed             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   Breast mass 01/24/2011   Chest pain    Elevated liver enzymes    GERD (gastroesophageal reflux disease)    h/o   Hyperprolactinemia (HCC) 4081;[id number]   Normal MRI   Vitamin D deficiency     Past Surgical History:  Procedure Laterality Date   BREAST BIOPSY Right 06/17/15   Inflammed Fibrocyst   BREAST CYST EXCISION  02/27/2011   Procedure: CYST EXCISION BREAST;  Surgeon: Currie Paris, MD;  Location: [facility];  Service: General;  Laterality: Left;  Needle localization removal left breast mass   BREAST EXCISIONAL BIOPSY Left    BUNIONECTOMY     both feet   COLONOSCOPY WITH PROPOFOL N/A 09/10/2019   Procedure: COLONOSCOPY WITH PROPOFOL;  Surgeon: Toledo, Boykin Nearing, MD;  Location: [facility];  Service: Gastroenterology;  Laterality: N/A;   INCISION AND DRAINAGE ABSCESS Right 07/28/2015   Procedure: INCISION AND DRAINAGE RIGHT BREAST  ABSCESS;  Surgeon: Manus Rudd, MD;  Location: [facility];  Service: General;  Laterality: Right;   SHOULDER ARTHROSCOPY WITH ROTATOR CUFF REPAIR AND OPEN BICEPS TENODESIS  10/10/2018   Procedure: SHOULDER ARTHROSCOPY WITH OPEN ROTATOR CUFF REPAIR AND MINI OPEN BICEPS TENODESIS;  Surgeon: Christena Flake, MD;  Location: [facility];  Service: Orthopedics;;   SHOULDER ARTHROSCOPY WITH SUBACROMIAL DECOMPRESSION  10/10/2018   Procedure: SHOULDER ARTHROSCOPY WITH SUBACROMIAL DECOMPRESSION;  Surgeon: Christena Flake, MD;  Location: [facility];  Service: Orthopedics;;   TUBAL LIGATION  1996    There were no vitals filed for this visit.   Subjective Assessment - 11/08/20 0952     Subjective Pt reports trying improve her motor patterns with stair negotiation. She also reports being active in her HEP. She reports her pain as 0/10 pain on NPRS.    Pertinent History Mahina Salatino is a 65 year old female with complaints of bilateral knee pain with the L knee occasionally buckling with walking, standing, and stair negotiation. Pt states her knee pain became more noticeable on May 21st after standing from a chair. Pt reports her pain today is 3-4/10 but increases and becomes more sharp with previously stated activities. Pt lives in a two story home with stairs to enter and access the 2nd floor. Pt reports having one fall in last six months without neccessary hospitilization. Patient denies any numbness or tingling and any red flags that would prevent physical therapy. Pt has previously had therapy for RCR and was previously given exercises to address knee pain. 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 Pt would like to perform ADLs and return  to weight lifting with less pain. Pt reports being allergic to latex.    Limitations Standing;Lifting;Walking    How long can you sit comfortably? Unlimited    Diagnostic tests XRAY  AP and lateral of the bilateral knees dated today demonstrate mild patellofemoral narrowing. The medial and lateral compartments appear normal. (Aluisio    Pain Onset More than a month ago               Therex   Nu Step L4 x 6 min >60 SPM Seat 9 LE only  Glute bridges 3 x10 reps w/ GTB above knees cues to tighten core and buttocks  STS (GTB) 3 x 10 reps from mat  table 22 inches cues to bend at knees not waist. Cueing to limit rocking   TRX Sumo Squat 1 x 10 reps (GTB above knee) cues to bend at the knees not waist.  Seated knee ext 7.5# AW alternating 2 x 20 ea LE cueing to scoot forward in chair and extend knee fully                        PT Education - 11/08/20 0954     Education Details Pt educated on the importance of continuing HEP.    Person(s) Educated Patient    Methods Explanation;Demonstration    Comprehension Verbalized understanding;Returned demonstration              PT Short Term Goals - 10/06/20 1120       PT SHORT TERM GOAL #1   Title Patient will be independent with home exercise program to improve mobility and self manage symptoms.    Baseline Patient has previously received exercises but has not become independent.    Time 4    Period Weeks    Status New    Target Date 11/03/20               PT Long Term Goals - 10/11/20 1008       PT LONG TERM GOAL #1   Title Pt will be able to negotiate stairs utilizing reciprocal pattern with reports of pain no greater than 1/10 on NPRS in order to safely enter home and community buildings.    Baseline 10/06/2020: descends stairs with pain 4/10 utilizing lateral step-to pattern.    Time 8    Period Weeks    Status New      PT LONG TERM GOAL #2   Title Pt will report a score of 61 on FOTO in to demonstrate improvement in tolerance for functional acitivies.    Baseline 10/06/20: 42    Time 8    Period Weeks    Status New      PT LONG TERM GOAL #3   Title Patient will demonstrate gross lower extremity strength as 5/5 bilaterally in order to perform ADLs and return to prior exercise regimen.    Baseline 10/06/20: gross LE strength 4/4 bilaterally    Time 8    Period Weeks    Status New      PT LONG TERM GOAL #4   Title Patient will perform 5XSTS within 15 seconds to demostrate increased LE strength and decrease risk of falling    Baseline  10/11/20 24.22 sec    Time 8    Period Weeks    Status New    Target Date 12/01/20      PT LONG TERM GOAL #5   Title Patient will increase comfortable  gait speed to 0.80 m/s in order to demonstrate improved ambulation for participation in the community ambulation.    Baseline 10/11/20 (0.6 m/s) self selected speed    Time 8    Period Weeks    Status New    Target Date 12/01/20      Additional Long Term Goals   Additional Long Term Goals Yes                   Plan - 11/08/20 0957     Clinical Impression Statement Pt tolerated session well evidenced by no increase in bilateral knee pain following therex. PT session is continuing to focused on LE strengthening utilizing CKC and OKC exercises. PT will continue to provide multimodal cueing to ensure safe and effective exercise progression. Continue PT POC.    Personal Factors and Comorbidities Age;Fitness    Examination-Activity Limitations Locomotion Level;Lift;Stairs;Stand;Squat    Examination-Participation Restrictions Community Activity;Cleaning    Rehab Potential Good    PT Frequency 2x / week    PT Duration 8 weeks    PT Treatment/Interventions Cryotherapy;Electrical Stimulation;Moist Heat;Traction;Gait training;Stair training;Functional mobility training;Therapeutic activities;Therapeutic exercise;Balance training;Neuromuscular re-education;Patient/family education;Manual techniques;Taping;Dry needling;Passive range of motion;Joint Manipulations    PT Next Visit Plan Continue LE strengthening, LE mobility, and stair training. Try taping again next visit    PT Home Exercise Plan mini squat, seated knee ext, hip flex, hip abd    Consulted and Agree with Plan of Care Patient             Patient will benefit from skilled therapeutic intervention in order to improve the following deficits and impairments:  Decreased mobility, Decreased endurance, Difficulty walking, Decreased activity tolerance, Decreased strength, Pain,  Abnormal gait, Decreased balance, Improper body mechanics, Postural dysfunction  Visit Diagnosis: Chronic pain of left knee  Chronic pain of right knee     Problem List Patient Active Problem List   Diagnosis Date Noted   Right shoulder pain 08/04/2020   Bilateral cataracts 01/20/2018   Post-menopausal 01/14/2016   Pre-diabetes 01/06/2015   Bilateral knee pain 01/04/2015   Overweight 01/04/2015   Hyperprolactinemia (HCC) 01/04/2015   Fatigue 01/04/2015   Family history of diabetes mellitus 01/04/2015   Family history of colon cancer 01/04/2015   MURMUR 06/30/2010     Hilda Lias DPT Romilda Joy, SPT  Hilda Lias 11/08/2020, 10:19 AM  [place] [facility] [address] [place], [address] Phone: [phone]   Fax:  [phone]  Name: Krystal Woodward MRN: [id number] Date of Birth: 05/16/1955

## 2020-11-10 ENCOUNTER — Other Ambulatory Visit: Payer: Self-pay

## 2020-11-11 ENCOUNTER — Ambulatory Visit: Payer: No Typology Code available for payment source | Admitting: Physical Therapy

## 2020-11-11 ENCOUNTER — Encounter: Payer: Self-pay | Admitting: Physical Therapy

## 2020-11-11 DIAGNOSIS — M25562 Pain in left knee: Secondary | ICD-10-CM

## 2020-11-11 DIAGNOSIS — G8929 Other chronic pain: Secondary | ICD-10-CM

## 2020-11-11 NOTE — Therapy (Signed)
[facility] [address] [place], [address] Phone: [phone]   Fax:  [phone]  Physical Therapy Treatment  Patient Details  Name: Krystal Woodward MRN: [id number] Date of Birth: 12-25-1955 No data recorded  Encounter Date: 11/11/2020   PT End of Session - 11/11/20 0912     Visit Number 9    Number of Visits 17    Date for PT Re-Evaluation 11/03/20    PT Start Time 0905    PT Stop Time 0945    PT Time Calculation (min) 40 min    Activity Tolerance Patient tolerated treatment well    Behavior During Therapy [facility] for tasks assessed/performed             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   Breast mass 01/24/2011   Chest pain    Elevated liver enzymes    GERD (gastroesophageal reflux disease)    h/o   Hyperprolactinemia (HCC) 2725;3664   Normal MRI   Vitamin D deficiency     Past Surgical History:  Procedure Laterality Date   BREAST BIOPSY Right 06/17/15   Inflammed Fibrocyst   BREAST CYST EXCISION  02/27/2011   Procedure: CYST EXCISION BREAST;  Surgeon: Currie Paris, MD;  Location: [facility];  Service: General;  Laterality: Left;  Needle localization removal left breast mass   BREAST EXCISIONAL BIOPSY Left    BUNIONECTOMY     both feet   COLONOSCOPY WITH PROPOFOL N/A 09/10/2019   Procedure: COLONOSCOPY WITH PROPOFOL;  Surgeon: Toledo, Boykin Nearing, MD;  Location: [facility];  Service: Gastroenterology;  Laterality: N/A;   INCISION AND DRAINAGE ABSCESS Right 07/28/2015   Procedure: INCISION AND DRAINAGE RIGHT BREAST  ABSCESS;  Surgeon: Manus Rudd, MD;  Location: [facility];  Service: General;  Laterality: Right;   SHOULDER ARTHROSCOPY WITH ROTATOR CUFF REPAIR AND OPEN BICEPS TENODESIS  10/10/2018   Procedure: SHOULDER ARTHROSCOPY WITH OPEN ROTATOR CUFF REPAIR AND MINI OPEN BICEPS TENODESIS;  Surgeon: Christena Flake, MD;  Location: [facility];  Service: Orthopedics;;   SHOULDER ARTHROSCOPY WITH SUBACROMIAL DECOMPRESSION  10/10/2018   Procedure: SHOULDER ARTHROSCOPY WITH SUBACROMIAL DECOMPRESSION;  Surgeon: Christena Flake, MD;  Location: [facility];  Service: Orthopedics;;   TUBAL LIGATION  1996    There were no vitals filed for this visit.   Subjective Assessment - 11/11/20 0909     Subjective Pt reports instances of pain with transferring in/out of vehicles. She reports pain in her knees is less than 1/10 on NPRS and they feel "sore."    Pertinent History Krystal Woodward is a 65 year old female with complaints of bilateral knee pain with the L knee occasionally buckling with walking, standing, and stair negotiation. Pt states her knee pain became more noticeable on May 21st after standing from a chair. Pt reports her pain today is 3-4/10 but increases and becomes more sharp with previously stated activities. Pt lives in a two story home with stairs to enter and access the 2nd floor. Pt reports having one fall in last six months without neccessary hospitilization. Patient denies any numbness or tingling and any red flags that would prevent physical therapy. Pt has previously had therapy for RCR and was previously given exercises to address knee pain. 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 Pt would like to perform ADLs and return to  weight lifting with less pain. Pt reports being allergic to latex.    Limitations Standing;Lifting;Walking    How long can you sit comfortably? Unlimited    Diagnostic tests XRAY  AP and lateral of the bilateral knees dated today demonstrate mild patellofemoral narrowing. The medial and lateral compartments appear normal. (Aluisio    Patient Stated Goals Patient would like to return to lifting weights and perform stair negotiation with less pain.    Pain Onset More than a month ago             Therex  Nu Step L4 x 5 min >60 SPM Seat:8 LE only  Glute  bridges 3 x10 reps w/ 5# db  cues to tighten core and buttocks  STS (GTB) 3 x 10 reps from mat table 22 inches cues to bend at knees not waist. 5# DB Cueing to limit rocking    Seated knee ext 7.5# AW alternating 2 x 20 ea LE cueing to scoot forward in chair and extend knee fully  Up/Downs on 6 inch step 2 x 10 reps each LE with emphasis on  eccentric lowering and decreasing hyperextension with stance  Manual Therapy   Medial Patellar mobilizations with knee extension  Taping to bilateral Knees in C shape pulling patella lateral to medial                           PT Education - 11/11/20 0945     Education Details Pt educated on form with stair negotiation. "Don't lock knee (hyperextension) and knee over toe."    Person(s) Educated Patient    Methods Explanation;Demonstration    Comprehension Verbalized understanding;Returned demonstration              PT Short Term Goals - 10/06/20 1120       PT SHORT TERM GOAL #1   Title Patient will be independent with home exercise program to improve mobility and self manage symptoms.    Baseline Patient has previously received exercises but has not become independent.    Time 4    Period Weeks    Status New    Target Date 11/03/20               PT Long Term Goals - 10/11/20 1008       PT LONG TERM GOAL #1   Title Pt will be able to negotiate stairs utilizing reciprocal pattern with reports of pain no greater than 1/10 on NPRS in order to safely enter home and community buildings.    Baseline 10/06/2020: descends stairs with pain 4/10 utilizing lateral step-to pattern.    Time 8    Period Weeks    Status New      PT LONG TERM GOAL #2   Title Pt will report a score of 61 on FOTO in to demonstrate improvement in tolerance for functional acitivies.    Baseline 10/06/20: 42    Time 8    Period Weeks    Status New      PT LONG TERM GOAL #3   Title Patient will demonstrate gross lower extremity  strength as 5/5 bilaterally in order to perform ADLs and return to prior exercise regimen.    Baseline 10/06/20: gross LE strength 4/4 bilaterally    Time 8    Period Weeks    Status New      PT LONG TERM GOAL #4   Title Patient will perform 5XSTS within  15 seconds to demostrate increased LE strength and decrease risk of falling    Baseline 10/11/20 24.22 sec    Time 8    Period Weeks    Status New    Target Date 12/01/20      PT LONG TERM GOAL #5   Title Patient will increase comfortable gait speed to 0.80 m/s in order to demonstrate improved ambulation for participation in the community ambulation.    Baseline 10/11/20 (0.6 m/s) self selected speed    Time 8    Period Weeks    Status New    Target Date 12/01/20      Additional Long Term Goals   Additional Long Term Goals Yes                   Plan - 11/11/20 1047     Clinical Impression Statement Pt tolerated session well evidenced by no increase in bilat knee pain following therex and manual therapy. PT continued therex progression of LE strengthening with 0-60 knee flexion in order to limit increase in pain with activity. PT will continue to utilize mutimodal cues to decrease compensatory motor patterns and increase quadriceps activation.    Personal Factors and Comorbidities Age;Fitness    Examination-Activity Limitations Locomotion Level;Lift;Stairs;Stand;Squat    Examination-Participation Restrictions Community Activity;Cleaning    Stability/Clinical Decision Making Stable/Uncomplicated    Rehab Potential Good    PT Frequency 2x / week    PT Duration 8 weeks    PT Treatment/Interventions Cryotherapy;Electrical Stimulation;Moist Heat;Traction;Gait training;Stair training;Functional mobility training;Therapeutic activities;Therapeutic exercise;Balance training;Neuromuscular re-education;Patient/family education;Manual techniques;Taping;Dry needling;Passive range of motion;Joint Manipulations    PT Next Visit Plan  Continue LE strengthening, LE mobility, and stair training. Try taping again next visit    PT Home Exercise Plan mini squat, seated knee ext, hip flex, hip abd    Consulted and Agree with Plan of Care Patient             Patient will benefit from skilled therapeutic intervention in order to improve the following deficits and impairments:  Decreased mobility, Decreased endurance, Difficulty walking, Decreased activity tolerance, Decreased strength, Pain, Abnormal gait, Decreased balance, Improper body mechanics, Postural dysfunction  Visit Diagnosis: Chronic pain of left knee  Chronic pain of right knee     Problem List Patient Active Problem List   Diagnosis Date Noted   Right shoulder pain 08/04/2020   Bilateral cataracts 01/20/2018   Post-menopausal 01/14/2016   Pre-diabetes 01/06/2015   Bilateral knee pain 01/04/2015   Overweight 01/04/2015   Hyperprolactinemia (HCC) 01/04/2015   Fatigue 01/04/2015   Family history of diabetes mellitus 01/04/2015   Family history of colon cancer 01/04/2015   MURMUR 06/30/2010     Hilda Lias DPT Romilda Joy, SPT  Hilda Lias 11/11/2020, 3:13 PM  [facility] [address], [place], [address] Phone: [phone]   Fax:  [phone]  Name: Teosha Casso MRN: [id number] Date of Birth: 06-05-1955

## 2020-11-16 ENCOUNTER — Ambulatory Visit: Payer: No Typology Code available for payment source | Admitting: Physical Therapy

## 2020-11-16 ENCOUNTER — Encounter: Payer: Self-pay | Admitting: Physical Therapy

## 2020-11-16 DIAGNOSIS — M25562 Pain in left knee: Secondary | ICD-10-CM | POA: Diagnosis not present

## 2020-11-16 DIAGNOSIS — G8929 Other chronic pain: Secondary | ICD-10-CM

## 2020-11-16 DIAGNOSIS — M25561 Pain in right knee: Secondary | ICD-10-CM

## 2020-11-16 NOTE — Therapy (Signed)
[facility] [address] [place], [address] Phone: [phone]   Fax:  [phone]  Physical Therapy Treatment/Progress Note  Reporting Period 10/06/2020-11/16/2020  Patient Details  Name: Krystal Woodward MRN: [id number] Date of Birth: 1955/11/10 No data recorded  Encounter Date: 11/16/2020   PT End of Session - 11/16/20 0902     Visit Number 10    Number of Visits 17    Date for PT Re-Evaluation 11/03/20    PT Start Time 0900    PT Stop Time 0947    PT Time Calculation (min) 47 min    Activity Tolerance Patient tolerated treatment well    Behavior During Therapy [facility] for tasks assessed/performed             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   Breast mass 01/24/2011   Chest pain    Elevated liver enzymes    GERD (gastroesophageal reflux disease)    h/o   Hyperprolactinemia (HCC) 9767;3419   Normal MRI   Vitamin D deficiency     Past Surgical History:  Procedure Laterality Date   BREAST BIOPSY Right 06/17/15   Inflammed Fibrocyst   BREAST CYST EXCISION  02/27/2011   Procedure: CYST EXCISION BREAST;  Surgeon: Currie Paris, MD;  Location: [facility];  Service: General;  Laterality: Left;  Needle localization removal left breast mass   BREAST EXCISIONAL BIOPSY Left    BUNIONECTOMY     both feet   COLONOSCOPY WITH PROPOFOL N/A 09/10/2019   Procedure: COLONOSCOPY WITH PROPOFOL;  Surgeon: Toledo, Boykin Nearing, MD;  Location: [facility];  Service: Gastroenterology;  Laterality: N/A;   INCISION AND DRAINAGE ABSCESS Right 07/28/2015   Procedure: INCISION AND DRAINAGE RIGHT BREAST  ABSCESS;  Surgeon: Manus Rudd, MD;  [facility];  Service: General;  Laterality: Right;   SHOULDER ARTHROSCOPY WITH ROTATOR CUFF REPAIR AND OPEN BICEPS TENODESIS  10/10/2018   Procedure: SHOULDER ARTHROSCOPY WITH OPEN ROTATOR CUFF  REPAIR AND MINI OPEN BICEPS TENODESIS;  Surgeon: Christena Flake, MD;  Location: [facility];  Service: Orthopedics;;   SHOULDER ARTHROSCOPY WITH SUBACROMIAL DECOMPRESSION  10/10/2018   Procedure: SHOULDER ARTHROSCOPY WITH SUBACROMIAL DECOMPRESSION;  Surgeon: Christena Flake, MD;  Location: [facility];  Service: Orthopedics;;   TUBAL LIGATION  1996    There were no vitals filed for this visit.   Subjective Assessment - 11/16/20 0904     Subjective Pt reports instances of pain with HEP. She reports pain in her knees is less than 1/10 on NPRS.    Pertinent History Krystal Woodward is a 65 year old female with complaints of bilateral knee pain with the L knee occasionally buckling with walking, standing, and stair negotiation. Pt states her knee pain became more noticeable on May 21st after standing from a chair. Pt reports her pain today is 3-4/10 but increases and becomes more sharp with previously stated activities. Pt lives in a two story home with stairs to enter and access the 2nd floor. Pt reports having one fall in last six months without neccessary hospitilization. Patient denies any numbness or tingling and any red flags that would prevent physical therapy. Pt has previously had therapy for RCR and was previously given exercises to address knee pain. 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 Pt would like to perform ADLs and return to weight lifting  with less pain. Pt reports being allergic to latex.    Limitations Standing;Lifting;Walking    Diagnostic tests XRAY  AP and lateral of the bilateral knees dated today demonstrate mild patellofemoral narrowing. The medial and lateral compartments appear normal. (Aluisio    Patient Stated Goals Patient would like to return to lifting weights and perform stair negotiation with less pain.            Therapeutic Exercise  Total Gym Squats 2 x 10 reps with cues to not hyperextension  Bent over Hip Extension 2 x 12 reps #2 ankle wt  with demo/cueing to not rotate pelvis  Sumo Squats 1 x 10 reps 10#; 1 x 10 20# KB; 1 x 10 20# KB with squat depths to height of chair+airex cues to go deeper and not hyperextend knees  5XSTS: 20.2 sec : 8 sec(1.41m/s)                            PT Education - 11/16/20 1008     Education Details Pt educated on progress to date.    Person(s) Educated Patient    Methods Explanation    Comprehension Verbalized understanding              PT Short Term Goals - 11/16/20 0927       PT SHORT TERM GOAL #1   Title Patient will be independent with home exercise program to improve mobility and self manage symptoms.    Baseline Patient has previously received exercises but has not become independent 11/16/20: reports performing  3x per week    Time 4    Period Weeks    Status Achieved               PT Long Term Goals - 11/16/20 0911       PT LONG TERM GOAL #1   Title Pt will be able to negotiate stairs utilizing reciprocal pattern with reports of pain no greater than 1/10 on NPRS in order to safely enter home and community buildings.    Baseline 10/06/2020: descends stairs with pain 4/10 utilizing lateral step-to pattern. 11/16/20: Pt demonstrated reciprocal gait negotiating steps and reports less than 1/10 without reports of legs giving way.    Time 8    Period Weeks    Status Achieved      PT LONG TERM GOAL #2   Title Pt will report a score of 61 on FOTO in to demonstrate improvement in tolerance for functional acitivies.    Baseline 10/06/20: 42    Time 8    Status Deferred      PT LONG TERM GOAL #3   Title Patient will demonstrate gross lower extremity strength as 5/5 bilaterally in order to perform ADLs and return to prior exercise regimen.    Baseline 10/06/20: gross LE strength 4/4 bilaterally 11/16/20 grossly 4/4 (Hip extension abduction strength focus)    Time 8    Period Weeks    Status On-going      PT LONG TERM GOAL #4   Title Patient  will perform 5XSTS within 15 seconds to demonstrate increased LE strength and decrease risk of falling    Baseline 10/11/20 24.22 sec 11/16/20: 20.2 sec    Time 8    Period Weeks    Status On-going      PT LONG TERM GOAL #5   Title Patient will increase comfortable gait speed to 0.80 m/s in order to  demonstrate improved ambulation for participation in the community ambulation.    Baseline 10/11/20 (0.6 m/s) self selected speed; 11/16/20:1.25 m/s    Time 8    Period Weeks    Status Achieved                   Plan - 11/16/20 1002     Clinical Impression Statement PT progress note performed this date. Pt demonstrated increased gait speed and ability to negotiate 4 steps without increased pain greater than 1/10 in bilat knees. PT to continue focus on LE strengthening specifically targetting hip extension, abduction, and knee extension. Pt continues to demonstrate decrease ability to perform sit>stand transfer less than 15 sec, and decreased hip strength MMT. Continue PT POC.    Personal Factors and Comorbidities Age;Fitness    Examination-Activity Limitations Locomotion Level;Lift;Stairs;Stand;Squat    Examination-Participation Restrictions Psychiatric nurse Stable/Uncomplicated    Clinical Decision Making Moderate    Rehab Potential Good    PT Frequency 2x / week    PT Duration 8 weeks    PT Treatment/Interventions Cryotherapy;Electrical Stimulation;Moist Heat;Traction;Gait training;Stair training;Functional mobility training;Therapeutic activities;Therapeutic exercise;Balance training;Neuromuscular re-education;Patient/family education;Manual techniques;Taping;Dry needling;Passive range of motion;Joint Manipulations    PT Next Visit Plan Continue LE strengthening hip extension, abduction, and knee extension.    PT Home Exercise Plan mini squat, seated knee ext, hip flex, hip abd    Consulted and Agree with Plan of Care Patient              Patient will benefit from skilled therapeutic intervention in order to improve the following deficits and impairments:  Decreased mobility, Decreased endurance, Difficulty walking, Decreased activity tolerance, Decreased strength, Pain, Abnormal gait, Decreased balance, Improper body mechanics, Postural dysfunction  Visit Diagnosis: Chronic pain of left knee  Chronic pain of right knee     Problem List Patient Active Problem List   Diagnosis Date Noted   Right shoulder pain 08/04/2020   Bilateral cataracts 01/20/2018   Post-menopausal 01/14/2016   Pre-diabetes 01/06/2015   Bilateral knee pain 01/04/2015   Overweight 01/04/2015   Hyperprolactinemia (HCC) 01/04/2015   Fatigue 01/04/2015   Family history of diabetes mellitus 01/04/2015   Family history of colon cancer 01/04/2015   MURMUR 06/30/2010    Krystal Woodward DPT Krystal Woodward, SPT  Krystal Woodward 11/16/2020, 11:35 AM  [facility] [address], [place], [address] Phone: [phone]   Fax:  [phone]  Name: Krystal Woodward MRN: [id number] Date of Birth: 1955-06-07

## 2020-11-18 ENCOUNTER — Ambulatory Visit: Payer: No Typology Code available for payment source | Admitting: Physical Therapy

## 2020-11-18 ENCOUNTER — Encounter: Payer: Self-pay | Admitting: Physical Therapy

## 2020-11-18 DIAGNOSIS — G8929 Other chronic pain: Secondary | ICD-10-CM

## 2020-11-18 DIAGNOSIS — M25562 Pain in left knee: Secondary | ICD-10-CM | POA: Diagnosis not present

## 2020-11-18 NOTE — Therapy (Signed)
[place] [facility] [address], [place], [address] Phone: [phone]   Fax:  [phone]  Physical Therapy Treatment  Patient Details  Name: Krystal Woodward MRN: [id number] Date of Birth: 13-Jan-1956 No data recorded  Encounter Date: 11/18/2020   PT End of Session - 11/18/20 0910     Visit Number 11    Number of Visits 17    Date for PT Re-Evaluation 11/03/20    PT Start Time 0905    PT Stop Time 0945    PT Time Calculation (min) 40 min    Activity Tolerance Patient tolerated treatment well    Behavior During Therapy [facility] for tasks assessed/performed             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   Breast mass 01/24/2011   Chest pain    Elevated liver enzymes    GERD (gastroesophageal reflux disease)    h/o   Hyperprolactinemia (HCC) 9417;4081   Normal MRI   Vitamin D deficiency     Past Surgical History:  Procedure Laterality Date   BREAST BIOPSY Right 06/17/15   Inflammed Fibrocyst   BREAST CYST EXCISION  02/27/2011   Procedure: CYST EXCISION BREAST;  Surgeon: Currie Paris, MD;  Location: [facility];  Service: General;  Laterality: Left;  Needle localization removal left breast mass   BREAST EXCISIONAL BIOPSY Left    BUNIONECTOMY     both feet   COLONOSCOPY WITH PROPOFOL N/A 09/10/2019   Procedure: COLONOSCOPY WITH PROPOFOL;  Surgeon: Toledo, Boykin Nearing, MD;  Location: [facility];  Service: Gastroenterology;  Laterality: N/A;   INCISION AND DRAINAGE ABSCESS Right 07/28/2015   Procedure: INCISION AND DRAINAGE RIGHT BREAST  ABSCESS;  Surgeon: Manus Rudd, MD;  Location:  SURGERY CENTER;  Service: General;  Laterality: Right;   SHOULDER ARTHROSCOPY WITH ROTATOR CUFF REPAIR AND OPEN BICEPS TENODESIS  10/10/2018   Procedure: SHOULDER ARTHROSCOPY WITH OPEN ROTATOR CUFF REPAIR AND MINI OPEN BICEPS TENODESIS;  Surgeon: Christena Flake, MD;  Location: [facility];  Service: Orthopedics;;   SHOULDER ARTHROSCOPY WITH SUBACROMIAL DECOMPRESSION  10/10/2018   Procedure: SHOULDER ARTHROSCOPY WITH SUBACROMIAL DECOMPRESSION;  Surgeon: Christena Flake, MD;  Location: [facility];  Service: Orthopedics;;   TUBAL LIGATION  1996    There were no vitals filed for this visit.   Subjective Assessment - 11/18/20 0907     Subjective Pt reports she is doing okay but she has increased pain in her L knee this session. She ranks her pain as a 3/10 on NPRS.    Pertinent History Krystal Woodward is a 65 year old female with complaints of bilateral knee pain with the L knee occasionally buckling with walking, standing, and stair negotiation. Pt states her knee pain became more noticeable on May 21st after standing from a chair. Pt reports her pain today is 3-4/10 but increases and becomes more sharp with previously stated activities. Pt lives in a two story home with stairs to enter and access the 2nd floor. Pt reports having one fall in last six months without neccessary hospitilization. Patient denies any numbness or tingling and any red flags that would prevent physical therapy. Pt has previously had therapy for RCR and was previously given exercises to address knee pain. 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 Pt would like to perform ADLs and return to  weight lifting with less pain. Pt reports being allergic to latex.    Limitations Standing;Lifting;Walking    How long can you sit comfortably? Unlimited    Diagnostic tests XRAY  AP and lateral of the bilateral knees dated today demonstrate mild patellofemoral narrowing. The medial and lateral compartments appear normal. (Aluisio    Patient Stated Goals Patient would like to return to lifting weights and perform stair negotiation with less pain.            Therapeutics Exercises  Glute Bridges 1 x 10 reps; 2 x 10 reps 15# cues to push bottom towards ceiling and hold 3 sec    Bent over Hip Extension 2 x 12 reps #2 ankle wt with demo/cueing to not rotate pelvis   Sumo Squats 2 x 10 reps 10#; 1  x 10 20# KB; 1 x 10 20# KB with squat depth of 23 in to go deeper and not hyperextend knees with little carry over. PT utilized tactile cueing to limit hyperextension and knee locking.  Monsters walks GTB 2 x 10 meters cues to maintain mini squat and to keep tension in the band  Backwards walks GTB above knee 2 x 10 meters cue to extend hip out and to the side  Seated Knee Extension 1 x 10 reps 5#                          PT Education - 11/18/20 1102     Education Details Pt educated on therex form/technique and to decrease knee hyperextension with exercise.    Person(s) Educated Patient    Methods Explanation;Demonstration;Tactile cues;Verbal cues    Comprehension Verbalized understanding;Returned demonstration              PT Short Term Goals - 11/16/20 9528       PT SHORT TERM GOAL #1   Title Patient will be independent with home exercise program to improve mobility and self manage symptoms.    Baseline Patient has previously received exercises but has not become independent 11/16/20: reports performing  3x per week    Time 4    Period Weeks    Status Achieved               PT Long Term Goals - 11/16/20 0911       PT LONG TERM GOAL #1   Title Pt will be able to negotiate stairs utilizing reciprocal pattern with reports of pain no greater than 1/10 on NPRS in order to safely enter home and community buildings.    Baseline 10/06/2020: descends stairs with pain 4/10 utilizing lateral step-to pattern. 11/16/20: Pt demonstrated reciprocal gait negotiating steps and reports less than 1/10 without reports of legs giving way.    Time 8    Period Weeks    Status Achieved      PT LONG TERM GOAL #2   Title Pt will report a score of 61 on FOTO in to demonstrate improvement in tolerance for functional acitivies.    Baseline 10/06/20:  42    Time 8    Status Deferred      PT LONG TERM GOAL #3   Title Patient will demonstrate gross lower extremity strength as 5/5 bilaterally in order to perform ADLs and return to prior exercise regimen.    Baseline 10/06/20: gross LE strength 4/4 bilaterally 11/16/20 grossly 4/4 (Hip extension abduction strength focus)    Time 8    Period Weeks  Status On-going      PT LONG TERM GOAL #4   Title Patient will perform 5XSTS within 15 seconds to demonstrate increased LE strength and decrease risk of falling    Baseline 10/11/20 24.22 sec 11/16/20: 20.2 sec    Time 8    Period Weeks    Status On-going      PT LONG TERM GOAL #5   Title Patient will increase comfortable gait speed to 0.80 m/s in order to demonstrate improved ambulation for participation in the community ambulation.    Baseline 10/11/20 (0.6 m/s) self selected speed; 11/16/20:1.25 m/s    Time 8    Period Weeks    Status Achieved                   Plan - 11/18/20 1052     Clinical Impression Statement Patient tolerated session well evidenced no increase in lasting pain followin therapeutic exercise. PT session continued LE strengthening progression targeting hip extensor, hip abd, and knee extensor strength.  Patient needs skilled PT to  and multimodal cueing to ensure safe and effective exercise progression to achieve set goals.    Personal Factors and Comorbidities Age;Fitness    Examination-Activity Limitations Locomotion Level;Lift;Stairs;Stand;Squat    Examination-Participation Restrictions Psychiatric nurse Stable/Uncomplicated    Clinical Decision Making Moderate    Rehab Potential Good    PT Frequency 2x / week    PT Duration 8 weeks    PT Treatment/Interventions Cryotherapy;Electrical Stimulation;Moist Heat;Traction;Gait training;Stair training;Functional mobility training;Therapeutic activities;Therapeutic exercise;Balance training;Neuromuscular  re-education;Patient/family education;Manual techniques;Taping;Dry needling;Passive range of motion;Joint Manipulations    PT Next Visit Plan Continue LE strengthening hip extension, abduction, and knee extension.    PT Home Exercise Plan mini squat, seated knee ext, hip flex, hip abd    Consulted and Agree with Plan of Care Patient             Patient will benefit from skilled therapeutic intervention in order to improve the following deficits and impairments:  Decreased mobility, Decreased endurance, Difficulty walking, Decreased activity tolerance, Decreased strength, Pain, Abnormal gait, Decreased balance, Improper body mechanics, Postural dysfunction  Visit Diagnosis: Chronic pain of left knee  Chronic pain of right knee     Problem List Patient Active Problem List   Diagnosis Date Noted   Right shoulder pain 08/04/2020   Bilateral cataracts 01/20/2018   Post-menopausal 01/14/2016   Pre-diabetes 01/06/2015   Bilateral knee pain 01/04/2015   Overweight 01/04/2015   Hyperprolactinemia (HCC) 01/04/2015   Fatigue 01/04/2015   Family history of diabetes mellitus 01/04/2015   Family history of colon cancer 01/04/2015   MURMUR 06/30/2010    Hilda Lias DPT Romilda Joy, SPT  Hilda Lias 11/18/2020, 4:03 PM  [place] [facility] [address] [place], [address] Phone: [phone]   Fax:  [phone]  Name: Inella Kuwahara MRN: [id number] Date of Birth: 1956-03-24

## 2020-11-22 ENCOUNTER — Ambulatory Visit: Payer: No Typology Code available for payment source | Attending: Orthopedic Surgery | Admitting: Physical Therapy

## 2020-11-22 ENCOUNTER — Encounter: Payer: Self-pay | Admitting: Physical Therapy

## 2020-11-22 DIAGNOSIS — M25561 Pain in right knee: Secondary | ICD-10-CM | POA: Insufficient documentation

## 2020-11-22 DIAGNOSIS — G8929 Other chronic pain: Secondary | ICD-10-CM | POA: Insufficient documentation

## 2020-11-22 DIAGNOSIS — M25562 Pain in left knee: Secondary | ICD-10-CM | POA: Insufficient documentation

## 2020-11-22 NOTE — Therapy (Signed)
[place] [facility] [address] [place], [address] Phone: [phone]   Fax:  [phone]  Physical Therapy Treatment  Patient Details  Name: Krystal Woodward MRN: [id number] Date of Birth: 10/09/1955 No data recorded  Encounter Date: 11/22/2020   PT End of Session - 11/22/20 0831     Visit Number 12    Number of Visits 17    Date for PT Re-Evaluation 12/20/20    PT Start Time 0817    PT Stop Time 0900    PT Time Calculation (min) 43 min    Activity Tolerance Patient tolerated treatment well    Behavior During Therapy [facility] for tasks assessed/performed             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   Breast mass 01/24/2011   Chest pain    Elevated liver enzymes    GERD (gastroesophageal reflux disease)    h/o   Hyperprolactinemia (HCC) 7654;6503   Normal MRI   Vitamin D deficiency     Past Surgical History:  Procedure Laterality Date   BREAST BIOPSY Right 06/17/15   Inflammed Fibrocyst   BREAST CYST EXCISION  02/27/2011   Procedure: CYST EXCISION BREAST;  Surgeon: Currie Paris, MD;  Location: [facility];  Service: General;  Laterality: Left;  Needle localization removal left breast mass   BREAST EXCISIONAL BIOPSY Left    BUNIONECTOMY     both feet   COLONOSCOPY WITH PROPOFOL N/A 09/10/2019   Procedure: COLONOSCOPY WITH PROPOFOL;  Surgeon: Toledo, Boykin Nearing, MD;  Location: [facility];  Service: Gastroenterology;  Laterality: N/A;   INCISION AND DRAINAGE ABSCESS Right 07/28/2015   Procedure: INCISION AND DRAINAGE RIGHT BREAST  ABSCESS;  Surgeon: Manus Rudd, MD;  Location: [facility];  Service: General;  Laterality: Right;   SHOULDER ARTHROSCOPY WITH ROTATOR CUFF REPAIR AND OPEN BICEPS TENODESIS  10/10/2018   Procedure: SHOULDER ARTHROSCOPY WITH OPEN ROTATOR CUFF REPAIR AND MINI OPEN BICEPS TENODESIS;  Surgeon: Christena Flake, MD;  Location: [facility];  Service: Orthopedics;;   SHOULDER ARTHROSCOPY WITH SUBACROMIAL DECOMPRESSION  10/10/2018   Procedure: SHOULDER ARTHROSCOPY WITH SUBACROMIAL DECOMPRESSION;  Surgeon: Christena Flake, MD;  Location: [facility];  Service: Orthopedics;;   TUBAL LIGATION  1996    There were no vitals filed for this visit.   Subjective Assessment - 11/22/20 0822     Subjective Pt reports she continues to have intermittent pain in bilat knees that are activity specific. She reports 1-2/10 on NPRS at rest.    Pertinent History Adlyn Fife is a 65 year old female with complaints of bilateral knee pain with the L knee occasionally buckling with walking, standing, and stair negotiation. Pt states her knee pain became more noticeable on May 21st after standing from a chair. Pt reports her pain today is 3-4/10 but increases and becomes more sharp with previously stated activities. Pt lives in a two story home with stairs to enter and access the 2nd floor. Pt reports having one fall in last six months without neccessary hospitilization. Patient denies any numbness or tingling and any red flags that would prevent physical therapy. Pt has previously had therapy for RCR and was previously given exercises to address knee pain. 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 Pt would like to perform ADLs and return to weight lifting with less  pain. Pt reports being allergic to latex.    Limitations Standing;Lifting;Walking    How long can you sit comfortably? Unlimited    Diagnostic tests XRAY  AP and lateral of the bilateral knees dated today demonstrate mild patellofemoral narrowing. The medial and lateral compartments appear normal. (Aluisio    Patient Stated Goals Patient would like to return to lifting weights and perform stair negotiation with less pain.             Therex  Nu Step L4 x 6 min >60 SPM Seat:9  Glute bridges 3 x10 reps w/ GTB above knees cues to tighten core  and buttocks  Sidelying Hip Abduction 3x 10 reps 2# AW with multimodal cueing for set up with god carry over   SLR 2# AW  2 x 10 reps bilat LE  Dead Lift #10 from mat table 2 x 10 reps knees bend to approx 45 deg  Wall slides (squats) 10# Db 2 x 10 reps with cues to prevent knees over toes  Floor recovery x 2 trials with cues to come to tall kneeling and use LE to come to standing.                              PT Education - 11/22/20 0825     Education Details therex form/technique              PT Short Term Goals - 11/16/20 0927       PT SHORT TERM GOAL #1   Title Patient will be independent with home exercise program to improve mobility and self manage symptoms.    Baseline Patient has previously received exercises but has not become independent 11/16/20: reports performing  3x per week    Time 4    Period Weeks    Status Achieved               PT Long Term Goals - 11/16/20 0911       PT LONG TERM GOAL #1   Title Pt will be able to negotiate stairs utilizing reciprocal pattern with reports of pain no greater than 1/10 on NPRS in order to safely enter home and community buildings.    Baseline 10/06/2020: descends stairs with pain 4/10 utilizing lateral step-to pattern. 11/16/20: Pt demonstrated reciprocal gait negotiating steps and reports less than 1/10 without reports of legs giving way.    Time 8    Period Weeks    Status Achieved      PT LONG TERM GOAL #2   Title Pt will report a score of 61 on FOTO in to demonstrate improvement in tolerance for functional acitivies.    Baseline 10/06/20: 42    Time 8    Status Deferred      PT LONG TERM GOAL #3   Title Patient will demonstrate gross lower extremity strength as 5/5 bilaterally in order to perform ADLs and return to prior exercise regimen.    Baseline 10/06/20: gross LE strength 4/4 bilaterally 11/16/20 grossly 4/4 (Hip extension abduction strength focus)    Time 8    Period Weeks     Status On-going      PT LONG TERM GOAL #4   Title Patient will perform 5XSTS within 15 seconds to demonstrate increased LE strength and decrease risk of falling    Baseline 10/11/20 24.22 sec 11/16/20: 20.2 sec    Time 8    Period Weeks  Status On-going      PT LONG TERM GOAL #5   Title Patient will increase comfortable gait speed to 0.80 m/s in order to demonstrate improved ambulation for participation in the community ambulation.    Baseline 10/11/20 (0.6 m/s) self selected speed; 11/16/20:1.25 m/s    Time 8    Period Weeks    Status Achieved                   Plan - 11/22/20 0949     Clinical Impression Statement Patient tolerated session well evidenced by no increase in bilat knee pain at end of session pain following therapeutic exercise. PT session focused on a combination of open and closed chain exercises to increase LE strength and edurance. Patient will benefit to benefit from skilled PT to ensure safe and effective exercise progression to achieve set goals.    Personal Factors and Comorbidities Age;Fitness    Examination-Activity Limitations Locomotion Level;Lift;Stairs;Stand;Squat    Examination-Participation Restrictions Psychiatric nurse Stable/Uncomplicated    Clinical Decision Making Moderate    Rehab Potential Good    PT Frequency 2x / week    PT Duration 8 weeks    PT Treatment/Interventions Cryotherapy;Electrical Stimulation;Moist Heat;Traction;Gait training;Stair training;Functional mobility training;Therapeutic activities;Therapeutic exercise;Balance training;Neuromuscular re-education;Patient/family education;Manual techniques;Taping;Dry needling;Passive range of motion;Joint Manipulations    PT Next Visit Plan Continue LE strengthening hip extension, abduction, and knee extension.    PT Home Exercise Plan mini squat, seated knee ext, hip flex, hip abd    Consulted and Agree with Plan of Care Patient              Patient will benefit from skilled therapeutic intervention in order to improve the following deficits and impairments:  Decreased mobility, Decreased endurance, Difficulty walking, Decreased activity tolerance, Decreased strength, Pain, Abnormal gait, Decreased balance, Improper body mechanics, Postural dysfunction  Visit Diagnosis: Chronic pain of left knee  Chronic pain of right knee     Problem List Patient Active Problem List   Diagnosis Date Noted   Right shoulder pain 08/04/2020   Bilateral cataracts 01/20/2018   Post-menopausal 01/14/2016   Pre-diabetes 01/06/2015   Bilateral knee pain 01/04/2015   Overweight 01/04/2015   Hyperprolactinemia (HCC) 01/04/2015   Fatigue 01/04/2015   Family history of diabetes mellitus 01/04/2015   Family history of colon cancer 01/04/2015   MURMUR 06/30/2010     Hilda Lias DPT Romilda Joy, SPT  Hilda Lias 11/22/2020, 2:37 PM  [place] [facility] [address] [place], [address] Phone: [phone]   Fax:  [phone]  Name: Krystal Woodward MRN: [id number] Date of Birth: April 11, 1956

## 2020-11-23 ENCOUNTER — Telehealth: Payer: Self-pay

## 2020-11-23 NOTE — Telephone Encounter (Signed)
Patient would like to proceed with MR abdomen that was originally ordered 08-23-2020. Thanks!

## 2020-11-23 NOTE — Telephone Encounter (Signed)
Copied from CRM [id number]. Topic: General - Inquiry >> Nov 22, 2020  3:17 PM Daphine Deutscher D wrote: Reason for CRM: Pt called saying the last tie she was in with side pain Dr. Sherrie Mustache told her that he could order an MRI.She would like for him to do this.  CB#  [phone]

## 2020-11-24 ENCOUNTER — Ambulatory Visit: Payer: No Typology Code available for payment source | Admitting: Physical Therapy

## 2020-11-24 ENCOUNTER — Encounter: Payer: Self-pay | Admitting: Physical Therapy

## 2020-11-24 DIAGNOSIS — M25562 Pain in left knee: Secondary | ICD-10-CM

## 2020-11-24 DIAGNOSIS — G8929 Other chronic pain: Secondary | ICD-10-CM

## 2020-11-24 NOTE — Therapy (Signed)
[facility] [address] [place], [address] Phone: [phone]   Fax:  [phone]  Physical Therapy Treatment  Patient Details  Name: Krystal Woodward MRN: [id number] Date of Birth: 01-19-1956 No data recorded  Encounter Date: 11/24/2020   PT End of Session - 11/24/20 0823     Visit Number 13    Number of Visits 17    Date for PT Re-Evaluation 12/20/20    PT Start Time 0820    PT Stop Time 0902    PT Time Calculation (min) 42 min    Activity Tolerance Patient tolerated treatment well    Behavior During Therapy [facility] for tasks assessed/performed             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   Breast mass 01/24/2011   Chest pain    Elevated liver enzymes    GERD (gastroesophageal reflux disease)    h/o   Hyperprolactinemia (HCC) 3976;7341   Normal MRI   Vitamin D deficiency     Past Surgical History:  Procedure Laterality Date   BREAST BIOPSY Right 06/17/15   Inflammed Fibrocyst   BREAST CYST EXCISION  02/27/2011   Procedure: CYST EXCISION BREAST;  Surgeon: Currie Paris, MD;  Location: [facility];  Service: General;  Laterality: Left;  Needle localization removal left breast mass   BREAST EXCISIONAL BIOPSY Left    BUNIONECTOMY     both feet   COLONOSCOPY WITH PROPOFOL N/A 09/10/2019   Procedure: COLONOSCOPY WITH PROPOFOL;  Surgeon: Toledo, Boykin Nearing, MD;  Location: [facility];  Service: Gastroenterology;  Laterality: N/A;   INCISION AND DRAINAGE ABSCESS Right 07/28/2015   Procedure: INCISION AND DRAINAGE RIGHT BREAST  ABSCESS;  Surgeon: Manus Rudd, MD;  Location: [facility];  Service: General;  Laterality: Right;   SHOULDER ARTHROSCOPY WITH ROTATOR CUFF REPAIR AND OPEN BICEPS TENODESIS  10/10/2018   Procedure: SHOULDER ARTHROSCOPY WITH OPEN ROTATOR CUFF REPAIR AND MINI OPEN BICEPS TENODESIS;  Surgeon: Christena Flake, MD;  Location: [facility];  Service: Orthopedics;;   SHOULDER ARTHROSCOPY WITH SUBACROMIAL DECOMPRESSION  10/10/2018   Procedure: SHOULDER ARTHROSCOPY WITH SUBACROMIAL DECOMPRESSION;  Surgeon: Christena Flake, MD;  Location: [facility];  Service: Orthopedics;;   TUBAL LIGATION  1996    There were no vitals filed for this visit.   Subjective Assessment - 11/24/20 0824     Subjective Pt reports she walked 4 miles yesterday and that it aggravated her knees. She reports her pain in bilat knees is 4/10 on NPRS.    Pertinent History Krystal Woodward is a 65 year old female with complaints of bilateral knee pain with the L knee occasionally buckling with walking, standing, and stair negotiation. Pt states her knee pain became more noticeable on May 21st after standing from a chair. Pt reports her pain today is 3-4/10 but increases and becomes more sharp with previously stated activities. Pt lives in a two story home with stairs to enter and access the 2nd floor. Pt reports having one fall in last six months without neccessary hospitilization. Patient denies any numbness or tingling and any red flags that would prevent physical therapy. Pt has previously had therapy for RCR and was previously given exercises to address knee pain. 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 Pt would like to perform ADLs and return to weight lifting  with less pain. Pt reports being allergic to latex.    Limitations Standing;Lifting;Walking    How long can you sit comfortably? Unlimited    Diagnostic tests XRAY  AP and lateral of the bilateral knees dated today demonstrate mild patellofemoral narrowing. The medial and lateral compartments appear normal. (Aluisio    Patient Stated Goals Patient would like to return to lifting weights and perform stair negotiation with less pain.    Currently in Pain? Yes            Therapeutic Exercise  Nu Step L4 x 5 min >50 SPM  Glute bridges on bosu ball 3 x10 reps  #15DB cues to tighten core and buttocks  STS from 23 in Mat table 2 x10 reps 10DB; 1 x10 reps 15# DB   Clam shells 3x 10 reps GTB with multimodal cueing for set up with good carry over bilat    Bilateral Knee extensiton 3 x 10 reps GTB with cues to not hyperextend knee nor reach full extension.                            PT Education - 11/24/20 [phone]     Education Details Pt educated on participating in too much activity too quickly and to break it up into smaller session to decrease impact on her knees.    Person(s) Educated Patient    Methods Explanation    Comprehension Verbalized understanding              PT Short Term Goals - 11/16/20 0927       PT SHORT TERM GOAL #1   Title Patient will be independent with home exercise program to improve mobility and self manage symptoms.    Baseline Patient has previously received exercises but has not become independent 11/16/20: reports performing  3x per week    Time 4    Period Weeks    Status Achieved               PT Long Term Goals - 11/16/20 0911       PT LONG TERM GOAL #1   Title Pt will be able to negotiate stairs utilizing reciprocal pattern with reports of pain no greater than 1/10 on NPRS in order to safely enter home and community buildings.    Baseline 10/06/2020: descends stairs with pain 4/10 utilizing lateral step-to pattern. 11/16/20: Pt demonstrated reciprocal gait negotiating steps and reports less than 1/10 without reports of legs giving way.    Time 8    Period Weeks    Status Achieved      PT LONG TERM GOAL #2   Title Pt will report a score of 61 on FOTO in to demonstrate improvement in tolerance for functional acitivies.    Baseline 10/06/20: 42    Time 8    Status Deferred      PT LONG TERM GOAL #3   Title Patient will demonstrate gross lower extremity strength as 5/5 bilaterally in order to perform ADLs and return to prior exercise regimen.    Baseline 10/06/20: gross LE  strength 4/4 bilaterally 11/16/20 grossly 4/4 (Hip extension abduction strength focus)    Time 8    Period Weeks    Status On-going      PT LONG TERM GOAL #4   Title Patient will perform 5XSTS within 15 seconds to demonstrate increased LE strength and decrease risk of falling    Baseline 10/11/20  24.22 sec 11/16/20: 20.2 sec    Time 8    Period Weeks    Status On-going      PT LONG TERM GOAL #5   Title Patient will increase comfortable gait speed to 0.80 m/s in order to demonstrate improved ambulation for participation in the community ambulation.    Baseline 10/11/20 (0.6 m/s) self selected speed; 11/16/20:1.25 m/s    Time 8    Period Weeks    Status Achieved                   Plan - 11/24/20 0854     Clinical Impression Statement PT continued LE strengthening progression in open and closed chain areas/ranges to minimize increase in bilateral knee pain. Patient tolerated session well by no reports of increase pain at end of session. Continue PT POC as able as patient is progressing to d/c.    Personal Factors and Comorbidities Age;Fitness    Examination-Activity Limitations Locomotion Level;Lift;Stairs;Stand;Squat    Examination-Participation Restrictions Psychiatric nurse Stable/Uncomplicated    Clinical Decision Making Moderate    Rehab Potential Good    PT Frequency 2x / week    PT Duration 8 weeks    PT Treatment/Interventions Cryotherapy;Electrical Stimulation;Moist Heat;Traction;Gait training;Stair training;Functional mobility training;Therapeutic activities;Therapeutic exercise;Balance training;Neuromuscular re-education;Patient/family education;Manual techniques;Taping;Dry needling;Passive range of motion;Joint Manipulations    PT Next Visit Plan Continue LE strengthening hip extension, abduction, and knee extension.    PT Home Exercise Plan mini squat, seated knee ext, hip flex, hip abd    Consulted and Agree with Plan  of Care Patient             Patient will benefit from skilled therapeutic intervention in order to improve the following deficits and impairments:  Decreased mobility, Decreased endurance, Difficulty walking, Decreased activity tolerance, Decreased strength, Pain, Abnormal gait, Decreased balance, Improper body mechanics, Postural dysfunction  Visit Diagnosis: Chronic pain of left knee  Chronic pain of right knee     Problem List Patient Active Problem List   Diagnosis Date Noted   Right shoulder pain 08/04/2020   Bilateral cataracts 01/20/2018   Post-menopausal 01/14/2016   Pre-diabetes 01/06/2015   Bilateral knee pain 01/04/2015   Overweight 01/04/2015   Hyperprolactinemia (HCC) 01/04/2015   Fatigue 01/04/2015   Family history of diabetes mellitus 01/04/2015   Family history of colon cancer 01/04/2015   MURMUR 06/30/2010    Hilda Lias DPT Romilda Joy, SPT  Hilda Lias 11/24/2020, 1:01 PM  [facility] [address] [place], [address] Phone: [phone]   Fax:  [phone]  Name: Krystal Woodward MRN: [id number] Date of Birth: [birth year]

## 2020-11-30 NOTE — Telephone Encounter (Signed)
Left message to call back. Ok for [facility] to advise as below.  

## 2020-12-01 ENCOUNTER — Ambulatory Visit: Payer: No Typology Code available for payment source | Admitting: Physical Therapy

## 2020-12-06 ENCOUNTER — Ambulatory Visit: Payer: No Typology Code available for payment source | Admitting: Physical Therapy

## 2020-12-06 ENCOUNTER — Encounter: Payer: Self-pay | Admitting: Physical Therapy

## 2020-12-06 DIAGNOSIS — G8929 Other chronic pain: Secondary | ICD-10-CM

## 2020-12-06 DIAGNOSIS — M25562 Pain in left knee: Secondary | ICD-10-CM | POA: Diagnosis not present

## 2020-12-06 NOTE — Therapy (Signed)
[facility] [address], [place], [address] Phone: [phone]   Fax:  [phone]  Physical Therapy Treatment  Patient Details  Name: Krystal Woodward MRN: [id number] Date of Birth: [birth year] No data recorded  Encounter Date: 12/06/2020   PT End of Session - 12/06/20 0916     Visit Number 14    Number of Visits 17    Date for PT Re-Evaluation 12/20/20    PT Start Time 0733    PT Stop Time 0815    PT Time Calculation (min) 42 min    Activity Tolerance Patient tolerated treatment well;Patient limited by pain    Behavior During Therapy [facility] for tasks assessed/performed             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   Breast mass 01/24/2011   Chest pain    Elevated liver enzymes    GERD (gastroesophageal reflux disease)    h/o   Hyperprolactinemia (HCC) 5284;1324   Normal MRI   Vitamin D deficiency     Past Surgical History:  Procedure Laterality Date   BREAST BIOPSY Right 06/17/15   Inflammed Fibrocyst   BREAST CYST EXCISION  02/27/2011   Procedure: CYST EXCISION BREAST;  Surgeon: Currie Paris, MD;  Location: [facility];  Service: General;  Laterality: Left;  Needle localization removal left breast mass   BREAST EXCISIONAL BIOPSY Left    BUNIONECTOMY     both feet   COLONOSCOPY WITH PROPOFOL N/A 09/10/2019   Procedure: COLONOSCOPY WITH PROPOFOL;  Surgeon: Toledo, Boykin Nearing, MD;  Location: [facility];  Service: Gastroenterology;  Laterality: N/A;   INCISION AND DRAINAGE ABSCESS Right 07/28/2015   Procedure: INCISION AND DRAINAGE RIGHT BREAST  ABSCESS;  Surgeon: Manus Rudd, MD;  Location: [facility];  Service: General;  Laterality: Right;   SHOULDER ARTHROSCOPY WITH ROTATOR CUFF REPAIR AND OPEN BICEPS TENODESIS  10/10/2018   Procedure: SHOULDER ARTHROSCOPY WITH OPEN ROTATOR CUFF REPAIR AND MINI OPEN BICEPS  TENODESIS;  Surgeon: Christena Flake, MD;  Location: [facility];  Service: Orthopedics;;   SHOULDER ARTHROSCOPY WITH SUBACROMIAL DECOMPRESSION  10/10/2018   Procedure: SHOULDER ARTHROSCOPY WITH SUBACROMIAL DECOMPRESSION;  Surgeon: Christena Flake, MD;  Location: [facility];  Service: Orthopedics;;   TUBAL LIGATION  1996    There were no vitals filed for this visit.   Subjective Assessment - 12/06/20 0741     Subjective Pt reports having bilat knee pain with going down the stairs. She reports her knee pain as a 3/10 on NPRS.    Pertinent History Krystal Woodward is a 65 year old female with complaints of bilateral knee pain with the L knee occasionally buckling with walking, standing, and stair negotiation. Pt states her knee pain became more noticeable on May 21st after standing from a chair. Pt reports her pain today is 3-4/10 but increases and becomes more sharp with previously stated activities. Pt lives in a two story home with stairs to enter and access the 2nd floor. Pt reports having one fall in last six months without neccessary hospitilization. Patient denies any numbness or tingling and any red flags that would prevent physical therapy. Pt has previously had therapy for RCR and was previously given exercises to address knee pain. 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 Pt would like to perform ADLs and return to weight lifting  with less pain. Pt reports being allergic to latex.    Limitations Standing;Lifting;Walking    How long can you sit comfortably? Unlimited    Diagnostic tests XRAY  AP and lateral of the bilateral knees dated today demonstrate mild patellofemoral narrowing. The medial and lateral compartments appear normal. (Aluisio    Patient Stated Goals Patient would like to return to lifting weights and perform stair negotiation with less pain.              Therapeutic Exercise   Nu Step L4 x 5 min >50 SPM   Glute bridges on bosu ball 3 x10 reps  #15DB cues to tighten core and buttocks   STS from 23 in Mat table 3 x10 reps 10DB with cues to correct form; patient to sit bottom    [employer] 2 x 10' GTB cues to remain in squat position and take shorter    Wall sit with Ball hold 3 x 30sec   Stair climbing 2 x 4 6" steps education to not bring knee past toe when climbing up                         PT Education - 12/06/20 0918     Education Details Pt educated on therex form/technique    Person(s) Educated Patient    Methods Explanation;Demonstration    Comprehension Verbalized understanding;Returned demonstration              PT Short Term Goals - 11/16/20 0927       PT SHORT TERM GOAL #1   Title Patient will be independent with home exercise program to improve mobility and self manage symptoms.    Baseline Patient has previously received exercises but has not become independent 11/16/20: reports performing  3x per week    Time 4    Period Weeks    Status Achieved               PT Long Term Goals - 11/16/20 0911       PT LONG TERM GOAL #1   Title Pt will be able to negotiate stairs utilizing reciprocal pattern with reports of pain no greater than 1/10 on NPRS in order to safely enter home and community buildings.    Baseline 10/06/2020: descends stairs with pain 4/10 utilizing lateral step-to pattern. 11/16/20: Pt demonstrated reciprocal gait negotiating steps and reports less than 1/10 without reports of legs giving way.    Time 8    Period Weeks    Status Achieved      PT LONG TERM GOAL #2   Title Pt will report a score of 61 on FOTO in to demonstrate improvement in tolerance for functional acitivies.    Baseline 10/06/20: 42    Time 8    Status Deferred      PT LONG TERM GOAL #3   Title Patient will demonstrate gross lower extremity strength as 5/5 bilaterally in order to perform ADLs and return to prior exercise regimen.    Baseline 10/06/20: gross LE strength 4/4 bilaterally  11/16/20 grossly 4/4 (Hip extension abduction strength focus)    Time 8    Period Weeks    Status On-going      PT LONG TERM GOAL #4   Title Patient will perform 5XSTS within 15 seconds to demonstrate increased LE strength and decrease risk of falling    Baseline 10/11/20 24.22 sec 11/16/20: 20.2 sec    Time 8  Period Weeks    Status On-going      PT LONG TERM GOAL #5   Title Patient will increase comfortable gait speed to 0.80 m/s in order to demonstrate improved ambulation for participation in the community ambulation.    Baseline 10/11/20 (0.6 m/s) self selected speed; 11/16/20:1.25 m/s    Time 8    Period Weeks    Status Achieved                   Plan - 12/06/20 0920     Clinical Impression Statement PT continued LE strengthening progression within limited knee flexion ranges to minimize increase in bilat knee pain. Pt tolerates session well with good carry over in form after multimodal cueing to correct. Continue PT POC. Update and print out HEP next visit.    Personal Factors and Comorbidities Age;Fitness    Examination-Activity Limitations Locomotion Level;Lift;Stairs;Stand;Squat    Examination-Participation Restrictions Community Activity;Cleaning    Stability/Clinical Decision Making Stable/Uncomplicated    Rehab Potential Good    PT Frequency 2x / week    PT Duration 8 weeks    PT Treatment/Interventions Cryotherapy;Electrical Stimulation;Moist Heat;Traction;Gait training;Stair training;Functional mobility training;Therapeutic activities;Therapeutic exercise;Balance training;Neuromuscular re-education;Patient/family education;Manual techniques;Taping;Dry needling;Passive range of motion;Joint Manipulations    PT Next Visit Plan Continue LE strengthening hip extension, abduction, wall sits.    PT Home Exercise Plan mini squat, seated knee ext, hip flex, hip abd, (UPDATE HEP/PRINT)    Consulted and Agree with Plan of Care Patient             Patient will  benefit from skilled therapeutic intervention in order to improve the following deficits and impairments:  Decreased mobility, Decreased endurance, Difficulty walking, Decreased activity tolerance, Decreased strength, Pain, Abnormal gait, Decreased balance, Improper body mechanics, Postural dysfunction  Visit Diagnosis: Chronic pain of left knee  Chronic pain of right knee     Problem List Patient Active Problem List   Diagnosis Date Noted   Right shoulder pain 08/04/2020   Bilateral cataracts 01/20/2018   Post-menopausal 01/14/2016   Pre-diabetes 01/06/2015   Bilateral knee pain 01/04/2015   Overweight 01/04/2015   Hyperprolactinemia (HCC) 01/04/2015   Fatigue 01/04/2015   Family history of diabetes mellitus 01/04/2015   Family history of colon cancer 01/04/2015   MURMUR 06/30/2010     Krystal Woodward DPT Romilda Joy, SPT  Krystal Woodward 12/06/2020, 3:42 PM  [facility] [address], [place], [address] Phone: [phone]   Fax:  [phone]  Name: Krystal Woodward MRN: [id number] Date of Birth: 01-13-1956

## 2020-12-08 ENCOUNTER — Ambulatory Visit: Payer: No Typology Code available for payment source | Admitting: Physical Therapy

## 2020-12-08 ENCOUNTER — Encounter: Payer: Self-pay | Admitting: Physical Therapy

## 2020-12-08 DIAGNOSIS — M25562 Pain in left knee: Secondary | ICD-10-CM

## 2020-12-08 DIAGNOSIS — G8929 Other chronic pain: Secondary | ICD-10-CM

## 2020-12-08 NOTE — Therapy (Signed)
[facility] [facility] [address], [place], [address] Phone: [phone]   Fax:  [phone]  Physical Therapy Treatment  Patient Details  Name: Krystal Woodward MRN: [id number] Date of Birth: 1956/03/20 No data recorded  Encounter Date: 12/08/2020   PT End of Session - 12/08/20 0925     Visit Number 15    Number of Visits 17    Date for PT Re-Evaluation 12/20/20    PT Start Time 0904    PT Stop Time 0945    PT Time Calculation (min) 41 min    Activity Tolerance Patient tolerated treatment well;Patient limited by pain    Behavior During Therapy [facility] for tasks assessed/performed             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   Breast mass 01/24/2011   Chest pain    Elevated liver enzymes    GERD (gastroesophageal reflux disease)    h/o   Hyperprolactinemia (HCC) 0932;3557   Normal MRI   Vitamin D deficiency     Past Surgical History:  Procedure Laterality Date   BREAST BIOPSY Right 06/17/15   Inflammed Fibrocyst   BREAST CYST EXCISION  02/27/2011   Procedure: CYST EXCISION BREAST;  Surgeon: Currie Paris, MD;  Location: [facility];  Service: General;  Laterality: Left;  Needle localization removal left breast mass   BREAST EXCISIONAL BIOPSY Left    BUNIONECTOMY     both feet   COLONOSCOPY WITH PROPOFOL N/A 09/10/2019   Procedure: COLONOSCOPY WITH PROPOFOL;  Surgeon: Toledo, Boykin Nearing, MD;  Location: [facility];  Service: Gastroenterology;  Laterality: N/A;   INCISION AND DRAINAGE ABSCESS Right 07/28/2015   Procedure: INCISION AND DRAINAGE RIGHT BREAST  ABSCESS;  Surgeon: Manus Rudd, MD;  Location: [facility];  Service: General;  Laterality: Right;   SHOULDER ARTHROSCOPY WITH ROTATOR CUFF REPAIR AND OPEN BICEPS TENODESIS  10/10/2018   Procedure: SHOULDER ARTHROSCOPY WITH OPEN ROTATOR CUFF REPAIR AND MINI OPEN BICEPS  TENODESIS;  Surgeon: Christena Flake, MD;  Location: [facility];  Service: Orthopedics;;   SHOULDER ARTHROSCOPY WITH SUBACROMIAL DECOMPRESSION  10/10/2018   Procedure: SHOULDER ARTHROSCOPY WITH SUBACROMIAL DECOMPRESSION;  Surgeon: Christena Flake, MD;  Location: [facility];  Service: Orthopedics;;   TUBAL LIGATION  1996    There were no vitals filed for this visit.   Subjective Assessment - 12/08/20 0912     Subjective Pt reports having bilat knee pain with going down the stairs. She reports her knee pain as a 4/10 on NPRS.    Pertinent History Krystal Woodward is a 65 year old female with complaints of bilateral knee pain with the L knee occasionally buckling with walking, standing, and stair negotiation. Pt states her knee pain became more noticeable on May 21st after standing from a chair. Pt reports her pain today is 3-4/10 but increases and becomes more sharp with previously stated activities. Pt lives in a two story home with stairs to enter and access the 2nd floor. Pt reports having one fall in last six months without neccessary hospitilization. Patient denies any numbness or tingling and any red flags that would prevent physical therapy. Pt has previously had therapy for RCR and was previously given exercises to address knee pain. 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 Pt would like to perform ADLs and return to weight lifting  with less pain. Pt reports being allergic to latex.    Limitations Standing;Lifting;Walking    Diagnostic tests XRAY  AP and lateral of the bilateral knees dated today demonstrate mild patellofemoral narrowing. The medial and lateral compartments appear normal. (Aluisio    Patient Stated Goals Patient would like to return to lifting weights and perform stair negotiation with less pain.              Therapeutic Exercise  Nu Step L4 x 5 min  Sit to Stand with physioball overhead 2 x 12   Side Lying hip abduction 2 x 10 reps  Clamshells with  YTB 2 x 10 reps   Wall Sit 2 x 60 secs with     Ther-Ex PT reviewed the following HEP with patient with patient able to demonstrate a set of the following with min cuing for correction needed. PT educated patient on parameters of therex (how/when to inc/decrease intensity, frequency, rep/set range, stretch hold time, and purpose of therex) with verbalized understanding.  Access Code: [email or web address]                     PT Education - 12/08/20 1330     Education Details therex form/technique    Person(s) Educated Patient    Methods Explanation;Demonstration;Verbal cues    Comprehension Verbalized understanding;Verbal cues required;Returned demonstration              PT Short Term Goals - 11/16/20 0927       PT SHORT TERM GOAL #1   Title Patient will be independent with home exercise program to improve mobility and self manage symptoms.    Baseline Patient has previously received exercises but has not become independent 11/16/20: reports performing  3x per week    Time 4    Period Weeks    Status Achieved               PT Long Term Goals - 11/16/20 0911       PT LONG TERM GOAL #1   Title Pt will be able to negotiate stairs utilizing reciprocal pattern with reports of pain no greater than 1/10 on NPRS in order to safely enter home and community buildings.    Baseline 10/06/2020: descends stairs with pain 4/10 utilizing lateral step-to pattern. 11/16/20: Pt demonstrated reciprocal gait negotiating steps and reports less than 1/10 without reports of legs giving way.    Time 8    Period Weeks    Status Achieved      PT LONG TERM GOAL #2   Title Pt will report a score of 61 on FOTO in to demonstrate improvement in tolerance for functional acitivies.    Baseline 10/06/20: 42    Time 8    Status Deferred      PT LONG TERM GOAL #3   Title Patient will demonstrate gross lower extremity strength as 5/5 bilaterally in order to perform ADLs and return to prior  exercise regimen.    Baseline 10/06/20: gross LE strength 4/4 bilaterally 11/16/20 grossly 4/4 (Hip extension abduction strength focus)    Time 8    Period Weeks    Status On-going      PT LONG TERM GOAL #4   Title Patient will perform 5XSTS within 15 seconds to demonstrate increased LE strength and decrease risk of falling    Baseline 10/11/20 24.22 sec 11/16/20: 20.2 sec    Time 8    Period Weeks    Status On-going  PT LONG TERM GOAL #5   Title Patient will increase comfortable gait speed to 0.80 m/s in order to demonstrate improved ambulation for participation in the community ambulation.    Baseline 10/11/20 (0.6 m/s) self selected speed; 11/16/20:1.25 m/s    Time 8    Period Weeks    Status Achieved                   Plan - 12/08/20 1103     Clinical Impression Statement PT continued LE strengthening progression within limited knee flexion ranges to minimize increase in bilat knee pain. Pt tolerated session well with good carry over in form after multimodal cueing to correct. PT updated HEP and reviewed parameters of therex (how/when to inc/decrease intensity, frequency, rep/set range, stretch hold time, and purpose of therex) with verbalized understanding. Plan to discharge next session.    Personal Factors and Comorbidities Age;Fitness    Examination-Activity Limitations Locomotion Level;Lift;Stairs;Stand;Squat    Examination-Participation Restrictions Community Activity;Cleaning    Rehab Potential Good    PT Frequency 2x / week    PT Duration 8 weeks    PT Treatment/Interventions Cryotherapy;Electrical Stimulation;Moist Heat;Traction;Gait training;Stair training;Functional mobility training;Therapeutic activities;Therapeutic exercise;Balance training;Neuromuscular re-education;Patient/family education;Manual techniques;Taping;Dry needling;Passive range of motion;Joint Manipulations    PT Next Visit Plan DC next visit    PT Home Exercise Plan Access Code: [email or web address]              Patient will benefit from skilled therapeutic intervention in order to improve the following deficits and impairments:  Decreased mobility, Decreased endurance, Difficulty walking, Decreased activity tolerance, Decreased strength, Pain, Abnormal gait, Decreased balance, Improper body mechanics, Postural dysfunction  Visit Diagnosis: Chronic pain of left knee  Chronic pain of right knee     Problem List Patient Active Problem List   Diagnosis Date Noted   Right shoulder pain 08/04/2020   Bilateral cataracts 01/20/2018   Post-menopausal 01/14/2016   Pre-diabetes 01/06/2015   Bilateral knee pain 01/04/2015   Overweight 01/04/2015   Hyperprolactinemia (HCC) 01/04/2015   Fatigue 01/04/2015   Family history of diabetes mellitus 01/04/2015   Family history of colon cancer 01/04/2015   MURMUR 06/30/2010    Hilda Lias DPT Romilda Joy, SPT  Hilda Lias 12/08/2020, 1:33 PM  [place] [facility] [address] [place], [address] Phone: [phone]   Fax:  [phone]  Name: Tesa Meadors MRN: [id number] Date of Birth: 1956-03-16

## 2020-12-13 ENCOUNTER — Encounter: Payer: Self-pay | Admitting: Physical Therapy

## 2020-12-13 ENCOUNTER — Ambulatory Visit: Payer: No Typology Code available for payment source | Admitting: Physical Therapy

## 2020-12-13 DIAGNOSIS — M25562 Pain in left knee: Secondary | ICD-10-CM | POA: Diagnosis not present

## 2020-12-13 DIAGNOSIS — G8929 Other chronic pain: Secondary | ICD-10-CM

## 2020-12-13 DIAGNOSIS — M25561 Pain in right knee: Secondary | ICD-10-CM

## 2020-12-13 NOTE — Therapy (Signed)
[place] [facility] [address], [place], [address] Phone: [phone]   Fax:  [phone]  Physical Therapy Treatment Discharge Summary  Reporting Period 10/06/20-12/13/20  Patient Details  Name: Krystal Woodward MRN: [id number] Date of Birth: Sep 13, 1955 No data recorded  Encounter Date: 12/13/2020   PT End of Session - 12/13/20 0918     Visit Number 16    Number of Visits 17    Date for PT Re-Evaluation 12/20/20    PT Start Time 0904    PT Stop Time 0929    PT Time Calculation (min) 25 min    Activity Tolerance Patient tolerated treatment well    Behavior During Therapy [facility] for tasks assessed/performed             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   Breast mass 01/24/2011   Chest pain    Elevated liver enzymes    GERD (gastroesophageal reflux disease)    h/o   Hyperprolactinemia ([place]) 0940;7680   Normal MRI   Vitamin D deficiency     Past Surgical History:  Procedure Laterality Date   BREAST BIOPSY Right 06/17/15   Inflammed Fibrocyst   BREAST CYST EXCISION  02/27/2011   Procedure: CYST EXCISION BREAST;  Surgeon: Haywood Lasso, MD;  Location: [place];  Service: General;  Laterality: Left;  Needle localization removal left breast mass   BREAST EXCISIONAL BIOPSY Left    BUNIONECTOMY     both feet   COLONOSCOPY WITH PROPOFOL N/A 09/10/2019   Procedure: COLONOSCOPY WITH PROPOFOL;  Surgeon: Toledo, Benay Pike, MD;  Location: [facility];  Service: Gastroenterology;  Laterality: N/A;   INCISION AND DRAINAGE ABSCESS Right 07/28/2015   Procedure: INCISION AND DRAINAGE RIGHT BREAST  ABSCESS;  Surgeon: Donnie Mesa, MD;  Location: [place];  Service: General;  Laterality: Right;   SHOULDER ARTHROSCOPY WITH ROTATOR CUFF REPAIR AND OPEN BICEPS TENODESIS  10/10/2018   Procedure: SHOULDER ARTHROSCOPY WITH OPEN ROTATOR CUFF  REPAIR AND MINI OPEN BICEPS TENODESIS;  Surgeon: Corky Mull, MD;  Location: [facility];  Service: Orthopedics;;   SHOULDER ARTHROSCOPY WITH SUBACROMIAL DECOMPRESSION  10/10/2018   Procedure: SHOULDER ARTHROSCOPY WITH SUBACROMIAL DECOMPRESSION;  Surgeon: Corky Mull, MD;  Location: [facility];  Service: Orthopedics;;   TUBAL LIGATION  1996    There were no vitals filed for this visit.   Subjective Assessment - 12/13/20 0923     Subjective Pt denies any pain today in bilat knee. She reports losing her HEP handout from last visit.    Pertinent History Krystal Woodward is a 65 year old female with complaints of bilateral knee pain with the L knee occasionally buckling with walking, standing, and stair negotiation. Pt states her knee pain became more noticeable on May 21st after standing from a chair. Pt reports her pain today is 3-4/10 but increases and becomes more sharp with previously stated activities. Pt lives in a two story home with stairs to enter and access the 2nd floor. Pt reports having one fall in last six months without neccessary hospitilization. Patient denies any numbness or tingling and any red flags that would prevent physical therapy. Pt has previously had therapy for RCR and was previously given exercises to address knee pain. 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 Pt would like to perform ADLs and return to weight lifting with  less pain. Pt reports being allergic to latex.    Limitations Standing;Lifting;Walking    How long can you sit comfortably? Unlimited    Diagnostic tests XRAY  AP and lateral of the bilateral knees dated today demonstrate mild patellofemoral narrowing. The medial and lateral compartments appear normal. (Aluisio    Patient Stated Goals Patient would like to return to lifting weights and perform stair negotiation with less pain.              Therex   Access Code: [email or web address]  Patient educated on walking program and continuing to  increase walking duration and intensity incrementally as to minimize the increase in bilateral knee pain.   Ther-Ex PT reviewed the following HEP with patient with patient able to demonstrate a set of the following with min cuing for correction needed. PT educated patient on parameters of therex (how/when to inc/decrease intensity, frequency, rep/set range, stretch hold time, and purpose of therex) with verbalized understanding.  Physical Performance   MMT: grossly 4+/5 LEs 5XSTS: 13.91sec Stair negotiation: reciprocal gait, single UE on hand rail, with reports of 0/10 pain on NPRS      *Pt appropriate for discharge this date. Pt has demonstrated independence with HEP and has expressed confidence in continuing exercises independently. Pt has achieved most goals, see clinical impression for details.                  PT Education - 12/13/20 0930     Education Details Pt educated in progress to date and HEP frequency, intensity, and reps/sets.    Person(s) Educated Patient    Methods Explanation;Demonstration    Comprehension Verbalized understanding;Returned demonstration              PT Short Term Goals - 12/13/20 0947       PT SHORT TERM GOAL #1   Title Patient will be independent with home exercise program to improve mobility and self manage symptoms.    Baseline Patient has previously received exercises but has not become independent 11/16/20: reports performing  3x per week    Time 4    Period Weeks    Status Achieved               PT Long Term Goals - 12/13/20 0905       PT LONG TERM GOAL #1   Title Pt will be able to negotiate stairs utilizing reciprocal pattern with reports of pain no greater than 1/10 on NPRS in order to safely enter home and community buildings.    Baseline 10/06/2020: descends stairs with pain 4/10 utilizing lateral step-to pattern. 7/26/2/2: Pt demonstrated reciprocal gait negotiating steps and reports less than 1/10 without  reports of legs giving way. 8/22 no pain or giving way    Time 8    Period Weeks    Status Achieved      PT LONG TERM GOAL #2   Title Pt will report a score of 61 on FOTO in to demonstrate improvement in tolerance for functional acitivies.    Baseline 10/06/20: 42 8/22: 58    Time 8    Period Weeks    Status Not Met      PT LONG TERM GOAL #3   Title Patient will demonstrate gross lower extremity strength as 5/5 bilaterally in order to perform ADLs and return to prior exercise regimen.    Baseline 10/06/20: gross LE strength 4/4 bilaterally 11/16/20 grossly 4/4 (Hip extension abduction strength focus) 8/22: 4+/5 grossly  Time 8    Period Weeks    Status Not Met      PT LONG TERM GOAL #4   Title Patient will perform 5XSTS within 15 seconds to demonstrate increased LE strength and decrease risk of falling    Baseline 10/11/20 24.22 sec 11/16/20: 20.2 sec 8/22: 13.9 sec    Time 8    Period Weeks    Status Achieved      PT LONG TERM GOAL #5   Title Patient will increase comfortable gait speed to 0.80 m/s in order to demonstrate improved ambulation for participation in the community ambulation.    Baseline 10/11/20 (0.6 m/s) self selected speed; 11/16/20:1.25 m/s    Time 8    Period Weeks    Status Achieved                   Plan - 12/13/20 0935     Clinical Impression Statement Pt appropriate for discharge this date. Pt has demonstrated improved LE strength and decreased bilateral knee pain with sit<>stand transfers and reciprocal stair negotiation. Pt has demostrated independence with HEP and is confident with continuing LE strengthening at home. PT answered any questions regarding form/technique, intensity, and reps/frequency of HEP. End PT POC for this episode.    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    Clinical Decision Making Moderate    Rehab Potential Good    PT Frequency 2x / week    PT Duration 8 weeks    PT Treatment/Interventions Cryotherapy;Electrical Stimulation;Moist Heat;Traction;Gait training;Stair training;Functional mobility training;Therapeutic activities;Therapeutic exercise;Balance training;Neuromuscular re-education;Patient/family education;Manual techniques;Taping;Dry needling;Passive range of motion;Joint Manipulations    PT Next Visit Plan END POC    PT Home Exercise Plan Access Code: [email or web address]    Consulted and Agree with Plan of Care Patient             Patient will benefit from skilled therapeutic intervention in order to improve the following deficits and impairments:  Decreased mobility, Decreased endurance, Difficulty walking, Decreased activity tolerance, Decreased strength, Pain, Abnormal gait, Decreased balance, Improper body mechanics, Postural dysfunction  Visit Diagnosis: Chronic pain of left knee  Chronic pain of right knee     Problem List Patient Active Problem List   Diagnosis Date Noted   Right shoulder pain 08/04/2020   Bilateral cataracts 01/20/2018   Post-menopausal 01/14/2016   Pre-diabetes 01/06/2015   Bilateral knee pain 01/04/2015   Overweight 01/04/2015   Hyperprolactinemia ([place]) 01/04/2015   Fatigue 01/04/2015   Family history of diabetes mellitus 01/04/2015   Family history of colon cancer 01/04/2015   MURMUR 06/30/2010     Durwin Reges DPT Sharion Settler, SPT  Durwin Reges 12/13/2020, 2:49 PM  [place] [facility] [address] [place], [address] Phone: [phone]   Fax:  [phone]  Name: Krystal Woodward MRN: [id number] Date of Birth: December 29, 1955

## 2020-12-14 ENCOUNTER — Ambulatory Visit: Payer: No Typology Code available for payment source | Admitting: Physical Therapy

## 2020-12-15 ENCOUNTER — Encounter: Payer: No Typology Code available for payment source | Admitting: Physical Therapy

## 2020-12-16 ENCOUNTER — Encounter: Payer: No Typology Code available for payment source | Admitting: Physical Therapy

## 2020-12-21 ENCOUNTER — Encounter: Payer: No Typology Code available for payment source | Admitting: Physical Therapy

## 2020-12-23 ENCOUNTER — Encounter: Payer: No Typology Code available for payment source | Admitting: Physical Therapy

## 2021-01-06 ENCOUNTER — Other Ambulatory Visit: Payer: Self-pay | Admitting: Family Medicine

## 2021-01-06 NOTE — Telephone Encounter (Signed)
Requested medication (s) are due for refill today: yes  Requested medication (s) are on the active medication list: yes  Last refill:  11/04/20 #60 0 refill  Future visit scheduled: no  Notes to clinic:  not delegated per protocol.. no valid encounter within 3 months . No future appt scheduled at this time .     Requested Prescriptions  Pending Prescriptions Disp Refills   amphetamine-dextroamphetamine (ADDERALL) 5 MG tablet 60 tablet 0    Sig: Take 1 tablet (5 mg total) by mouth 2 (two) times daily.     Not Delegated - Psychiatry:  Stimulants/ADHD Failed - 01/06/2021  1:32 PM      Failed - This refill cannot be delegated      Failed - Urine Drug Screen completed in last 360 days      Failed - Valid encounter within last 3 months    Recent Outpatient Visits           4 months ago Left flank pain   [facility] Malva Limes, MD   4 months ago Left foot pain   [facility] Maple Hudson., MD   5 months ago Annual physical exam   [facility] Malva Limes, MD   5 months ago Attention deficit disorder (ADD) without hyperactivity   [facility] Malva Limes, MD   6 months ago Chronic left-sided low back pain without sciatica   [facility] Osvaldo Angst M, [place]

## 2021-01-06 NOTE — Telephone Encounter (Signed)
Pt called in stating she had been waiting for a call back to see about getting a MRI on her side(hip) and wanted to reach back out to see what can be done. Please advise.

## 2021-01-06 NOTE — Telephone Encounter (Signed)
Medication Refill - Medication:  amphetamine-dextroamphetamine (ADDERALL) 5 MG tablet  Has the patient contacted their pharmacy? No.  Preferred Pharmacy (with phone number or street name):  [facility] [address] [place], [place] - [address]  Phone:  [phone] Fax:  [phone]  Agent: Please be advised that RX refills may take up to 3 business days. We ask that you follow-up with your pharmacy.

## 2021-01-07 MED ORDER — AMPHETAMINE-DEXTROAMPHETAMINE 5 MG PO TABS: 5.0000 mg | ORAL_TABLET | Freq: Two times a day (BID) | ORAL | 0 refills | Status: DC

## 2021-01-07 NOTE — Telephone Encounter (Signed)
LOV: 08/23/2020 (Pt was supposed to follow up in a month but no showed her appointment.  NOV: None   Last Refill 11/04/2020

## 2021-01-10 ENCOUNTER — Other Ambulatory Visit: Payer: Self-pay

## 2021-01-10 MED ORDER — FLUARIX QUADRIVALENT 0.5 ML IM SUSY
PREFILLED_SYRINGE | INTRAMUSCULAR | 0 refills | Status: DC
  Filled 2021-01-10: qty 0.5, 1d supply, fill #0

## 2021-01-10 MED ORDER — AMPHETAMINE-DEXTROAMPHETAMINE 5 MG PO TABS: 5.0000 mg | ORAL_TABLET | Freq: Two times a day (BID) | ORAL | 0 refills | Status: DC

## 2021-01-10 NOTE — Telephone Encounter (Signed)
I have left several messages starting in May and including today 01/10/21 for pt to contact me at my direct number to get MRI scheduled

## 2021-01-10 NOTE — Telephone Encounter (Signed)
Pt stated she needs her Rx    amphetamine-dextroamphetamine (ADDERALL) 5 MG tablet  sent to, not [facility]  [address] [place] [address]  Phone: [phone] Fax: [phone]

## 2021-01-10 NOTE — Addendum Note (Signed)
Addended by: Kavin Leech E on: 01/10/2021 03:50 PM   Modules accepted: Orders

## 2021-01-11 ENCOUNTER — Other Ambulatory Visit: Payer: Self-pay | Admitting: Family Medicine

## 2021-01-11 ENCOUNTER — Other Ambulatory Visit: Payer: Self-pay

## 2021-01-11 MED ORDER — AMPHETAMINE-DEXTROAMPHETAMINE 5 MG PO TABS
5.0000 mg | ORAL_TABLET | Freq: Two times a day (BID) | ORAL | 0 refills | Status: DC
  Filled 2021-01-11: qty 60, 30d supply, fill #0

## 2021-01-11 NOTE — Telephone Encounter (Signed)
Due to pts insurance  for now on her refills for amphetamine-dextroamphetamine (ADDERALL) 5 MG tablet have to go to   [facility] Phone:  [phone]  Fax:  [phone]    Please call pt when the Rx has been sent to the correct pharmacy

## 2021-01-13 ENCOUNTER — Other Ambulatory Visit: Payer: Self-pay

## 2021-02-15 ENCOUNTER — Other Ambulatory Visit: Payer: Self-pay

## 2021-02-28 ENCOUNTER — Other Ambulatory Visit: Payer: Self-pay

## 2021-02-28 ENCOUNTER — Other Ambulatory Visit: Payer: Self-pay | Admitting: Family Medicine

## 2021-02-28 MED ORDER — AMPHETAMINE-DEXTROAMPHETAMINE 5 MG PO TABS
5.0000 mg | ORAL_TABLET | Freq: Two times a day (BID) | ORAL | 0 refills | Status: DC
  Filled 2021-02-28: qty 60, 30d supply, fill #0

## 2021-02-28 NOTE — Telephone Encounter (Signed)
LOV:  08/23/2020  Thanks,   -Vernona Rieger

## 2021-02-28 NOTE — Telephone Encounter (Signed)
Medication Refill - Medication:amphetamine-dextroamphetamine (ADDERALL) 5 MG  Has the patient contacted their pharmacy? yes (Agent: If no, request that the patient contact the pharmacy for the refill. If patient does not wish to contact the pharmacy document the reason why and proceed with request.) (Agent: If yes, when and what did the pharmacy advise?)contact pcp  Preferred Pharmacy (with phone number or street name):  [facility] Phone:  [phone]  Fax:  [phone]     Has the patient been seen for an appointment in the last year OR does the patient have an upcoming appointment? yes  Agent: Please be advised that RX refills may take up to 3 business days. We ask that you follow-up with your pharmacy.

## 2021-02-28 NOTE — Telephone Encounter (Signed)
Requested medication (s) are due for refill today: Yes  Requested medication (s) are on the active medication list: yes  Last refill:  01/11/2021 #60/0RF  Future visit scheduled: No  Notes to clinic:  Unable to refill per protocol, cannot delegate.     Requested Prescriptions  Pending Prescriptions Disp Refills   amphetamine-dextroamphetamine (ADDERALL) 5 MG tablet 60 tablet 0    Sig: Take 1 tablet (5 mg total) by mouth 2 (two) times daily.     Not Delegated - Psychiatry:  Stimulants/ADHD Failed - 02/28/2021 11:49 AM      Failed - This refill cannot be delegated      Failed - Urine Drug Screen completed in last 360 days      Failed - Valid encounter within last 3 months    Recent Outpatient Visits           6 months ago Left flank pain   [facility] Malva Limes, MD   6 months ago Left foot pain   [facility] Maple Hudson., MD   6 months ago Annual physical exam   [facility] Malva Limes, MD   7 months ago Attention deficit disorder (ADD) without hyperactivity   [facility] Malva Limes, MD   8 months ago Chronic left-sided low back pain without sciatica   [facility] Osvaldo Angst M, [place]

## 2021-03-01 ENCOUNTER — Other Ambulatory Visit: Payer: Self-pay

## 2021-04-19 ENCOUNTER — Other Ambulatory Visit: Payer: Self-pay | Admitting: Family Medicine

## 2021-04-19 DIAGNOSIS — Z1231 Encounter for screening mammogram for malignant neoplasm of breast: Secondary | ICD-10-CM

## 2021-04-19 NOTE — Telephone Encounter (Signed)
Copied from CRM [id number]. Topic: Quick Communication - Rx Refill/Question >> Apr 19, 2021  3:48 PM Marylen Ponto wrote: Medication: amphetamine-dextroamphetamine (ADDERALL) 5 MG tablet  Has the patient contacted their pharmacy? No. Pt advised previously to contact provider (Agent: If no, request that the patient contact the pharmacy for the refill. If patient does not wish to contact the pharmacy document the reason why and proceed with request.) (Agent: If yes, when and what did the pharmacy advise?)  Preferred Pharmacy (with phone number or street name): [facility]  Phone: [phone] Fax: [phone]  Has the patient been seen for an appointment in the last year OR does the patient have an upcoming appointment? Yes.    Agent: Please be advised that RX refills may take up to 3 business days. We ask that you follow-up with your pharmacy.

## 2021-04-19 NOTE — Telephone Encounter (Signed)
Requested medication (s) are due for refill today: yes  Requested medication (s) are on the active medication list: yes  Last refill:  02/28/21 #60  Future visit scheduled: yes  Notes to clinic:  Please review for refill. Refill not delegated per protocol    Requested Prescriptions  Pending Prescriptions Disp Refills   amphetamine-dextroamphetamine (ADDERALL) 5 MG tablet 60 tablet 0    Sig: Take 1 tablet (5 mg total) by mouth 2 (two) times daily.     Not Delegated - Psychiatry:  Stimulants/ADHD Failed - 04/19/2021  4:57 PM      Failed - This refill cannot be delegated      Failed - Urine Drug Screen completed in last 360 days      Failed - Valid encounter within last 3 months    Recent Outpatient Visits           7 months ago Left flank pain   [facility] Malva Limes, MD   8 months ago Left foot pain   [facility] Maple Hudson., MD   8 months ago Annual physical exam   [facility] Malva Limes, MD   8 months ago Attention deficit disorder (ADD) without hyperactivity   [facility] Malva Limes, MD   9 months ago Chronic left-sided low back pain without sciatica   [facility] Trey Sailors, [place]       Future Appointments             In 2 months Fisher, Demetrios Isaacs, MD [facility]

## 2021-04-20 ENCOUNTER — Other Ambulatory Visit: Payer: Self-pay

## 2021-04-20 MED ORDER — AMPHETAMINE-DEXTROAMPHETAMINE 5 MG PO TABS
5.0000 mg | ORAL_TABLET | Freq: Two times a day (BID) | ORAL | 0 refills | Status: DC
  Filled 2021-04-20: qty 60, 30d supply, fill #0

## 2021-05-20 ENCOUNTER — Ambulatory Visit
Admission: RE | Admit: 2021-05-20 | Discharge: 2021-05-20 | Disposition: A | Discharge status: Home / Self Care | Payer: No Typology Code available for payment source | Source: Ambulatory Visit | Attending: Family Medicine | Admitting: Family Medicine

## 2021-05-20 DIAGNOSIS — Z1231 Encounter for screening mammogram for malignant neoplasm of breast: Secondary | ICD-10-CM

## 2021-06-08 ENCOUNTER — Other Ambulatory Visit: Payer: Self-pay | Admitting: Family Medicine

## 2021-06-08 NOTE — Telephone Encounter (Signed)
Medication Refill - Medication:  amphetamine-dextroamphetamine (ADDERALL) 5 MG tablet 60 tablet 0 04/20/2021    Sig - Route: Take 1 tablet (5 mg total) by mouth 2 (two) times daily. - Oral   Sent to pharmacy as: amphetamine-dextroamphetamine (ADDERALL) 5 MG tablet   Earliest Fill Date: 04/20/2021    Pharmacy  [facility]    Has the patient contacted their pharmacy? Yes.   (Agent: If no, request that the patient contact the pharmacy for the refill. If patient does not wish to contact the pharmacy document the reason why and proceed with request.) (Agent: If yes, when and what did the pharmacy advise?)  Preferred Pharmacy (with phone number or street name):  [facility]  [address] [place] [address]  Phone: [phone] Fax: [phone]   Has the patient been seen for an appointment in the last year OR does the patient have an upcoming appointment? Yes.  07/11/2021  Agent: Please be advised that RX refills may take up to 3 business days. We ask that you follow-up with your pharmacy.

## 2021-06-08 NOTE — Telephone Encounter (Signed)
Requested medications are due for refill today.  yes  Requested medications are on the active medications list.  yes  Last refill. 04/20/2021 #60 /0 refills  Future visit scheduled.   yes  Notes to clinic.  Medication not delegated.    Requested Prescriptions  Pending Prescriptions Disp Refills   amphetamine-dextroamphetamine (ADDERALL) 5 MG tablet 60 tablet 0    Sig: Take 1 tablet (5 mg total) by mouth 2 (two) times daily.     Not Delegated - Psychiatry:  Stimulants/ADHD Failed - 06/08/2021  5:08 PM      Failed - This refill cannot be delegated      Failed - Urine Drug Screen completed in last 360 days      Failed - Valid encounter within last 6 months    Recent Outpatient Visits           9 months ago Left flank pain   [facility] Malva Limes, MD   9 months ago Left foot pain   [facility] Maple Hudson., MD   10 months ago Annual physical exam   [facility] Malva Limes, MD   10 months ago Attention deficit disorder (ADD) without hyperactivity   [facility] Malva Limes, MD   11 months ago Chronic left-sided low back pain without sciatica   [facility] Trey Sailors, [place]       Future Appointments             In 1 month Fisher, Demetrios Isaacs, MD [facility]            Passed - Last BP in normal range    BP Readings from Last 1 Encounters:  08/23/20 110/68          Passed - Last Heart Rate in normal range    Pulse Readings from Last 1 Encounters:  08/23/20 (!) 58

## 2021-06-09 ENCOUNTER — Other Ambulatory Visit: Payer: Self-pay

## 2021-06-09 MED ORDER — AMPHETAMINE-DEXTROAMPHETAMINE 5 MG PO TABS
5.0000 mg | ORAL_TABLET | Freq: Two times a day (BID) | ORAL | 0 refills | Status: DC
  Filled 2021-06-09: qty 60, 30d supply, fill #0

## 2021-06-13 ENCOUNTER — Other Ambulatory Visit: Payer: Self-pay

## 2021-07-11 ENCOUNTER — Ambulatory Visit (INDEPENDENT_AMBULATORY_CARE_PROVIDER_SITE_OTHER): Payer: No Typology Code available for payment source | Admitting: Family Medicine

## 2021-07-11 ENCOUNTER — Other Ambulatory Visit: Payer: Self-pay

## 2021-07-11 ENCOUNTER — Encounter: Payer: Self-pay | Admitting: Family Medicine

## 2021-07-11 VITALS — BP 124/80 | HR 64 | Ht 62.0 in | Wt 168.6 lb

## 2021-07-11 DIAGNOSIS — F988 Other specified behavioral and emotional disorders with onset usually occurring in childhood and adolescence: Secondary | ICD-10-CM

## 2021-07-11 DIAGNOSIS — R109 Unspecified abdominal pain: Secondary | ICD-10-CM

## 2021-07-11 DIAGNOSIS — R7303 Prediabetes: Secondary | ICD-10-CM | POA: Diagnosis not present

## 2021-07-11 LAB — POCT GLYCOSYLATED HEMOGLOBIN (HGB A1C)
Est. average glucose Bld gHb Est-mCnc: 108
Hemoglobin A1C: 5.4 % (ref 4.0–5.6)

## 2021-07-11 MED ORDER — AMPHETAMINE-DEXTROAMPHET ER 20 MG PO CP24
20.0000 mg | ORAL_CAPSULE | Freq: Every day | ORAL | 0 refills | Status: DC
  Filled 2021-07-11: qty 30, 30d supply, fill #0

## 2021-07-11 NOTE — Progress Notes (Signed)
I,Krystal Woodward,acting as a Neurosurgeon for Krystal Merry, MD.,have documented all relevant documentation on the behalf of Krystal Merry, MD,as directed by  Krystal Merry, MD while in the presence of Krystal Merry, MD.   Established patient visit   Patient: Krystal Woodward   DOB: 1955-06-18   66 y.o. Female  MRN: [id number] Visit Date: 07/11/2021  Today's healthcare provider: [name], MD   Chief Complaint  Patient presents with   Prediabetes   Follow-up   Subjective    HPI   Flank pain-Reports side has been hurting really bad and it comes and goes in lower left side from front to back. Has been occurring for the last year. She had pelvic u/s last April which was unremarkable, and MRI was ordered at her request, but she was apparently never contacted to schedule MRI. Has not changed at all in character, please seen o.n from 07/01/20 and 07/26/20 for description of pain.   Follow up on ADD, last seen Mayl of last year and changed form atomoxetine to Adderall 5 BID which she has been tolerating well and is helping a little bit, but she still struggles to stay on task and focusing. She would like to increase adderrall and prefers to take something once daily.    Prediabetes, Follow-up  Lab Results  Component Value Date   HGBA1C 5.7 (H) 08/19/2020   HGBA1C 5.8 (A) 01/21/2019   HGBA1C 5.8 (H) 01/18/2018   GLUCOSE 87 08/19/2020   GLUCOSE 88 01/21/2019   GLUCOSE 101 (H) 10/07/2018    Last seen for for this11 months ago.  Management since that visit includes continuing lifestyle modifications. Current symptoms include none and have been stable.  Prior visit with dietician: no Current diet: well balanced Current exercise:  stairmaster and weights at least 5xs weekly for a hour and a half.  -Eye Exam August 2022 and February 2023 and due to go back in June  Pertinent Labs:    Component Value Date/Time   CHOL 153 08/19/2020 1307   CHOL 135 01/21/2019 1059   TRIG 50  08/19/2020 1307   CHOLHDL 3.6 08/19/2020 1307   CREATININE 0.92 08/19/2020 1307    Wt Readings from Last 3 Encounters:  07/11/21 168 lb 9.6 oz (76.5 kg)  08/23/20 168 lb 9.6 oz (76.5 kg)  08/13/20 168 lb (76.2 kg)    -----------------------------------------------------------------------------------------   Medications: Outpatient Medications Prior to Visit  Medication Sig   amphetamine-dextroamphetamine (ADDERALL) 5 MG tablet Take 1 tablet (5 mg total) by mouth 2 (two) times daily.   aspirin 81 MG tablet Take 81 mg by mouth daily.   Cholecalciferol 125 MCG (5000 UT) capsule Take 10,000 Units by mouth daily.   diclofenac Sodium (VOLTAREN) 1 % GEL Apply 2 g topically 4 (four) times daily.   influenza vac split quadrivalent PF (FLUARIX QUADRIVALENT) 0.5 ML injection Inject into the muscle.   Multiple Vitamins-Minerals (CENTRUM SILVER ULTRA WOMENS PO) Take 1 capsule by mouth daily.   Omega-3 Fatty Acids (FISH OIL PO) Take 1,500 Units by mouth daily.   TURMERIC CURCUMIN PO Take 1 tablet by mouth daily.   acetaminophen (TYLENOL) 500 MG tablet Take 1,000 mg by mouth every 8 (eight) hours as needed (pain). (Patient not taking: Reported on 07/11/2021)   cyclobenzaprine (FLEXERIL) 5 MG tablet Take 1 tablet by mouth three times daily as needed for muscle spasm (Patient not taking: Reported on 07/11/2021)   ibuprofen (ADVIL) 200 MG tablet Take 600-800 mg by mouth every  8 (eight) hours as needed (pain). (Patient not taking: Reported on 07/11/2021)   lubiprostone (AMITIZA) 24 MCG capsule Take 1 capsule (24 mcg total) by mouth 2 (two) times daily with a meal. (Patient not taking: Reported on 07/11/2021)   No facility-administered medications prior to visit.    Review of Systems  Constitutional:  Negative for appetite change, chills, fatigue and fever.  Respiratory:  Negative for chest tightness and shortness of breath.   Cardiovascular:  Negative for chest pain and palpitations.  Gastrointestinal:   Negative for abdominal pain, nausea and vomiting.       Left flank pain  Neurological:  Negative for dizziness and weakness.      Objective    BP 124/80 (BP Location: Left Arm, Patient Position: Sitting, Cuff Size: Normal)   Pulse 64   Ht 5\' 2"  (1.575 m)   Wt 168 lb 9.6 oz (76.5 kg)   SpO2 100%   BMI 30.84 kg/m    Physical Exam   No abdominal masses or swelling. Mild tenderness left lower and mid abdomen and mild left CVAT.     Assessment & Plan  1. Attention deficit disorder (ADD) without hyperactivity Doing better on 5 BID. But not controlled and she prefers once daily dose. Change to amphetamine-dextroamphetamine (ADDERALL XR) 20 MG 24 hr capsule; Take 1 capsule (20 mg total) by mouth daily.  Dispense: 30 capsule; Refill: 0  Follow up 3 months.   2. Pre-diabetes Very well controlled current diet and exercise.   3. Abdominal pain, unspecified abdominal location Chronic for last year, negative pelvic ultrasound last year.   - CT ABDOMEN PELVIS W CONTRAST; Future  4. Left flank pain  - CT ABDOMEN PELVIS W CONTRAST; Future      The entirety of the information documented in the History of Present Illness, Review of Systems and Physical Exam were personally obtained by me. Portions of this information were initially documented by the CMA and reviewed by me for thoroughness and accuracy.     , MD  [facility] [phone] (phone) [phone] (fax)  [facility]

## 2021-07-19 ENCOUNTER — Telehealth: Payer: Self-pay | Admitting: Family Medicine

## 2021-07-19 NOTE — Telephone Encounter (Signed)
Copied from [place] [id number]. Topic: General - Other >> Jul 19, 2021  3:55 PM Leward Quan A wrote: Reason for CRM: Melissa at [facility] called in to inform Dr Caryn Section that authorization is needed for patients appointment with outpatient imaging for CT Abdomen Pelvic on 07/22/21. Please call Melissa with questions and concerns   Ph# [phone] ext# [phone]

## 2021-07-22 ENCOUNTER — Ambulatory Visit: Admission: RE | Admit: 2021-07-22 | Payer: No Typology Code available for payment source | Source: Ambulatory Visit

## 2021-07-22 ENCOUNTER — Ambulatory Visit
Admission: RE | Admit: 2021-07-22 | Discharge: 2021-07-22 | Disposition: A | Discharge status: Home / Self Care | Payer: No Typology Code available for payment source | Source: Ambulatory Visit | Attending: Family Medicine | Admitting: Family Medicine

## 2021-07-22 DIAGNOSIS — R109 Unspecified abdominal pain: Secondary | ICD-10-CM | POA: Diagnosis present

## 2021-07-22 LAB — POCT I-STAT CREATININE: Creatinine, Ser: 0.9 mg/dL (ref 0.44–1.00)

## 2021-07-22 MED ORDER — IOHEXOL 300 MG/ML  SOLN
100.0000 mL | Freq: Once | INTRAMUSCULAR | Status: AC | PRN
  Administered 2021-07-22: 100 mL via INTRAVENOUS

## 2021-08-05 ENCOUNTER — Encounter: Payer: Self-pay | Admitting: Family Medicine

## 2021-08-05 DIAGNOSIS — K760 Fatty (change of) liver, not elsewhere classified: Secondary | ICD-10-CM | POA: Insufficient documentation

## 2021-08-17 ENCOUNTER — Other Ambulatory Visit: Payer: Self-pay

## 2021-08-17 ENCOUNTER — Other Ambulatory Visit: Payer: Self-pay | Admitting: Family Medicine

## 2021-08-17 DIAGNOSIS — F988 Other specified behavioral and emotional disorders with onset usually occurring in childhood and adolescence: Secondary | ICD-10-CM

## 2021-08-17 MED ORDER — AMPHETAMINE-DEXTROAMPHET ER 20 MG PO CP24
20.0000 mg | ORAL_CAPSULE | Freq: Every day | ORAL | 0 refills | Status: DC
  Filled 2021-08-17: qty 30, 30d supply, fill #0

## 2021-08-18 ENCOUNTER — Other Ambulatory Visit: Payer: Self-pay

## 2021-09-20 ENCOUNTER — Other Ambulatory Visit: Payer: Self-pay | Admitting: Family Medicine

## 2021-09-20 ENCOUNTER — Other Ambulatory Visit: Payer: Self-pay

## 2021-09-20 DIAGNOSIS — F988 Other specified behavioral and emotional disorders with onset usually occurring in childhood and adolescence: Secondary | ICD-10-CM

## 2021-09-20 NOTE — Telephone Encounter (Signed)
LOV 07/11/21 NOV 10/12/21 LR 08/17/21 #30 with 0 refill

## 2021-09-22 ENCOUNTER — Other Ambulatory Visit: Payer: Self-pay

## 2021-09-22 MED FILL — Amphetamine-Dextroamphetamine Cap ER 24HR 20 MG: ORAL | 30 days supply | Qty: 30 | Fill #0 | Status: AC

## 2021-09-27 ENCOUNTER — Other Ambulatory Visit: Payer: Self-pay

## 2021-09-28 ENCOUNTER — Other Ambulatory Visit: Payer: Self-pay

## 2021-10-11 NOTE — Progress Notes (Deleted)
      Established patient visit   Patient: Krystal Woodward   DOB: 1956-03-15   66 y.o. Female  MRN: [id number] Visit Date: 10/12/2021  Today's healthcare provider: [name], MD   No chief complaint on file.  Subjective    HPI  Follow up for ADD  The patient was last seen for this 3 months ago. Changes made at last visit include Change to amphetamine-dextroamphetamine (ADDERALL XR) 20 MG 24 hr capsule.  She reports {excellent/good/fair/poor:19665} compliance with treatment. She feels that condition is {improved/worse/unchanged:3041574}. She {is/is not:21021397} having side effects. ***  -----------------------------------------------------------------------------------------   Medications: Outpatient Medications Prior to Visit  Medication Sig   acetaminophen (TYLENOL) 500 MG tablet Take 1,000 mg by mouth every 8 (eight) hours as needed (pain). (Patient not taking: Reported on 07/11/2021)   amphetamine-dextroamphetamine (ADDERALL XR) 20 MG 24 hr capsule Take 1 capsule (20 mg total) by mouth daily.   aspirin 81 MG tablet Take 81 mg by mouth daily.   Cholecalciferol 125 MCG (5000 UT) capsule Take 10,000 Units by mouth daily.   cyclobenzaprine (FLEXERIL) 5 MG tablet Take 1 tablet by mouth three times daily as needed for muscle spasm (Patient not taking: Reported on 07/11/2021)   diclofenac Sodium (VOLTAREN) 1 % GEL Apply 2 g topically 4 (four) times daily.   ibuprofen (ADVIL) 200 MG tablet Take 600-800 mg by mouth every 8 (eight) hours as needed (pain). (Patient not taking: Reported on 07/11/2021)   influenza vac split quadrivalent PF (FLUARIX QUADRIVALENT) 0.5 ML injection Inject into the muscle.   lubiprostone (AMITIZA) 24 MCG capsule Take 1 capsule (24 mcg total) by mouth 2 (two) times daily with a meal. (Patient not taking: Reported on 07/11/2021)   Multiple Vitamins-Minerals (CENTRUM SILVER ULTRA WOMENS PO) Take 1 capsule by mouth daily.   Omega-3 Fatty Acids (FISH OIL PO) Take  1,500 Units by mouth daily.   TURMERIC CURCUMIN PO Take 1 tablet by mouth daily.   No facility-administered medications prior to visit.    Review of Systems  {Labs  Heme  Chem  Endocrine  Serology  Results Review (optional):23779}   Objective    There were no vitals taken for this visit. {Show previous vital signs (optional):23777}  Physical Exam  ***  No results found for any visits on 10/12/21.  Assessment & Plan     ***  No follow-ups on file.      {provider attestation***:1}   [name], MD  [facility] [phone] (phone) [phone] (fax)  [facility]

## 2021-10-12 ENCOUNTER — Ambulatory Visit: Payer: No Typology Code available for payment source | Admitting: Family Medicine

## 2021-11-04 ENCOUNTER — Other Ambulatory Visit: Payer: Self-pay

## 2021-11-04 ENCOUNTER — Other Ambulatory Visit: Payer: Self-pay | Admitting: Family Medicine

## 2021-11-04 DIAGNOSIS — F988 Other specified behavioral and emotional disorders with onset usually occurring in childhood and adolescence: Secondary | ICD-10-CM

## 2021-11-04 MED ORDER — AMPHETAMINE-DEXTROAMPHET ER 20 MG PO CP24
ORAL_CAPSULE | ORAL | 0 refills | Status: DC
  Filled 2021-11-04: qty 30, 30d supply, fill #0

## 2021-11-04 NOTE — Telephone Encounter (Signed)
Requested medication (s) are due for refill today - yes  Requested medication (s) are on the active medication list -yes  Future visit scheduled -yes  Last refill: 09/22/21 #30  Notes to clinic: non delegated Rx  Requested Prescriptions  Pending Prescriptions Disp Refills   amphetamine-dextroamphetamine (ADDERALL XR) 20 MG 24 hr capsule 30 capsule 0    Sig: Take 1 capsule (20 mg total) by mouth daily.     Not Delegated - Psychiatry:  Stimulants/ADHD Failed - 11/04/2021 10:41 AM      Failed - This refill cannot be delegated      Failed - Urine Drug Screen completed in last 360 days      Passed - Last BP in normal range    BP Readings from Last 1 Encounters:  07/11/21 124/80         Passed - Last Heart Rate in normal range    Pulse Readings from Last 1 Encounters:  07/11/21 64         Passed - Valid encounter within last 6 months    Recent Outpatient Visits           3 months ago Attention deficit disorder (ADD) without hyperactivity   [facility] Malva Limes, MD   1 year ago Left flank pain   [facility] Malva Limes, MD   1 year ago Left foot pain   [facility] Maple Hudson., MD   1 year ago Annual physical exam   [facility] Malva Limes, MD   1 year ago Attention deficit disorder (ADD) without hyperactivity   [facility] Malva Limes, MD       Future Appointments             In 2 weeks Fisher, Demetrios Isaacs, MD [facility]               Requested Prescriptions  Pending Prescriptions Disp Refills   amphetamine-dextroamphetamine (ADDERALL XR) 20 MG 24 hr capsule 30 capsule 0    Sig: Take 1 capsule (20 mg total) by mouth daily.     Not Delegated - Psychiatry:  Stimulants/ADHD Failed - 11/04/2021 10:41 AM      Failed - This refill cannot be delegated      Failed - Urine Drug Screen completed in last 360 days      Passed - Last BP in  normal range    BP Readings from Last 1 Encounters:  07/11/21 124/80         Passed - Last Heart Rate in normal range    Pulse Readings from Last 1 Encounters:  07/11/21 64         Passed - Valid encounter within last 6 months    Recent Outpatient Visits           3 months ago Attention deficit disorder (ADD) without hyperactivity   [facility] Malva Limes, MD   1 year ago Left flank pain   [facility] Malva Limes, MD   1 year ago Left foot pain   [facility] Maple Hudson., MD   1 year ago Annual physical exam   [facility] Malva Limes, MD   1 year ago Attention deficit disorder (ADD) without hyperactivity   [facility] Malva Limes, MD       Future Appointments  In 2 weeks Fisher, Demetrios Isaacs, MD [facility]

## 2021-11-21 ENCOUNTER — Ambulatory Visit: Payer: No Typology Code available for payment source | Admitting: Family Medicine

## 2021-12-05 ENCOUNTER — Other Ambulatory Visit: Payer: Self-pay

## 2021-12-05 ENCOUNTER — Encounter: Payer: Self-pay | Admitting: Family Medicine

## 2021-12-05 ENCOUNTER — Ambulatory Visit (INDEPENDENT_AMBULATORY_CARE_PROVIDER_SITE_OTHER): Payer: No Typology Code available for payment source | Admitting: Family Medicine

## 2021-12-05 ENCOUNTER — Telehealth: Payer: Self-pay | Admitting: Family Medicine

## 2021-12-05 DIAGNOSIS — F988 Other specified behavioral and emotional disorders with onset usually occurring in childhood and adolescence: Secondary | ICD-10-CM

## 2021-12-05 MED ORDER — AMPHETAMINE-DEXTROAMPHET ER 20 MG PO CP24: ORAL_CAPSULE | ORAL | 0 refills | Status: DC

## 2021-12-05 MED ORDER — AMPHETAMINE-DEXTROAMPHET ER 20 MG PO CP24
ORAL_CAPSULE | ORAL | 0 refills | Status: DC
  Filled 2021-12-05 – 2021-12-22 (×2): qty 30, 30d supply, fill #0

## 2021-12-05 NOTE — Progress Notes (Signed)
I,Roshena L Chambers,acting as a scribe for [name], MD.,have documented all relevant documentation on the behalf of [name], MD,as directed by  [name], MD while in the presence of [name], MD.   Established patient visit   Patient: Krystal Woodward   DOB: [birth year]   66 y.o. Female  MRN: [id number] Visit Date: 12/05/2021  Today's healthcare provider: [name], MD   Chief Complaint  Patient presents with   ADHD   Subjective    HPI  Follow up for ADHD:  The patient was last seen for this 3 months ago. Changes made at last visit include changing to amphetamine-dextroamphetamine (ADDERALL XR) 20 MG 24 hr capsule; Take 1 capsule (20 mg total) by mouth daily.  She reports good compliance with treatment. She feels that condition is Improved. She is not having side effects.   -----------------------------------------------------------------------------------------   Medications: Outpatient Medications Prior to Visit  Medication Sig   acetaminophen (TYLENOL) 500 MG tablet Take 1,000 mg by mouth every 8 (eight) hours as needed (pain).   amphetamine-dextroamphetamine (ADDERALL XR) 20 MG 24 hr capsule Take 1 capsule (20 mg total) by mouth daily.   aspirin 81 MG tablet Take 81 mg by mouth daily.   Cholecalciferol 125 MCG (5000 UT) capsule Take 10,000 Units by mouth daily.   cyclobenzaprine (FLEXERIL) 5 MG tablet Take 1 tablet by mouth three times daily as needed for muscle spasm   diclofenac Sodium (VOLTAREN) 1 % GEL Apply 2 g topically 4 (four) times daily.   ibuprofen (ADVIL) 200 MG tablet Take 600-800 mg by mouth every 8 (eight) hours as needed (pain).   influenza vac split quadrivalent PF (FLUARIX QUADRIVALENT) 0.5 ML injection Inject into the muscle.   lubiprostone (AMITIZA) 24 MCG capsule Take 1 capsule (24 mcg total) by mouth 2 (two) times daily with a meal.   Multiple Vitamins-Minerals (CENTRUM SILVER ULTRA WOMENS PO) Take 1 capsule by mouth  daily.   Omega-3 Fatty Acids (FISH OIL PO) Take 1,500 Units by mouth daily.   TURMERIC CURCUMIN PO Take 1 tablet by mouth daily.   No facility-administered medications prior to visit.    Review of Systems  Constitutional:  Negative for appetite change, chills, fatigue and fever.  Respiratory:  Negative for chest tightness and shortness of breath.   Cardiovascular:  Negative for chest pain and palpitations.  Gastrointestinal:  Negative for abdominal pain, nausea and vomiting.  Neurological:  Negative for dizziness and weakness.       Objective    BP 118/64 (BP Location: Right Arm, Patient Position: Sitting, Cuff Size: Large)   Pulse 60   Temp 98.6 F (37 C) (Oral)   Resp 16   Wt 161 lb (73 kg)   SpO2 100% Comment: room air  BMI 29.45 kg/m    Physical Exam   General appearance: Well developed, well nourished female, cooperative and in no acute distress Head: Normocephalic, without obvious abnormality, atraumatic Respiratory: Respirations even and unlabored, normal respiratory rate Extremities: All extremities are intact.  Skin: Skin color, texture, turgor normal. No rashes seen  Psych: Appropriate mood and affect. Neurologic: Mental status: Alert, oriented to person, place, and time, thought content appropriate.   Assessment & Plan     1. Attention deficit disorder (ADD) without hyperactivity Doing well with change to QD amphetamine-dextroamphetamine (ADDERALL XR) 20 MG 24 hr capsule. Refill to Take 1 capsule (20 mg total) by mouth daily.  Dispense: 30 capsule; Refill: 0   Follow up CPE  in 2-3 months.         The entirety of the information documented in the History of Present Illness, Review of Systems and Physical Exam were personally obtained by me. Portions of this information were initially documented by the CMA and reviewed by me for thoroughness and accuracy.     [name], MD  [facility] [phone] (phone) [phone] (fax)  [facility]

## 2021-12-05 NOTE — Patient Instructions (Signed)
.   Please review the attached list of medications and notify my office if there are any errors.   . Please bring all of your medications to every appointment so we can make sure that our medication list is the same as yours.   

## 2021-12-05 NOTE — Telephone Encounter (Signed)
Pt called in for assistance. Pt had a visit today with provider and had Rx sent to the wrong pharmacy, pt is requesting to have her Rx for amphetamine-dextroamphetamine (ADDERALL XR) 20 MG 24 hr capsule sent in to  [facility] Phone:  [phone]  ax:  [phone]    Pt says that she need to have Rx sent there instead of  [facility] [address] [place], [place] - [address] Phone:  [phone]  Fax:  [phone]      Please assist pt further.

## 2021-12-22 ENCOUNTER — Other Ambulatory Visit: Payer: Self-pay

## 2021-12-22 ENCOUNTER — Other Ambulatory Visit: Payer: Self-pay | Admitting: Family Medicine

## 2021-12-22 DIAGNOSIS — K5909 Other constipation: Secondary | ICD-10-CM

## 2021-12-22 NOTE — Telephone Encounter (Signed)
lubiprostone (AMITIZA) 24 MCG capsule Pt states requested this med from pharmacy and pharmacy denied. Pt states did not ever pu last script, wants new script or fu with pt [phone]. Pt states Pharmacy was not going to resubmit? FU if appropriate [phone] [facility] [address] [place] - [address]  [address] [place] [address]  Phone: [phone] Fax: [phone]  Hours: Not open 24 hours

## 2021-12-28 ENCOUNTER — Encounter: Payer: Self-pay | Admitting: Family Medicine

## 2021-12-28 DIAGNOSIS — K5909 Other constipation: Secondary | ICD-10-CM

## 2021-12-29 MED ORDER — LUBIPROSTONE 24 MCG PO CAPS: 24.0000 ug | ORAL_CAPSULE | Freq: Two times a day (BID) | ORAL | 11 refills | Status: DC

## 2021-12-29 NOTE — Telephone Encounter (Signed)
Last OV  12/05/21  Next OV 02/17/22   Last RF was 08/04/20 qty 60  with  2 refills

## 2022-01-12 ENCOUNTER — Ambulatory Visit: Payer: Self-pay | Admitting: *Deleted

## 2022-01-12 NOTE — Progress Notes (Unsigned)
Established patient visit   Patient: Krystal Woodward   DOB: 02/05/1956   66 y.o. Female  MRN: [id number] Visit Date: 01/13/2022  Today's healthcare provider: Lelon Huh, MD   No chief complaint on file.  Subjective    HPI  Patient is a 66 year old female who presents for evaluation of a rash.  She first noticed it 3 days ago.  She describes it as a fine, red rash.  She states it does not itch.  It is on all extremities and trunk but not on face.   She denies any change in laundry soaps, toiletries, medicine and diet. She has tried using Benadryl. It is much better today, although not completely resolved.   She is also here follow up ADD, feels that Adderall is working well and having no adverse effects.   Medications: Outpatient Medications Prior to Visit  Medication Sig   acetaminophen (TYLENOL) 500 MG tablet Take 1,000 mg by mouth every 8 (eight) hours as needed (pain).   amphetamine-dextroamphetamine (ADDERALL XR) 20 MG 24 hr capsule Take 1 capsule (20 mg total) by mouth daily.   aspirin 81 MG tablet Take 81 mg by mouth daily.   Cholecalciferol 125 MCG (5000 UT) capsule Take 10,000 Units by mouth daily.   cyclobenzaprine (FLEXERIL) 5 MG tablet Take 1 tablet by mouth three times daily as needed for muscle spasm   diclofenac Sodium (VOLTAREN) 1 % GEL Apply 2 g topically 4 (four) times daily.   ibuprofen (ADVIL) 200 MG tablet Take 600-800 mg by mouth every 8 (eight) hours as needed (pain).   Multiple Vitamins-Minerals (CENTRUM SILVER ULTRA WOMENS PO) Take 1 capsule by mouth daily.   Omega-3 Fatty Acids (FISH OIL PO) Take 1,500 Units by mouth daily.   TURMERIC CURCUMIN PO Take 1 tablet by mouth daily.   lubiprostone (AMITIZA) 24 MCG capsule Take 1 capsule (24 mcg total) by mouth 2 (two) times daily with a meal. (Patient not taking: Reported on 01/13/2022)   No facility-administered medications prior to visit.    Review of Systems  Constitutional:  Negative for appetite  change, chills, fatigue and fever.  Respiratory:  Negative for chest tightness and shortness of breath.   Cardiovascular:  Negative for chest pain and palpitations.  Gastrointestinal:  Negative for abdominal pain, nausea and vomiting.  Neurological:  Negative for dizziness and weakness.       Objective    BP 116/69 (BP Location: Left Arm, Patient Position: Sitting, Cuff Size: Normal)   Pulse 72   Temp 98 F (36.7 C) (Oral)   Resp 18   Wt 159 lb (72.1 kg)   BMI 29.08 kg/m    Physical Exam   Awake, alert, oriented x 3. In no apparent distress  Diffuse faint papules from head to toe as per HPI. No excoriations, no erythema, no pigmented lesions.    Assessment & Plan     1. Papular urticaria Now mostly resolved. Can continue OTC antihistamines prn until completely resolved. No clear trigger, but suspect ingestions of something she is allergic to.   2. Attention deficit disorder (ADD) without hyperactivity Doing well on current dose of Adderall which she is tolerating well with no adverse effects.  - amphetamine-dextroamphetamine (ADDERALL XR) 20 MG 24 hr capsule; Take 1 capsule (20 mg total) by mouth daily.  Dispense: 30 capsule; Refill: 0  3. Chronic constipation  She states she did not tolerate Amitiza 24mg  which made her nauseated.  Will try  linaclotide (  LINZESS) 72 MCG capsule; Take 1 capsule (72 mcg total) by mouth daily before breakfast.  Dispense: 30 capsule; Refill: 3   4. Need for influenza vaccination  - Flu Vaccine QUAD 6+ mos PF IM (Fluarix Quad PF)     The entirety of the information documented in the History of Present Illness, Review of Systems and Physical Exam were personally obtained by me. Portions of this information were initially documented by the CMA and reviewed by me for thoroughness and accuracy.     [name], MD  [facility] [phone] (phone) [phone] (fax)  [facility]

## 2022-01-12 NOTE — Telephone Encounter (Signed)
Summary: rash   Pt states she has broken out w/ a rash "all over her body"   Please assist further      Reason for Disposition  Mild widespread rash  (Exception: Heat rash lasting 3 days or less.)  Answer Assessment - Initial Assessment Questions 1. APPEARANCE of RASH: "Describe the rash." (e.g., spots, blisters, raised areas, skin peeling, scaly)     Red bumps-smaller than mosquito bite 2. SIZE: "How big are the spots?" (e.g., tip of pen, eraser, coin; inches, centimeters)     mulitple 3. LOCATION: "Where is the rash located?"     Thighs, ankles, arms 4. COLOR: "What color is the rash?" (Note: It is difficult to assess rash color in people with darker-colored skin. When this situation occurs, simply ask the caller to describe what they see.)     red 5. ONSET: "When did the rash begin?"     Noticed yesterday am 6. FEVER: "Do you have a fever?" If Yes, ask: "What is your temperature, how was it measured, and when did it start?"     no 7. ITCHING: "Does the rash itch?" If Yes, ask: "How bad is the itch?" (Scale 1-10; or mild, moderate, severe)     no 8. CAUSE: "What do you think is causing the rash?"     unsure 9. MEDICINE FACTORS: "Have you started any new medicines within the last 2 weeks?" (e.g., antibiotics)       amitiza 10. OTHER SYMPTOMS: "Do you have any other symptoms?" (e.g., dizziness, headache, sore throat, joint pain)       no 11. PREGNANCY: "Is there any chance you are pregnant?" "When was your last menstrual period?"  Protocols used: Rash or Redness - [facility]

## 2022-01-12 NOTE — Telephone Encounter (Signed)
  Chief Complaint: rash Symptoms: rash- legs, arms Frequency: started yesterday Pertinent Negatives: Patient denies itching, pain, fever Disposition: [] ED /[] Urgent Care (no appt availability in office) / [x] Appointment(In office/virtual)/ []  [place] [facility]/ [] Home Care/ [] Refused Recommended Disposition /[]  Mobile Bus/ []  Follow-up with PCP Additional Notes:

## 2022-01-13 ENCOUNTER — Other Ambulatory Visit: Payer: Self-pay

## 2022-01-13 ENCOUNTER — Ambulatory Visit (INDEPENDENT_AMBULATORY_CARE_PROVIDER_SITE_OTHER): Payer: No Typology Code available for payment source | Admitting: Family Medicine

## 2022-01-13 VITALS — BP 116/69 | HR 72 | Temp 98.0°F | Resp 18 | Wt 159.0 lb

## 2022-01-13 DIAGNOSIS — Z23 Encounter for immunization: Secondary | ICD-10-CM

## 2022-01-13 DIAGNOSIS — K5909 Other constipation: Secondary | ICD-10-CM

## 2022-01-13 DIAGNOSIS — F988 Other specified behavioral and emotional disorders with onset usually occurring in childhood and adolescence: Secondary | ICD-10-CM | POA: Diagnosis not present

## 2022-01-13 DIAGNOSIS — L282 Other prurigo: Secondary | ICD-10-CM

## 2022-01-13 MED ORDER — LINACLOTIDE 72 MCG PO CAPS
72.0000 ug | ORAL_CAPSULE | Freq: Every day | ORAL | 3 refills | Status: DC
  Filled 2022-01-13 – 2022-01-16 (×2): qty 30, 30d supply, fill #0

## 2022-01-13 MED ORDER — AMPHETAMINE-DEXTROAMPHET ER 20 MG PO CP24
ORAL_CAPSULE | ORAL | 0 refills | Status: DC
  Filled 2022-01-13: qty 30, fill #0
  Filled 2022-01-16 – 2022-01-23 (×2): qty 30, 30d supply, fill #0

## 2022-01-16 ENCOUNTER — Other Ambulatory Visit: Payer: Self-pay

## 2022-01-16 DIAGNOSIS — K5909 Other constipation: Secondary | ICD-10-CM | POA: Insufficient documentation

## 2022-01-23 ENCOUNTER — Other Ambulatory Visit: Payer: Self-pay

## 2022-01-31 ENCOUNTER — Ambulatory Visit (INDEPENDENT_AMBULATORY_CARE_PROVIDER_SITE_OTHER): Payer: No Typology Code available for payment source | Admitting: Family Medicine

## 2022-01-31 ENCOUNTER — Encounter: Payer: Self-pay | Admitting: Family Medicine

## 2022-01-31 ENCOUNTER — Other Ambulatory Visit: Payer: Self-pay

## 2022-01-31 ENCOUNTER — Telehealth: Payer: Self-pay | Admitting: Family Medicine

## 2022-01-31 VITALS — BP 131/74 | HR 74 | Resp 16 | Ht 62.0 in | Wt 165.0 lb

## 2022-01-31 DIAGNOSIS — S39012A Strain of muscle, fascia and tendon of lower back, initial encounter: Secondary | ICD-10-CM

## 2022-01-31 MED ORDER — TIZANIDINE HCL 2 MG PO TABS
2.0000 mg | ORAL_TABLET | Freq: Three times a day (TID) | ORAL | 1 refills | Status: DC
  Filled 2022-01-31: qty 30, 5d supply, fill #0

## 2022-01-31 MED ORDER — NABUMETONE 500 MG PO TABS
1000.0000 mg | ORAL_TABLET | Freq: Every day | ORAL | 1 refills | Status: DC
  Filled 2022-01-31: qty 30, 15d supply, fill #0

## 2022-01-31 NOTE — Progress Notes (Signed)
I,Tiffany J Bragg,acting as a scribe for Lelon Huh, MD.,have documented all relevant documentation on the behalf of Lelon Huh, MD,as directed by  Lelon Huh, MD while in the presence of Lelon Huh, MD.  Established patient visit   Patient: Krystal Woodward   DOB: 1956-04-05   66 y.o. Female  MRN: [id number] Visit Date: 01/31/2022  Today's healthcare provider: Lelon Huh, MD   Chief Complaint  Patient presents with   Back Pain    Patient complains of L lower back pain for a month.    Subjective    HPI pain started suddenly when was twisted to reach laundry basket while getting out of recliner. Taken up to 4 Advil along with extra strength tylenol. No heat or ice, but tried Bengay and peppermint oil. She did have mild multilevel degenerative disc disease on LS spine films last year and mild rightward curvature of thoracic spine on CT earlier this year.   Medications: Outpatient Medications Prior to Visit  Medication Sig   acetaminophen (TYLENOL) 500 MG tablet Take 1,000 mg by mouth every 8 (eight) hours as needed (pain).   amphetamine-dextroamphetamine (ADDERALL XR) 20 MG 24 hr capsule Take 1 capsule (20 mg total) by mouth daily.   aspirin 81 MG tablet Take 81 mg by mouth daily.   Cholecalciferol 125 MCG (5000 UT) capsule Take 10,000 Units by mouth daily.   ibuprofen (ADVIL) 200 MG tablet Take 600-800 mg by mouth every 8 (eight) hours as needed (pain).   linaclotide (LINZESS) 72 MCG capsule Take 1 capsule (72 mcg total) by mouth daily before breakfast.   Multiple Vitamins-Minerals (CENTRUM SILVER ULTRA WOMENS PO) Take 1 capsule by mouth daily.   Omega-3 Fatty Acids (FISH OIL PO) Take 1,500 Units by mouth daily.   TURMERIC CURCUMIN PO Take 1 tablet by mouth daily.   No facility-administered medications prior to visit.    Review of Systems  Constitutional:  Negative for appetite change, chills, fatigue and fever.  Respiratory:  Negative for chest tightness and  shortness of breath.   Cardiovascular:  Negative for chest pain and palpitations.  Gastrointestinal:  Negative for abdominal pain, nausea and vomiting.  Neurological:  Negative for dizziness and weakness.       Objective    BP 131/74 (BP Location: Right Arm, Patient Position: Sitting, Cuff Size: Normal)   Pulse 74   Resp 16   Ht 5\' 2"  (1.575 m)   Wt 165 lb (74.8 kg)   SpO2 98%   BMI 30.18 kg/m    Physical Exam   Tender over left para lumbar muscles. No gross deformities.    Assessment & Plan     1. Back strain, initial encounter  - nabumetone (RELAFEN) 500 MG tablet; Take 2 tablets (1,000 mg total) by mouth daily. For back pain  Dispense: 30 tablet; Refill: 1 - tiZANidine (ZANAFLEX) 2 MG tablet; Take 1-2 tablets (2-4 mg total) by mouth 3 (three) times daily.  Dispense: 30 tablet; Refill: 1   Can take OTC acetaminophen but no NSAIDs. Recommend warm compress a few times a day. Call if symptoms change or if not rapidly improving.       The entirety of the information documented in the History of Present Illness, Review of Systems and Physical Exam were personally obtained by me. Portions of this information were initially documented by the CMA and reviewed by me for thoroughness and accuracy.     Lelon Huh, MD  [facility] [phone] (phone)  [phone] (fax)  [place]

## 2022-01-31 NOTE — Telephone Encounter (Signed)
Pt stated she is experiencing severe back pain 1-10 about 8. Pt declined to speak with NT, who was scheduled to see Dr. Caryn Section today.  FYI to the office.

## 2022-02-16 NOTE — Progress Notes (Signed)
I,Roshena L Chambers,acting as a scribe for [name], MD.,have documented all relevant documentation on the behalf of [name], MD,as directed by  [name], MD while in the presence of [name], MD.    Complete physical exam   Patient: Krystal Woodward   DOB: 01-28-1956   66 y.o. Female  MRN: [id number] Visit Date: 02/17/2022  Today's healthcare provider: [name], MD   Chief Complaint  Patient presents with   Annual Exam   Elbow Pain   Subjective    Krystal Woodward is a 66 y.o. female who presents today for a complete physical exam.  She reports consuming a general diet. Gym/ health club routine includes cardio. She generally feels fairly well. She reports sleeping fairly well. She does have additional problems to discuss today.  HPI  Elbow pain: Patient complains of pain in her right elbow for the past 3 weeks. She denies any recent injuries. No known specific injury, but she does go to the gym and work with weights most days.   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   Breast mass 01/24/2011   Chest pain    Elevated liver enzymes    GERD (gastroesophageal reflux disease)    h/o   Hyperprolactinemia (HCC) 4166;0630   Normal MRI   Vitamin D deficiency    Past Surgical History:  Procedure Laterality Date   BREAST BIOPSY Right 06/17/15   Inflammed Fibrocyst   BREAST CYST EXCISION  02/27/2011   Procedure: CYST EXCISION BREAST;  Surgeon: Currie Paris, MD;  Location: [facility];  Service: General;  Laterality: Left;  Needle localization removal left breast mass   BREAST EXCISIONAL BIOPSY Left    BUNIONECTOMY     both feet   COLONOSCOPY WITH PROPOFOL N/A 09/10/2019   Procedure: COLONOSCOPY WITH PROPOFOL;  Surgeon: Toledo, Boykin Nearing, MD;  Location: [facility];  Service: Gastroenterology;  Laterality: N/A;   INCISION AND DRAINAGE ABSCESS Right 07/28/2015   Procedure: INCISION AND DRAINAGE  RIGHT BREAST  ABSCESS;  Surgeon: Manus Rudd, MD;  Location: [facility];  Service: General;  Laterality: Right;   SHOULDER ARTHROSCOPY WITH ROTATOR CUFF REPAIR AND OPEN BICEPS TENODESIS  10/10/2018   Procedure: SHOULDER ARTHROSCOPY WITH OPEN ROTATOR CUFF REPAIR AND MINI OPEN BICEPS TENODESIS;  Surgeon: Christena Flake, MD;  Location: [facility];  Service: Orthopedics;;   SHOULDER ARTHROSCOPY WITH SUBACROMIAL DECOMPRESSION  10/10/2018   Procedure: SHOULDER ARTHROSCOPY WITH SUBACROMIAL DECOMPRESSION;  Surgeon: Christena Flake, MD;  Location: [facility];  Service: Orthopedics;;   TUBAL LIGATION  1996   Social History   Socioeconomic History   Marital status: Married    Spouse name: Not on file   Number of children: 4   Years of education: Not on file   Highest education level: Not on file  Occupational History   Occupation: CNA     Comment: [employer]  Tobacco Use   Smoking status: Never   Smokeless tobacco: Never  Vaping Use   Vaping Use: Never used  Substance and Sexual Activity   Alcohol use: No   Drug use: No   Sexual activity: Not on file  Other Topics Concern   Not on file  Social History Narrative   Not on file   Social Determinants of Health   Financial Resource Strain: Not on file  Food Insecurity: Not on file  Transportation Needs: Not on file  Physical Activity: Not on file  Stress: Not on file  Social Connections: Not on file  Intimate Partner Violence: Not on file   Family Status  Relation Name Status   Mother  Deceased at age 54   Father  Deceased at age 56's   Sister  Deceased   PGM  (Not Specified)   Neg Hx  (Not Specified)   Family History  Problem Relation Age of Onset   Cancer Mother        lymph nodes   Transient ischemic attack Mother    Diabetes Father    Cancer Sister 65       96 Rectal cancer, spread to Lymph node, lung cancer, non-smoker   Asthma Paternal Grandmother    Diabetes Paternal Grandmother    Breast cancer Neg Hx     Allergies  Allergen Reactions   Amitiza [Lubiprostone] Nausea Only    Patient Care Team: Malva Limes, MD as PCP - General (Family Medicine) Poggi, Excell Seltzer, MD as Consulting Physician (Surgery)   Medications: Outpatient Medications Prior to Visit  Medication Sig   acetaminophen (TYLENOL) 500 MG tablet Take 1,000 mg by mouth every 8 (eight) hours as needed (pain).   amphetamine-dextroamphetamine (ADDERALL XR) 20 MG 24 hr capsule Take 1 capsule (20 mg total) by mouth daily.   aspirin 81 MG tablet Take 81 mg by mouth daily.   Cholecalciferol 125 MCG (5000 UT) capsule Take 10,000 Units by mouth daily.   ibuprofen (ADVIL) 200 MG tablet Take 600-800 mg by mouth every 8 (eight) hours as needed (pain).   linaclotide (LINZESS) 72 MCG capsule Take 1 capsule (72 mcg total) by mouth daily before breakfast.   Multiple Vitamins-Minerals (CENTRUM SILVER ULTRA WOMENS PO) Take 1 capsule by mouth daily.   Omega-3 Fatty Acids (FISH OIL PO) Take 1,500 Units by mouth daily.   TURMERIC CURCUMIN PO Take 1 tablet by mouth daily.   nabumetone (RELAFEN) 500 MG tablet Take 2 tablets (1,000 mg total) by mouth daily. For back pain (Patient not taking: Reported on 02/17/2022)   tiZANidine (ZANAFLEX) 2 MG tablet Take 1-2 tablets (2-4 mg total) by mouth 3 (three) times daily. (Patient not taking: Reported on 02/17/2022)   No facility-administered medications prior to visit.    Review of Systems  Constitutional:  Negative for chills, fatigue and fever.  HENT:  Negative for congestion, ear pain, rhinorrhea, sneezing and sore throat.   Eyes: Negative.  Negative for pain and redness.  Respiratory:  Negative for cough, shortness of breath and wheezing.   Cardiovascular:  Negative for chest pain and leg swelling.  Gastrointestinal:  Negative for abdominal pain, blood in stool, constipation, diarrhea and nausea.  Endocrine: Negative for polydipsia and polyphagia.  Genitourinary: Negative.  Negative for dysuria,  flank pain, hematuria, pelvic pain, vaginal bleeding and vaginal discharge.  Musculoskeletal:  Negative for arthralgias, back pain, gait problem and joint swelling.  Skin:  Negative for rash.  Neurological: Negative.  Negative for dizziness, tremors, seizures, weakness, light-headedness, numbness and headaches.  Hematological:  Negative for adenopathy.  Psychiatric/Behavioral: Negative.  Negative for behavioral problems, confusion and dysphoric mood. The patient is not nervous/anxious and is not hyperactive.       Objective    BP 119/65 (BP Location: Right Arm, Patient Position: Sitting, Cuff Size: Large)   Pulse 68   Temp 98.3 F (36.8 C) (Oral)   Resp 14   Ht 5\' 2"  (1.575 m)   Wt 164 lb 11.2 oz (74.7 kg)  SpO2 100% Comment: room air  BMI 30.12 kg/m     Physical Exam   General Appearance:    Obese female. Alert, cooperative, in no acute distress, appears stated age   Head:    Normocephalic, without obvious abnormality, atraumatic  Eyes:    PERRL, conjunctiva/corneas clear, EOM's intact, fundi    benign, both eyes  Ears:    Normal TM's and external ear canals, both ears  Nose:   Nares normal, septum midline, mucosa normal, no drainage    or sinus tenderness  Throat:   Lips, mucosa, and tongue normal; teeth and gums normal  Neck:   Supple, symmetrical, trachea midline, no adenopathy;    thyroid:  no enlargement/tenderness/nodules; no carotid   bruit or JVD  Back:     Symmetric, no curvature, ROM normal, no CVA tenderness  Lungs:     Clear to auscultation bilaterally, respirations unlabored  Chest Wall:    No tenderness or deformity   Heart:    Normal heart rate. Normal rhythm.  No murmurs, rubs, or gallops.   Breast Exam:    normal appearance, no masses or tenderness, Inspection negative, No nipple retraction or dimpling, No nipple discharge or bleeding, No axillary or supraclavicular adenopathy, deferred  Abdomen:     Soft, non-tender, bowel sounds active all four  quadrants,    no masses, no organomegaly  Pelvic:    deferred  MS:   Tender proximal right posterior elbow, pain reproduced by extension against resistance. No pain on pronation or supination against resistance.   Pulses:   2+ and symmetric all extremities  Skin:   Skin color, texture, turgor normal, no rashes or lesions  Lymph nodes:   Cervical, supraclavicular, and axillary nodes normal  Neurologic:   CNII-XII intact, normal strength, sensation and reflexes    throughout     Last depression screening scores    02/17/2022    1:45 PM 01/31/2022   10:49 AM 01/13/2022   11:21 AM  PHQ 2/9 Scores  PHQ - 2 Score 0 0 0  PHQ- 9 Score 0 0 0   Last fall risk screening    02/17/2022    1:45 PM  [place] in the past year? 0  Number falls in past yr: 0  Injury with Fall? 0  Risk for fall due to : No Fall Risks  Follow up Falls evaluation completed   Last Audit-C alcohol use screening    02/17/2022    1:45 PM  Alcohol Use Disorder Test (AUDIT)  1. How often do you have a drink containing alcohol? 0  2. How many drinks containing alcohol do you have on a typical day when you are drinking? 0  3. How often do you have six or more drinks on one occasion? 0  AUDIT-C Score 0   A score of 3 or more in women, and 4 or more in men indicates increased risk for alcohol abuse, EXCEPT if all of the points are from question 1   No results found for any visits on 02/17/22.  Assessment & Plan    Routine Health Maintenance and Physical Exam  Exercise Activities and Dietary recommendations  Goals   None     Immunization History  Administered Date(s) Administered   Influenza,inj,Quad PF,6+ Mos 01/16/2017, 01/18/2018, 12/31/2018, 01/10/2021, 01/13/2022   PFIZER(Purple Top)SARS-COV-2 Vaccination 07/28/2019, 08/19/2019   Tdap 01/28/2010    Health Maintenance  Topic Date Due   Zoster Vaccines- Shingrix (1 of 2) Never  done   COVID-19 Vaccine (3 - Pfizer series) 10/14/2019    TETANUS/TDAP  01/29/2020   MAMMOGRAM  05/21/2023   PAP SMEAR-Modifier  08/05/2023   COLONOSCOPY (Pts 45-39yrs Insurance coverage will need to be confirmed)  09/09/2024   INFLUENZA VACCINE  Completed   Hepatitis C Screening  Completed   HIV Screening  Completed   HPV VACCINES  Aged Out    Discussed health benefits of physical activity, and encouraged her to engage in regular exercise appropriate for her age and condition.  1. Annual physical exam  - CBC - Comprehensive metabolic panel - Lipid panel   2. Triceps tendonitis Relative rest, apply ice prn. Proximal elastic elbow ban.   3. Pre-diabetes  - CBC - Lipid panel - Hemoglobin A1c  4. Hepatic steatosis  - Comprehensive metabolic panel      The entirety of the information documented in the History of Present Illness, Review of Systems and Physical Exam were personally obtained by me. Portions of this information were initially documented by the CMA and reviewed by me for thoroughness and accuracy.     [name], MD  [facility] [phone] (phone) [phone] (fax)  [facility]

## 2022-02-17 ENCOUNTER — Encounter: Payer: Self-pay | Admitting: Family Medicine

## 2022-02-17 ENCOUNTER — Ambulatory Visit (INDEPENDENT_AMBULATORY_CARE_PROVIDER_SITE_OTHER): Payer: No Typology Code available for payment source | Admitting: Family Medicine

## 2022-02-17 VITALS — BP 119/65 | HR 68 | Temp 98.3°F | Resp 14 | Ht 62.0 in | Wt 164.7 lb

## 2022-02-17 DIAGNOSIS — K760 Fatty (change of) liver, not elsewhere classified: Secondary | ICD-10-CM

## 2022-02-17 DIAGNOSIS — Z0000 Encounter for general adult medical examination without abnormal findings: Secondary | ICD-10-CM | POA: Diagnosis not present

## 2022-02-17 DIAGNOSIS — M778 Other enthesopathies, not elsewhere classified: Secondary | ICD-10-CM | POA: Diagnosis not present

## 2022-02-17 DIAGNOSIS — R7303 Prediabetes: Secondary | ICD-10-CM

## 2022-02-18 LAB — COMPREHENSIVE METABOLIC PANEL
ALT: 16 IU/L (ref 0–32)
AST: 27 IU/L (ref 0–40)
Albumin/Globulin Ratio: 1.6 (ref 1.2–2.2)
Albumin: 4.4 g/dL (ref 3.9–4.9)
Alkaline Phosphatase: 160 IU/L — ABNORMAL HIGH (ref 44–121)
BUN/Creatinine Ratio: 16 (ref 12–28)
BUN: 15 mg/dL (ref 8–27)
Bilirubin Total: 0.3 mg/dL (ref 0.0–1.2)
CO2: 25 mmol/L (ref 20–29)
Calcium: 10.2 mg/dL (ref 8.7–10.3)
Chloride: 103 mmol/L (ref 96–106)
Creatinine, Ser: 0.94 mg/dL (ref 0.57–1.00)
Globulin, Total: 2.8 g/dL (ref 1.5–4.5)
Glucose: 85 mg/dL (ref 70–99)
Potassium: 4.1 mmol/L (ref 3.5–5.2)
Sodium: 143 mmol/L (ref 134–144)
Total Protein: 7.2 g/dL (ref 6.0–8.5)
eGFR: 68 mL/min/{1.73_m2} (ref >59)

## 2022-02-18 LAB — LIPID PANEL
Chol/HDL Ratio: 2.7 ratio (ref 0.0–4.4)
Cholesterol, Total: 157 mg/dL (ref 100–199)
HDL: 58 mg/dL (ref >39)
LDL Chol Calc (NIH): 85 mg/dL (ref 0–99)
Triglycerides: 70 mg/dL (ref 0–149)
VLDL Cholesterol Cal: 14 mg/dL (ref 5–40)

## 2022-02-18 LAB — CBC
Hematocrit: 36.4 % (ref 34.0–46.6)
Hemoglobin: 12.4 g/dL (ref 11.1–15.9)
MCH: 31.6 pg (ref 26.6–33.0)
MCHC: 34.1 g/dL (ref 31.5–35.7)
MCV: 93 fL (ref 79–97)
Platelets: 261 10*3/uL (ref 150–450)
RBC: 3.92 x10E6/uL (ref 3.77–5.28)
RDW: 12.6 % (ref 11.7–15.4)
WBC: 8.3 10*3/uL (ref 3.4–10.8)

## 2022-02-18 LAB — HEMOGLOBIN A1C
Est. average glucose Bld gHb Est-mCnc: 126 mg/dL
Hgb A1c MFr Bld: 6 % — ABNORMAL HIGH (ref 4.8–5.6)

## 2022-03-07 ENCOUNTER — Encounter: Payer: Self-pay | Admitting: Physician Assistant

## 2022-03-07 ENCOUNTER — Ambulatory Visit (INDEPENDENT_AMBULATORY_CARE_PROVIDER_SITE_OTHER): Payer: No Typology Code available for payment source | Admitting: Physician Assistant

## 2022-03-07 ENCOUNTER — Encounter: Payer: No Typology Code available for payment source | Admitting: Family Medicine

## 2022-03-07 ENCOUNTER — Other Ambulatory Visit: Payer: Self-pay

## 2022-03-07 VITALS — BP 122/64 | HR 81 | Temp 98.5°F | Ht 62.0 in | Wt 163.0 lb

## 2022-03-07 DIAGNOSIS — J0110 Acute frontal sinusitis, unspecified: Secondary | ICD-10-CM

## 2022-03-07 MED ORDER — AMOXICILLIN 875 MG PO TABS
875.0000 mg | ORAL_TABLET | Freq: Two times a day (BID) | ORAL | 0 refills | Status: AC
  Filled 2022-03-07: qty 14, 7d supply, fill #0

## 2022-03-07 NOTE — Progress Notes (Signed)
I,Sha'taria Tyson,acting as a Education administrator for [employer], PA-C.,have documented all relevant documentation on the behalf of Krystal Kirschner, PA-C,as directed by  Krystal Kirschner, PA-C while in the presence of Krystal Kirschner, PA-C.   Established patient visit   Patient: Krystal Woodward   DOB: [birth year]   66 y.o. Female  MRN: [id number] Visit Date: 03/07/2022  Today's healthcare provider: [name], PA-C   Cc. Cough, congestion x 2 weeks  Subjective     Pt reports nasal congestion with green/yellow mucous with an associated cough and headache x 2 weeks. She felt her symptoms improved but have worsened again over the past few days.  Reports trying honey for symptoms. Reports her son is also ill, that he took a covid test at one point and was negative.   Medications: Outpatient Medications Prior to Visit  Medication Sig   acetaminophen (TYLENOL) 500 MG tablet Take 1,000 mg by mouth every 8 (eight) hours as needed (pain).   amphetamine-dextroamphetamine (ADDERALL XR) 20 MG 24 hr capsule Take 1 capsule (20 mg total) by mouth daily.   aspirin 81 MG tablet Take 81 mg by mouth daily.   Cholecalciferol 125 MCG (5000 UT) capsule Take 10,000 Units by mouth daily.   ibuprofen (ADVIL) 200 MG tablet Take 600-800 mg by mouth every 8 (eight) hours as needed (pain).   linaclotide (LINZESS) 72 MCG capsule Take 1 capsule (72 mcg total) by mouth daily before breakfast.   Multiple Vitamins-Minerals (CENTRUM SILVER ULTRA WOMENS PO) Take 1 capsule by mouth daily.   Omega-3 Fatty Acids (FISH OIL PO) Take 1,500 Units by mouth daily.   TURMERIC CURCUMIN PO Take 1 tablet by mouth daily.   [DISCONTINUED] nabumetone (RELAFEN) 500 MG tablet Take 2 tablets (1,000 mg total) by mouth daily. For back pain (Patient not taking: Reported on 02/17/2022)   [DISCONTINUED] tiZANidine (ZANAFLEX) 2 MG tablet Take 1-2 tablets (2-4 mg total) by mouth 3 (three) times daily. (Patient not taking: Reported on 02/17/2022)    No facility-administered medications prior to visit.    Review of Systems  HENT:  Positive for rhinorrhea.   Respiratory:  Positive for cough.      Objective    Blood pressure 122/64, pulse 81, temperature 98.5 F (36.9 C), temperature source Oral, height 5\' 2"  (1.575 m), weight 163 lb (73.9 kg), SpO2 98 %.   Physical Exam Constitutional:      General: She is awake.     Appearance: She is well-developed.  HENT:     Head: Normocephalic.     Right Ear: Tympanic membrane normal.     Left Ear: Tympanic membrane normal.     Nose: Rhinorrhea present.     Mouth/Throat:     Pharynx: No oropharyngeal exudate or posterior oropharyngeal erythema.  Eyes:     Conjunctiva/sclera: Conjunctivae normal.  Cardiovascular:     Rate and Rhythm: Normal rate.  Pulmonary:     Effort: Pulmonary effort is normal.     Breath sounds: Normal breath sounds.  Skin:    General: Skin is warm.  Neurological:     Mental Status: She is alert and oriented to person, place, and time.  Psychiatric:        Attention and Perception: Attention normal.        Mood and Affect: Mood normal.        Speech: Speech normal.        Behavior: Behavior is cooperative.     No results found for any visits  on 03/07/22.  Assessment & Plan     Acute sinusitis Advised zyrtec/claritin otc.  Rx amoxicillin 875 mg bid x 7 days   Return if symptoms worsen or fail to improve.      I, Alfredia Ferguson, PA-C have reviewed all documentation for this visit. The documentation on  03/07/2022  for the exam, diagnosis, procedures, and orders are all accurate and complete.  Alfredia Ferguson, PA-C [facility] [address] [place], [place], [address] Office: [phone] Fax: [phone]   [facility]

## 2022-04-07 ENCOUNTER — Telehealth: Payer: Self-pay | Admitting: Family Medicine

## 2022-04-07 ENCOUNTER — Other Ambulatory Visit: Payer: Self-pay

## 2022-04-07 DIAGNOSIS — F988 Other specified behavioral and emotional disorders with onset usually occurring in childhood and adolescence: Secondary | ICD-10-CM

## 2022-04-07 MED ORDER — AMPHETAMINE-DEXTROAMPHET ER 20 MG PO CP24
ORAL_CAPSULE | ORAL | 0 refills | Status: DC
  Filled 2022-04-07: qty 30, 30d supply, fill #0

## 2022-04-07 NOTE — Telephone Encounter (Signed)
Patient is requesting a refill on Adderall to be sent to [facility] employee pharmacy

## 2022-04-07 NOTE — Addendum Note (Signed)
Addended by: Malva Limes on: 04/07/2022 04:46 PM   Modules accepted: Orders

## 2022-04-19 ENCOUNTER — Other Ambulatory Visit: Payer: Self-pay

## 2022-05-15 ENCOUNTER — Ambulatory Visit (INDEPENDENT_AMBULATORY_CARE_PROVIDER_SITE_OTHER): Payer: 59 | Admitting: Family Medicine

## 2022-05-15 DIAGNOSIS — Z23 Encounter for immunization: Secondary | ICD-10-CM | POA: Diagnosis not present

## 2022-05-15 NOTE — Progress Notes (Signed)
Administered first shingrix vaccine left deltoid and 4TH BOOSTER COVID 19 shot on right deltoid. Pt tolerated well.

## 2022-05-22 ENCOUNTER — Other Ambulatory Visit: Payer: Self-pay | Admitting: Family Medicine

## 2022-05-22 DIAGNOSIS — F988 Other specified behavioral and emotional disorders with onset usually occurring in childhood and adolescence: Secondary | ICD-10-CM

## 2022-05-22 NOTE — Telephone Encounter (Signed)
Medication Refill - Medication: amphetamine-dextroamphetamine (ADDERALL XR) 20 MG 24 hr capsule   Has the patient contacted their pharmacy? Yes.    Patient was referred to PCP.  Preferred Pharmacy (with phone number or street name):  [place] Phone: [phone]  Fax: [phone]     Has the patient been seen for an appointment in the last year OR does the patient have an upcoming appointment? Yes.    Agent: Please be advised that RX refills may take up to 3 business days. We ask that you follow-up with your pharmacy.

## 2022-05-23 ENCOUNTER — Other Ambulatory Visit: Payer: Self-pay

## 2022-05-23 MED ORDER — AMPHETAMINE-DEXTROAMPHET ER 20 MG PO CP24
20.0000 mg | ORAL_CAPSULE | Freq: Every day | ORAL | 0 refills | Status: DC
  Filled 2022-05-23: qty 30, 30d supply, fill #0

## 2022-05-23 NOTE — Telephone Encounter (Signed)
Requested medications are due for refill today.  Provider to determine  Requested medications are on the active medications list.  yes  Last refill. 04/07/2022 #30 0 rf  Future visit scheduled.   Yes - with nurse  Notes to clinic.  Refill not delegated.    Requested Prescriptions  Pending Prescriptions Disp Refills   amphetamine-dextroamphetamine (ADDERALL XR) 20 MG 24 hr capsule 30 capsule 0    Sig: Take 1 capsule (20 mg total) by mouth daily.     Not Delegated - Psychiatry:  Stimulants/ADHD Failed - 05/22/2022 10:43 AM      Failed - This refill cannot be delegated      Failed - Urine Drug Screen completed in last 360 days      Passed - Last BP in normal range    BP Readings from Last 1 Encounters:  03/07/22 122/64         Passed - Last Heart Rate in normal range    Pulse Readings from Last 1 Encounters:  03/07/22 81         Passed - Valid encounter within last 6 months    Recent Outpatient Visits           1 week ago Need for prophylactic vaccination and inoculation against single bacterial disease   [place] Birdie Sons, MD   2 months ago Acute non-recurrent frontal sinusitis   [place] [name], PA-C   3 months ago Annual physical exam   [facility] Birdie Sons, MD   3 months ago Back strain, initial encounter   [facility] Birdie Sons, MD   4 months ago Papular urticaria   [place], Donald E, MD

## 2022-06-25 IMAGING — CR DG KNEE COMPLETE 4+V*L*
1 series · 4 of 4 positions shown · non-contrast
Comparison: None.

CLINICAL DATA: Left knee pain.

EXAM:
LEFT KNEE - COMPLETE 4+ VIEW

[Series 1: dg knee complete 4 views left · 0.14mm/px · 4 of 4 slices shown]
[im 1/4]
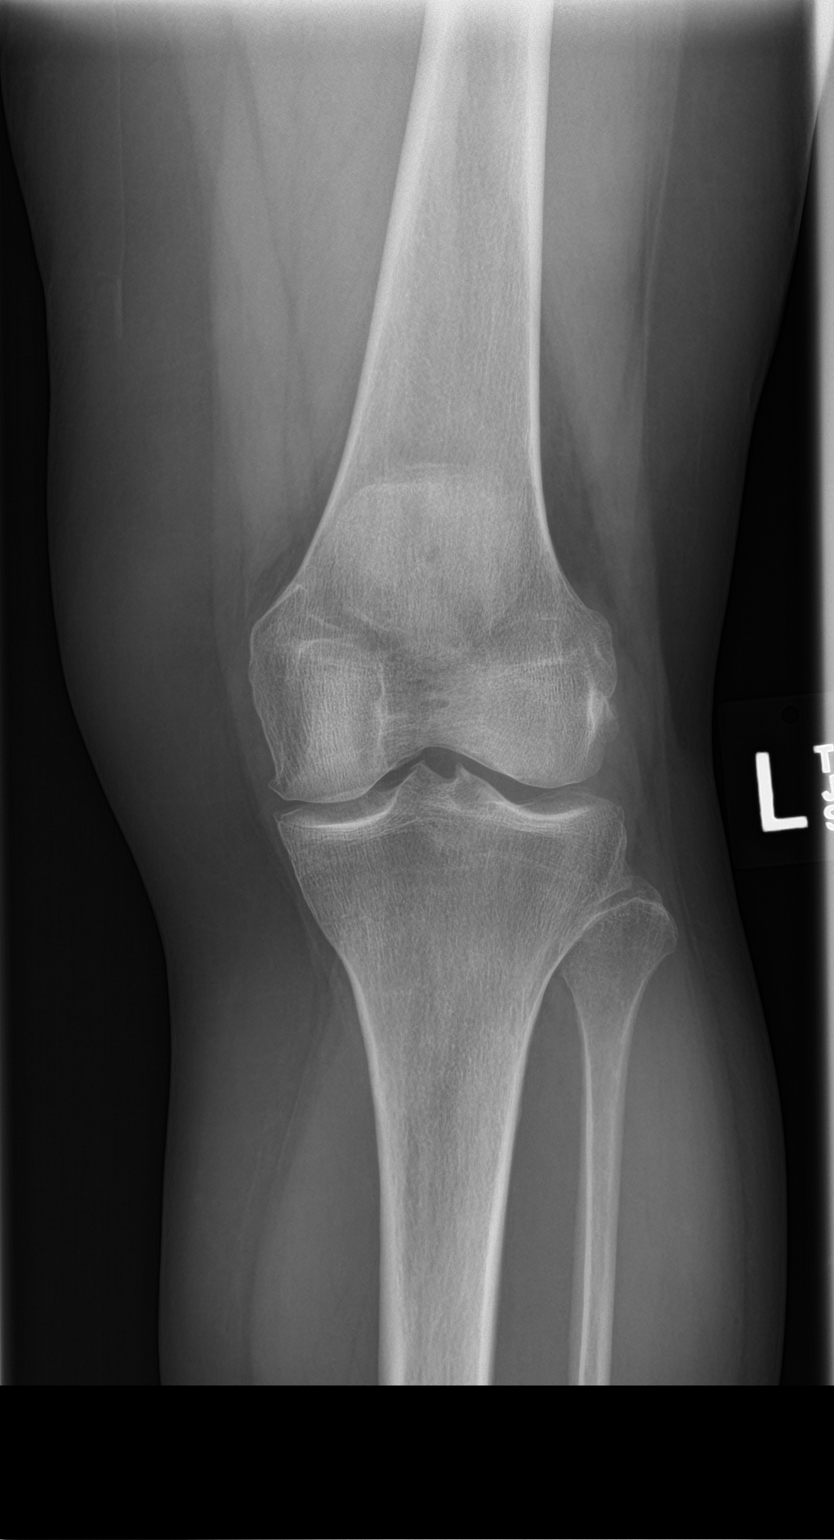
[im 2/4]
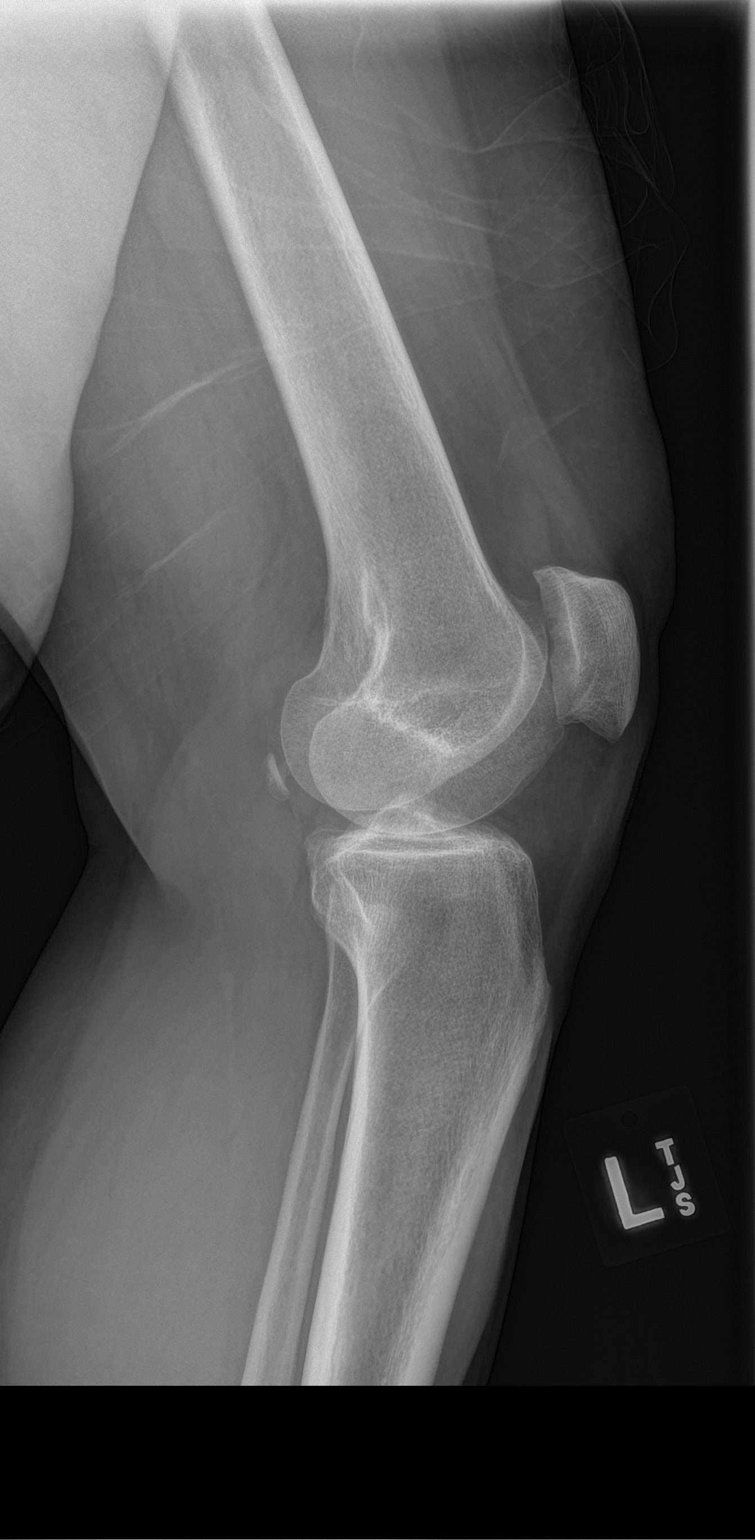
[im 3/4]
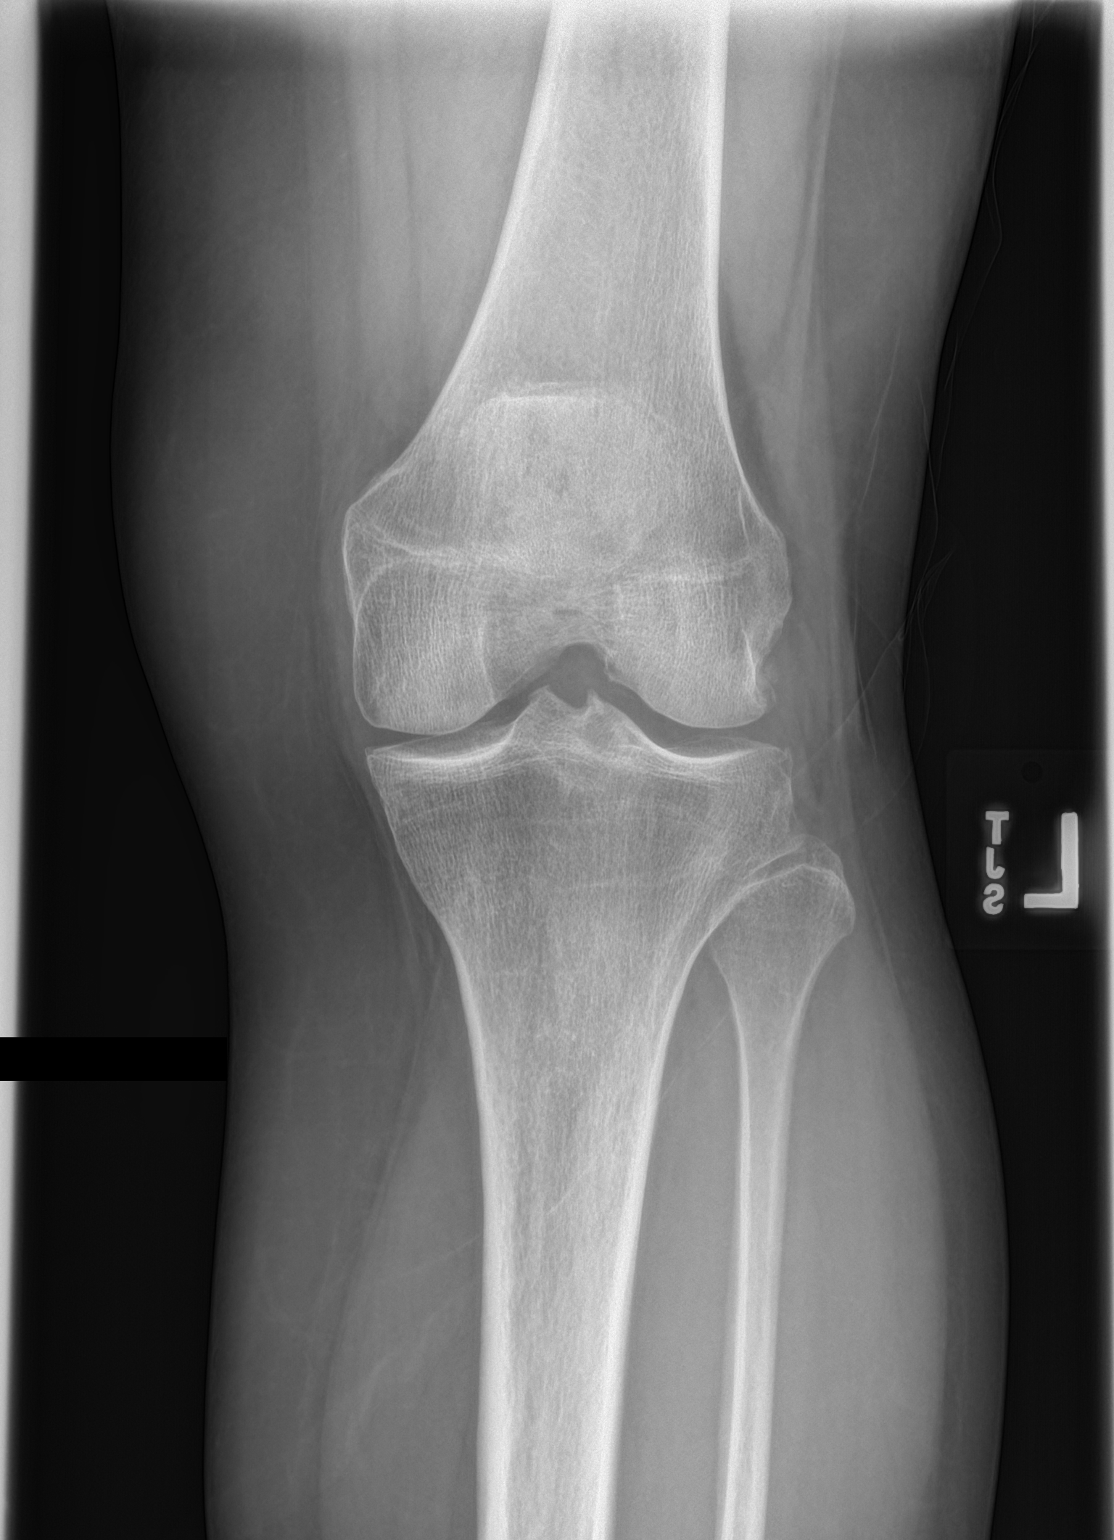
[im 4/4]
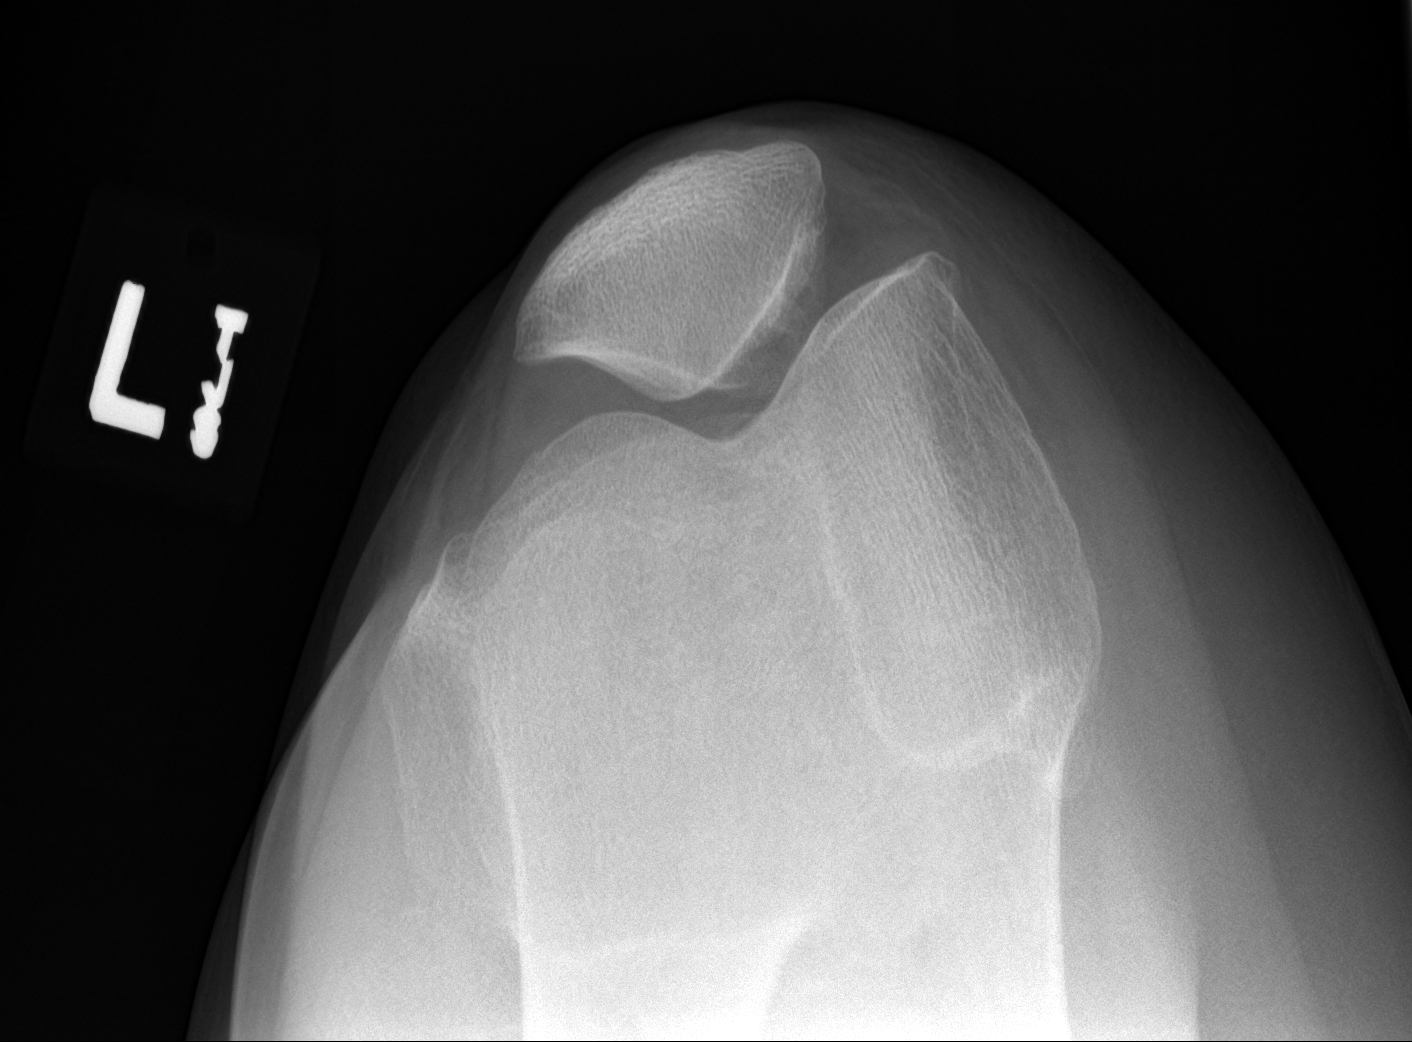

[4 of 4 positions shown; findings below may reference images not displayed]

FINDINGS: No fracture or dislocation. No joint effusion identified. Very mild
tricompartmental degenerative changes. No other abnormalities.
IMPRESSION: Mild tricompartmental degenerative changes.  No other abnormalities.

## 2022-06-25 IMAGING — CR DG LUMBAR SPINE COMPLETE 4+V
1 series · 5 of 5 positions shown · non-contrast
Comparison: None.

CLINICAL DATA: Pain.

EXAM:
LUMBAR SPINE - COMPLETE 4+ VIEW

[Series 1: dg lumbar spine complete 4 +v · 0.14mm/px · 5 of 5 slices shown]
[im 1/5]
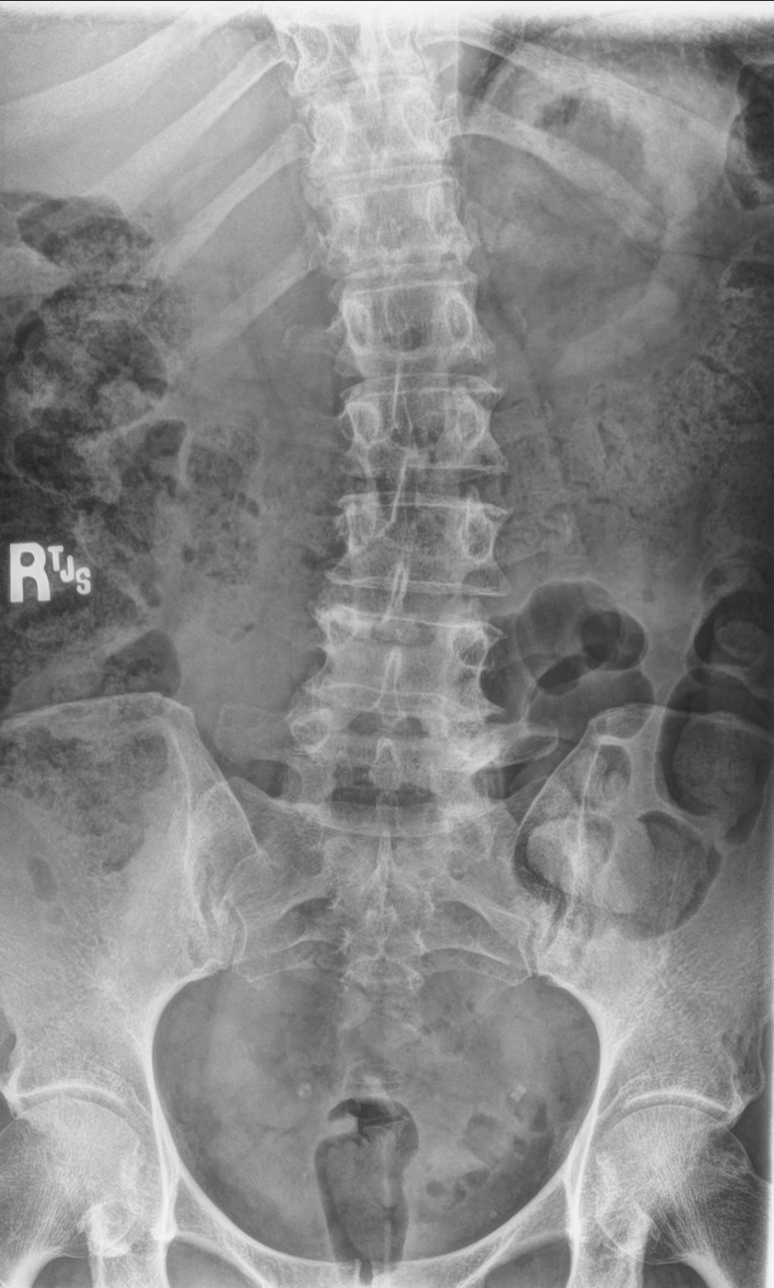
[im 2/5]
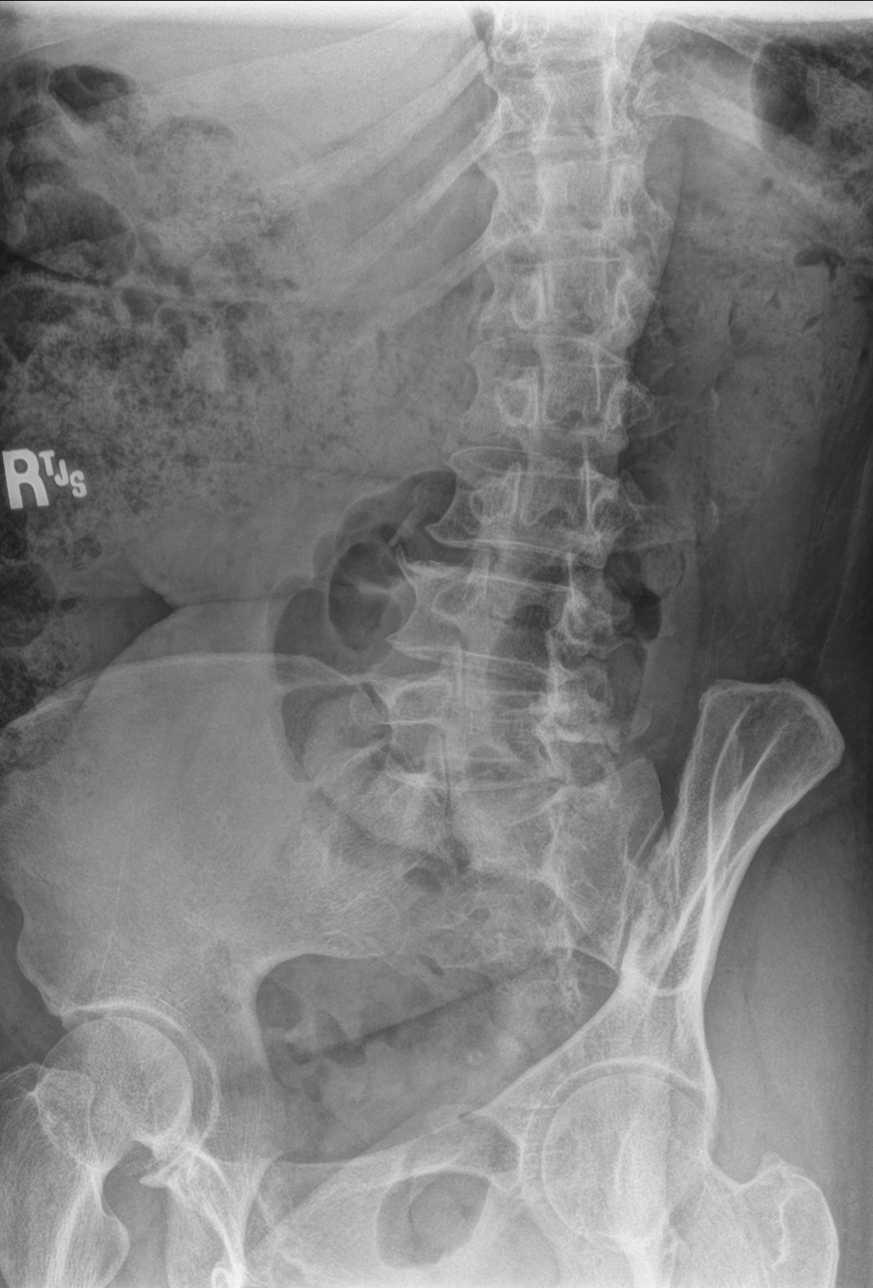
[im 3/5]
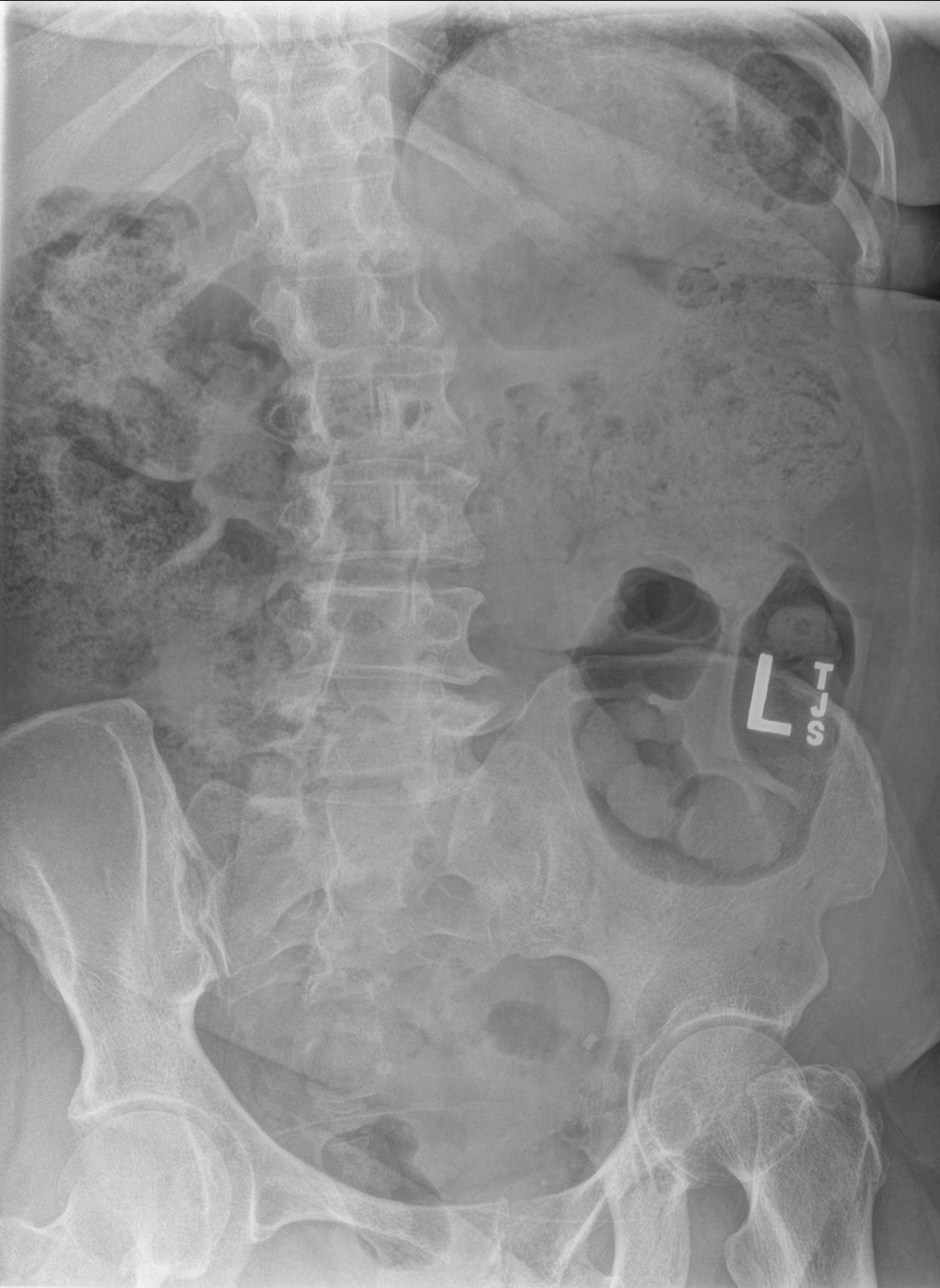
[im 4/5]
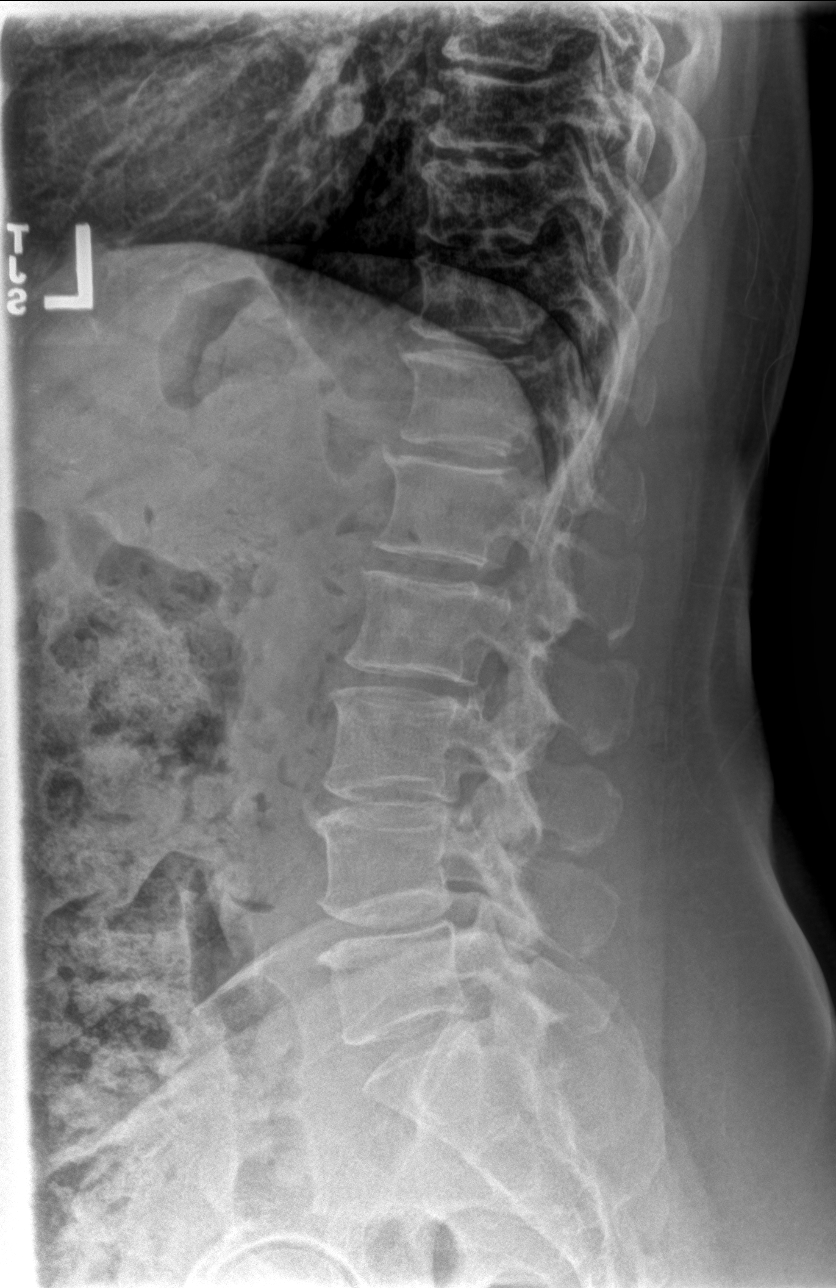
[im 5/5]
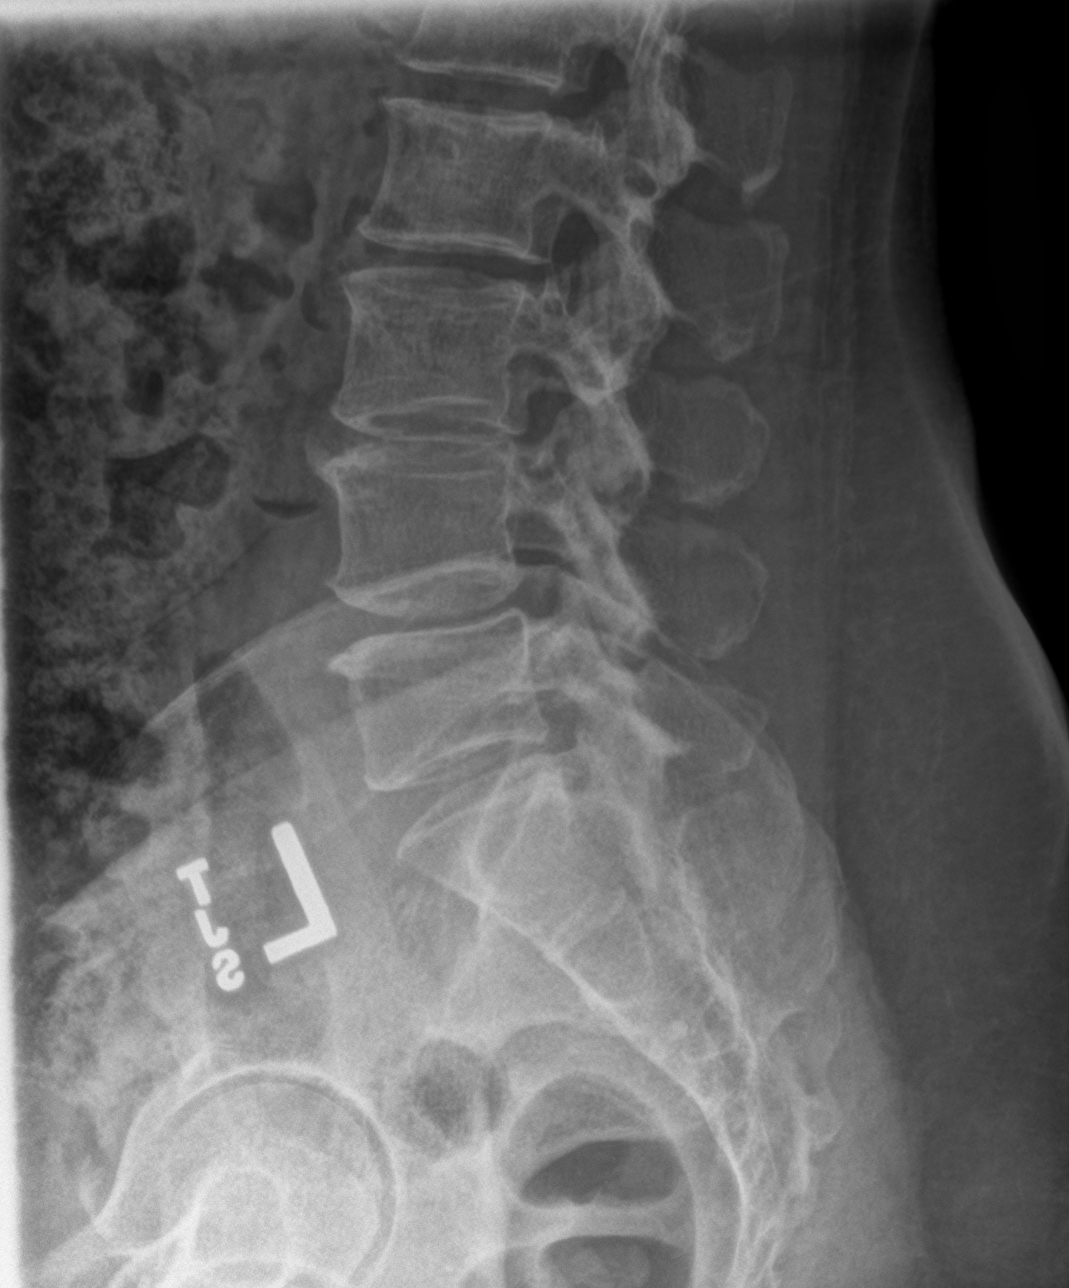

[5 of 5 positions shown; findings below may reference images not displayed]

FINDINGS: No fracture or malalignment. Multilevel degenerative disc disease,
mild. Severe fecal loading throughout the colon.
IMPRESSION: 1. Severe fecal loading throughout the colon.
2. Mild multilevel degenerative disc disease.

## 2022-06-25 IMAGING — CR DG HIP (WITH OR WITHOUT PELVIS) 2-3V*R*
1 series · 3 of 3 positions shown · non-contrast
Comparison: None.

CLINICAL DATA: Pain

EXAM:
DG HIP (WITH OR WITHOUT PELVIS) 2-3V RIGHT

[Series 1: dg hip unilat w or w/o pelvis 2-3 views  · non-contrast · 0.14mm/px · 3 of 3 slices shown]
[im 1/3]
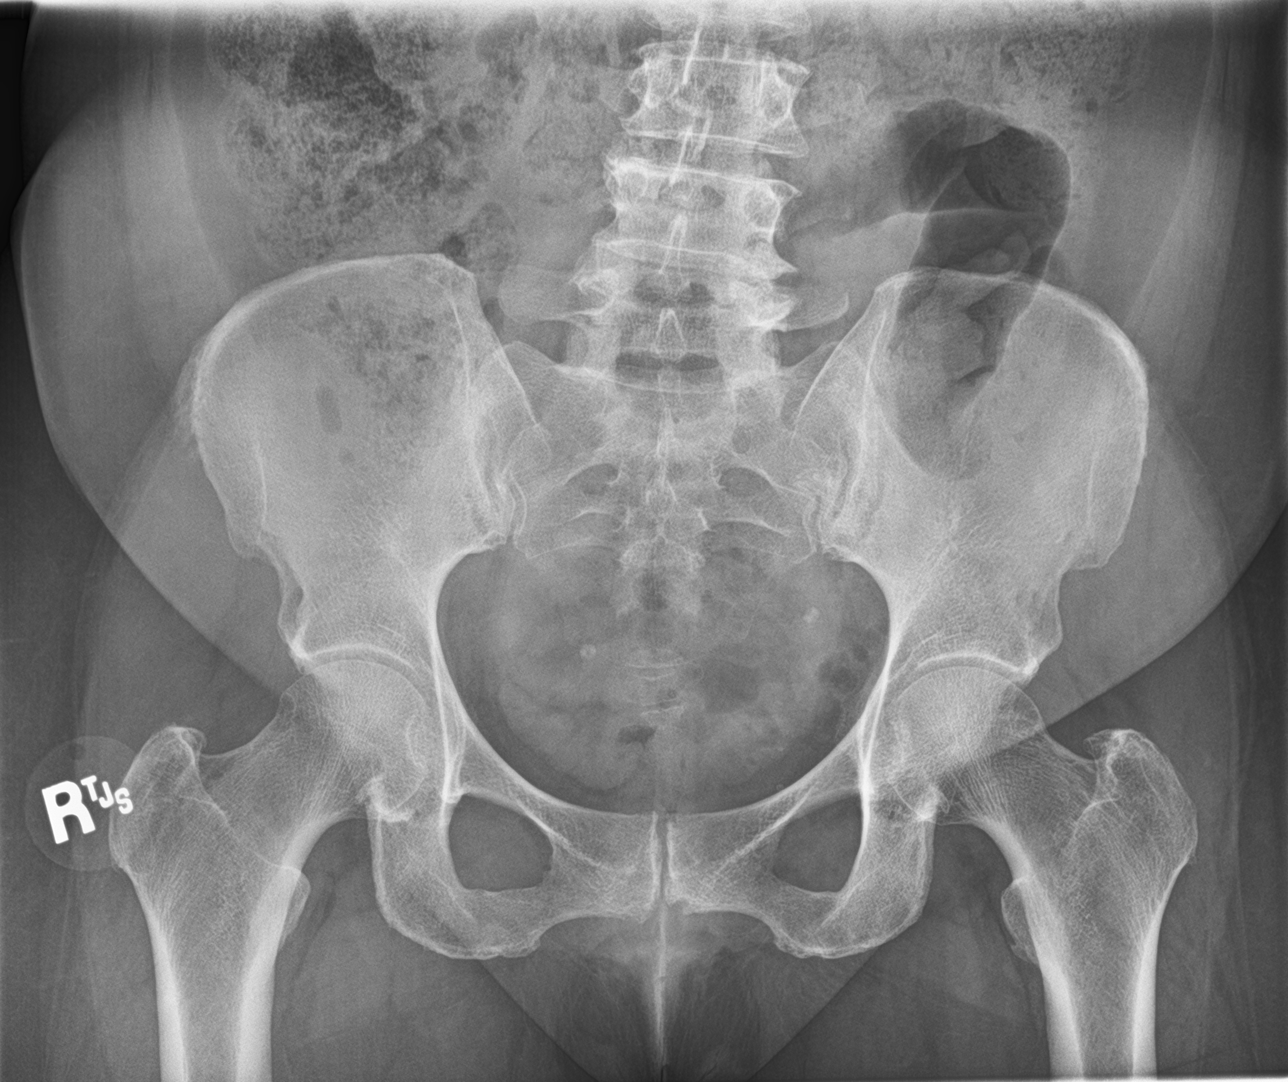
[im 2/3]
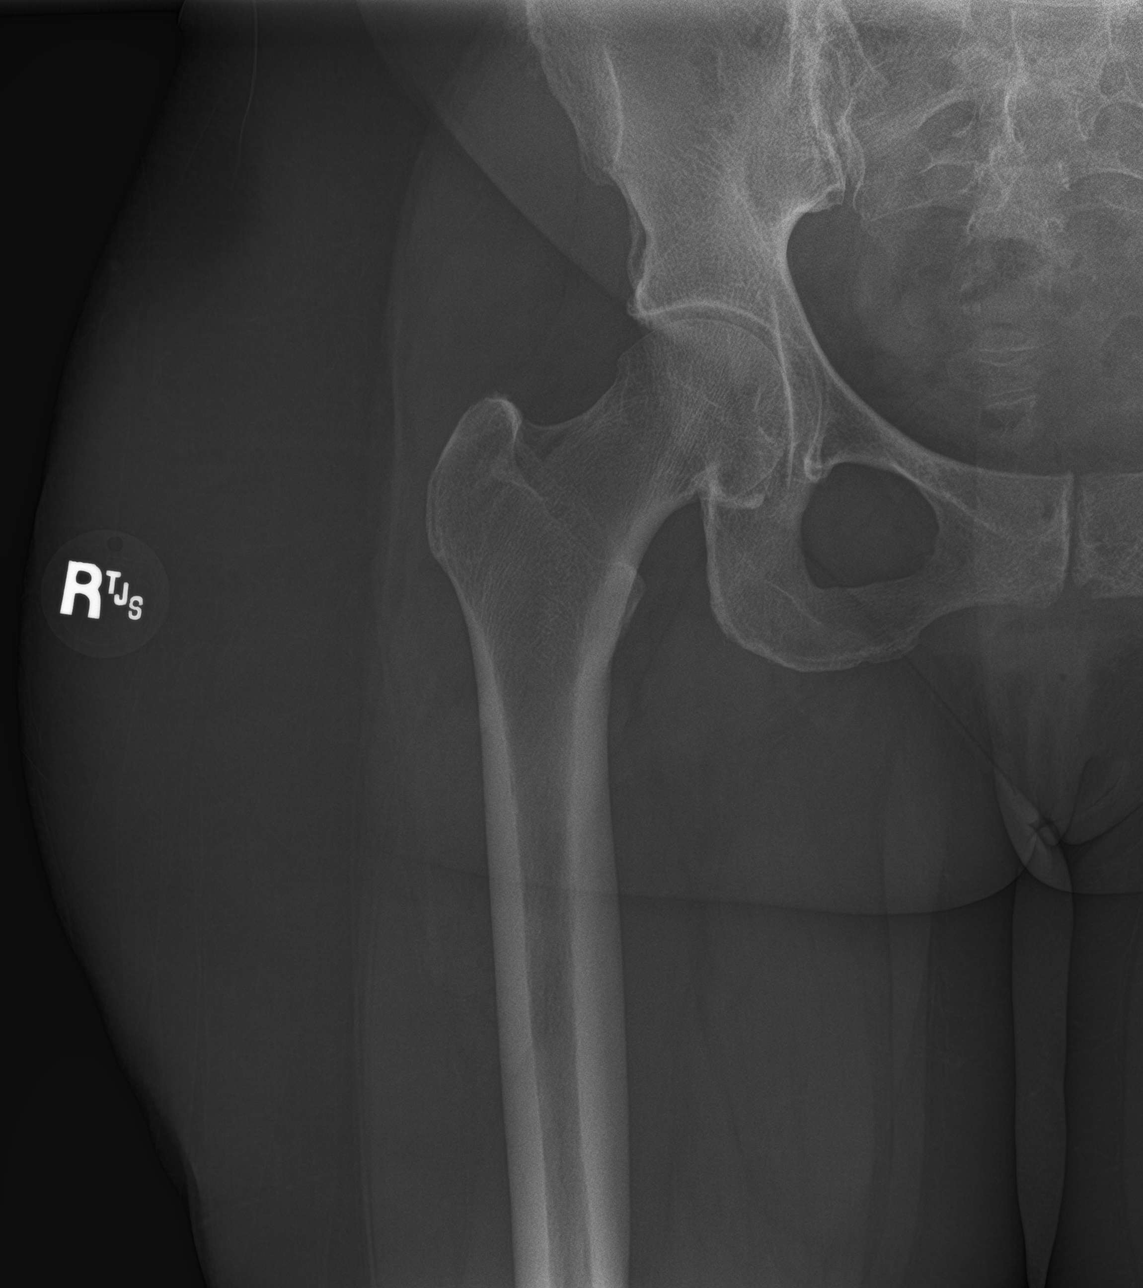
[im 3/3]
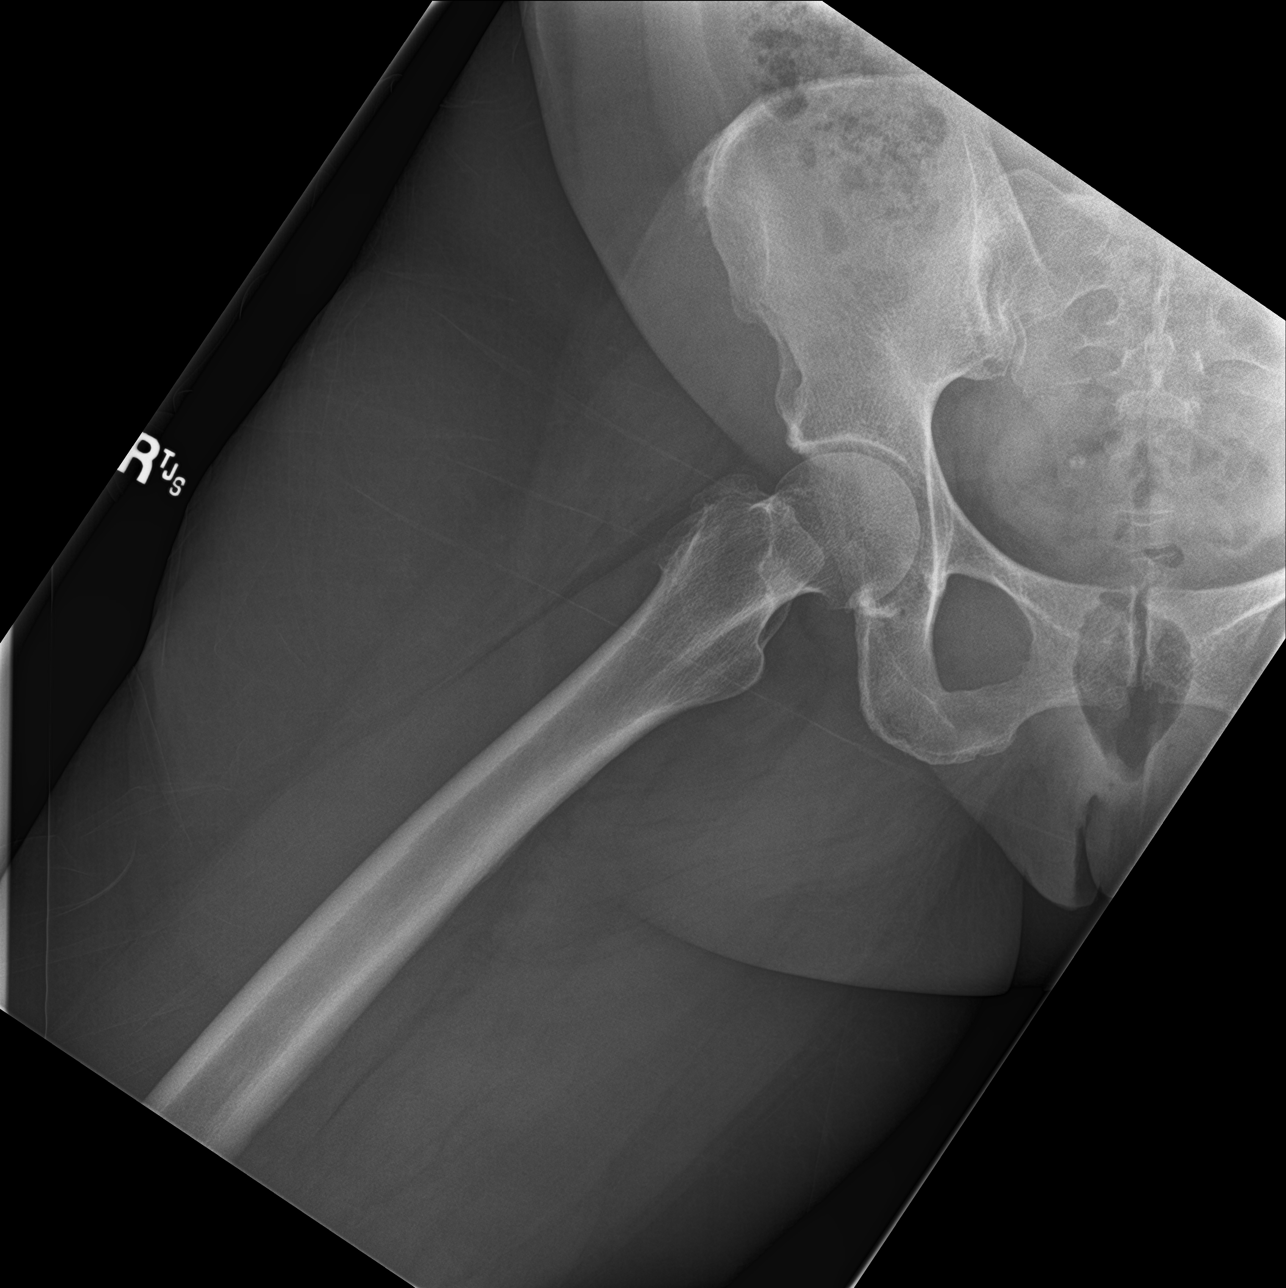

[3 of 3 positions shown; findings below may reference images not displayed]

FINDINGS: There is no evidence of hip fracture or dislocation. There is no
evidence of arthropathy or other focal bone abnormality.
IMPRESSION: Negative.

## 2022-06-28 ENCOUNTER — Ambulatory Visit: Payer: 59

## 2022-07-03 ENCOUNTER — Other Ambulatory Visit: Payer: Self-pay | Admitting: Family Medicine

## 2022-07-03 DIAGNOSIS — Z1231 Encounter for screening mammogram for malignant neoplasm of breast: Secondary | ICD-10-CM

## 2022-07-03 DIAGNOSIS — F988 Other specified behavioral and emotional disorders with onset usually occurring in childhood and adolescence: Secondary | ICD-10-CM

## 2022-07-03 NOTE — Telephone Encounter (Signed)
Medication Refill - Medication: amphetamine-dextroamphetamine (ADDERALL XR) 20 MG 24 hr capsule   Has the patient contacted their pharmacy? No. Pt states pharmacy always tell her to contact her PCP.   Preferred Pharmacy (with phone number or street name):  [place] Phone: [phone]  Fax: [phone]     Has the patient been seen for an appointment in the last year OR does the patient have an upcoming appointment? Yes.    Agent: Please be advised that RX refills may take up to 3 business days. We ask that you follow-up with your pharmacy. Marland Kitchen

## 2022-07-04 ENCOUNTER — Other Ambulatory Visit: Payer: Self-pay

## 2022-07-04 MED ORDER — AMPHETAMINE-DEXTROAMPHET ER 20 MG PO CP24
20.0000 mg | ORAL_CAPSULE | Freq: Every day | ORAL | 0 refills | Status: DC
  Filled 2022-07-04: qty 30, 30d supply, fill #0

## 2022-07-04 NOTE — Telephone Encounter (Signed)
Requested medication (s) are due for refill today - yes  Requested medication (s) are on the active medication list -yes  Future visit scheduled -no  Last refill: 05/23/22 #30  Notes to clinic: non delegated Rx  Requested Prescriptions  Pending Prescriptions Disp Refills   amphetamine-dextroamphetamine (ADDERALL XR) 20 MG 24 hr capsule 30 capsule 0    Sig: Take 1 capsule (20 mg total) by mouth daily.     Not Delegated - Psychiatry:  Stimulants/ADHD Failed - 07/03/2022  3:26 PM      Failed - This refill cannot be delegated      Failed - Urine Drug Screen completed in last 360 days      Passed - Last BP in normal range    BP Readings from Last 1 Encounters:  03/07/22 122/64         Passed - Last Heart Rate in normal range    Pulse Readings from Last 1 Encounters:  03/07/22 81         Passed - Valid encounter within last 6 months    Recent Outpatient Visits           1 month ago Need for prophylactic vaccination and inoculation against single bacterial disease   [place], Donald E, MD   3 months ago Acute non-recurrent frontal sinusitis   [place] [name], PA-C   4 months ago Annual physical exam   [facility] Birdie Sons, MD   5 months ago Back strain, initial encounter   [facility] Birdie Sons, MD   5 months ago Papular urticaria   [place], MD                 Requested Prescriptions  Pending Prescriptions Disp Refills   amphetamine-dextroamphetamine (ADDERALL XR) 20 MG 24 hr capsule 30 capsule 0    Sig: Take 1 capsule (20 mg total) by mouth daily.     Not Delegated - Psychiatry:  Stimulants/ADHD Failed - 07/03/2022  3:26 PM      Failed - This refill cannot be delegated      Failed - Urine Drug Screen completed in last 360 days      Passed - Last BP in normal range    BP  Readings from Last 1 Encounters:  03/07/22 122/64         Passed - Last Heart Rate in normal range    Pulse Readings from Last 1 Encounters:  03/07/22 81         Passed - Valid encounter within last 6 months    Recent Outpatient Visits           1 month ago Need for prophylactic vaccination and inoculation against single bacterial disease   [place], Donald E, MD   3 months ago Acute non-recurrent frontal sinusitis   [place] [name], PA-C   4 months ago Annual physical exam   [facility] Birdie Sons, MD   5 months ago Back strain, initial encounter   [facility] Birdie Sons, MD   5 months ago Papular urticaria   [place], Donald E, MD

## 2022-07-06 ENCOUNTER — Other Ambulatory Visit: Payer: Self-pay

## 2022-07-07 ENCOUNTER — Ambulatory Visit
Admission: RE | Admit: 2022-07-07 | Discharge: 2022-07-07 | Disposition: A | Discharge status: Home / Self Care | Payer: 59 | Source: Ambulatory Visit | Attending: Family Medicine | Admitting: Family Medicine

## 2022-07-07 DIAGNOSIS — Z1231 Encounter for screening mammogram for malignant neoplasm of breast: Secondary | ICD-10-CM | POA: Diagnosis not present

## 2022-07-19 ENCOUNTER — Ambulatory Visit (INDEPENDENT_AMBULATORY_CARE_PROVIDER_SITE_OTHER): Payer: 59 | Admitting: Physician Assistant

## 2022-07-19 ENCOUNTER — Ambulatory Visit: Payer: 59

## 2022-07-19 DIAGNOSIS — Z23 Encounter for immunization: Secondary | ICD-10-CM | POA: Diagnosis not present

## 2022-08-05 IMAGING — US US PELVIS COMPLETE
1 series · 14 of 25 positions shown · non-contrast
Comparison: None.

CLINICAL DATA: Pelvic pain bloating and bowel changes

EXAM:
TRANSABDOMINAL ULTRASOUND OF PELVIS
TECHNIQUE: Transabdominal ultrasound examination of the pelvis was performed
including evaluation of the uterus, ovaries, adnexal regions, and
pelvic cul-de-sac.

[Series 1: us pelvis complete · 14 of 34 slices shown]
[im 1/34]
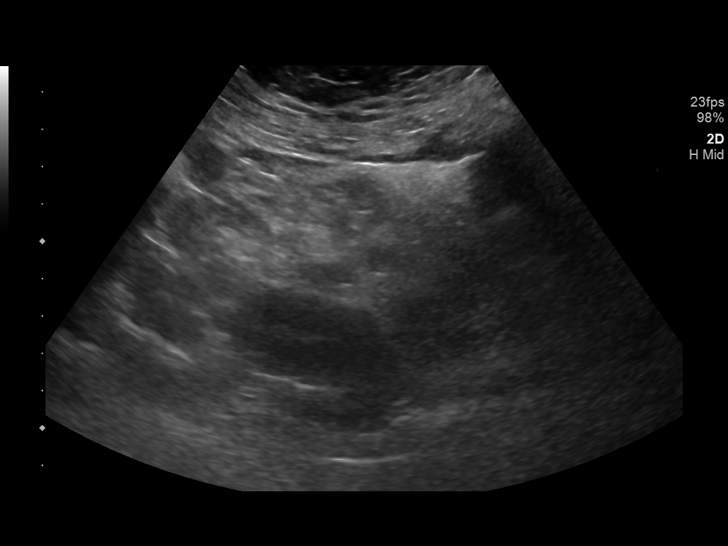
[im 3/34]
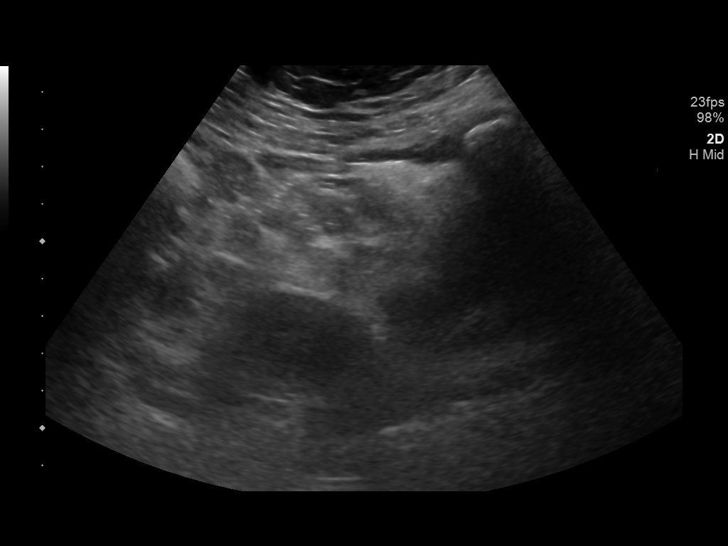
[im 6/34]
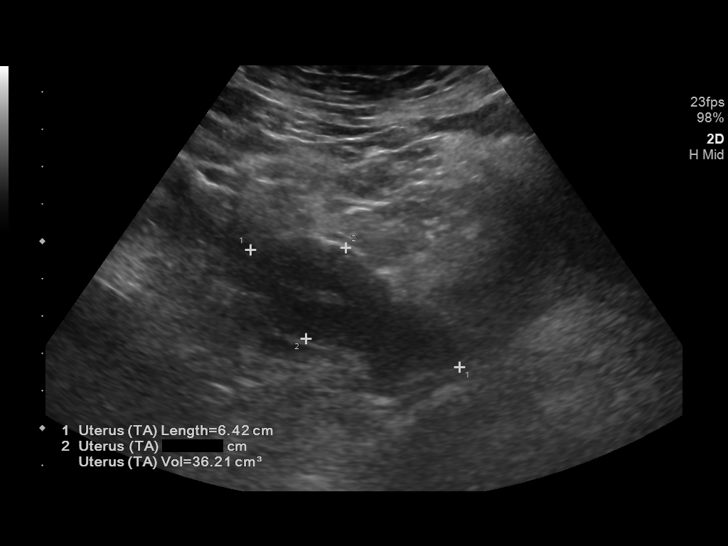
[im 9/34]
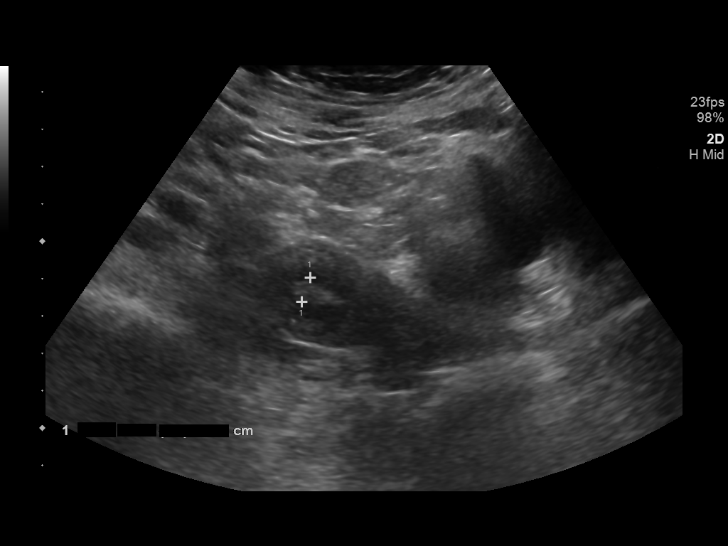
[im 12/34]
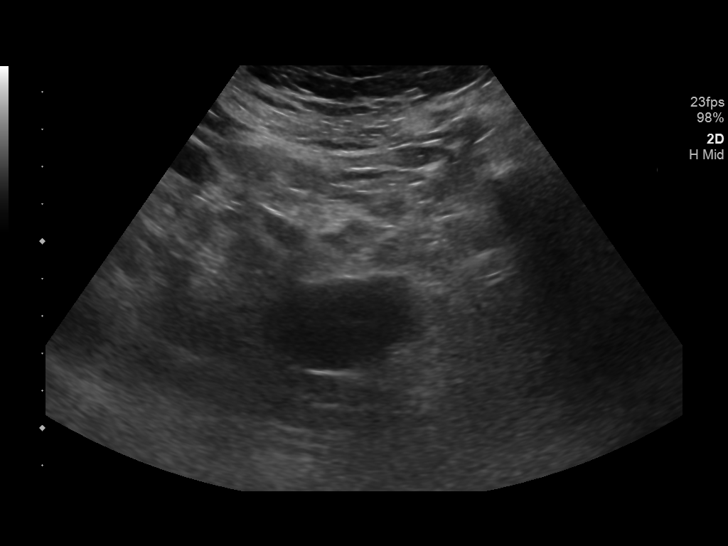
[im 13/34]
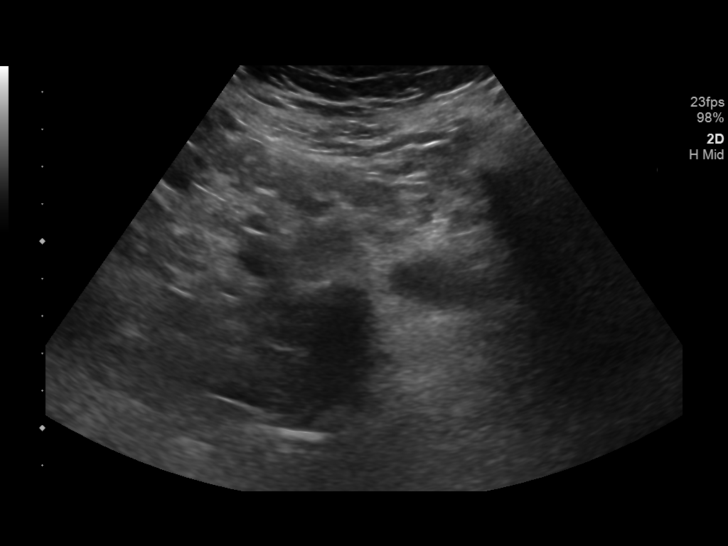
[im 16/34]
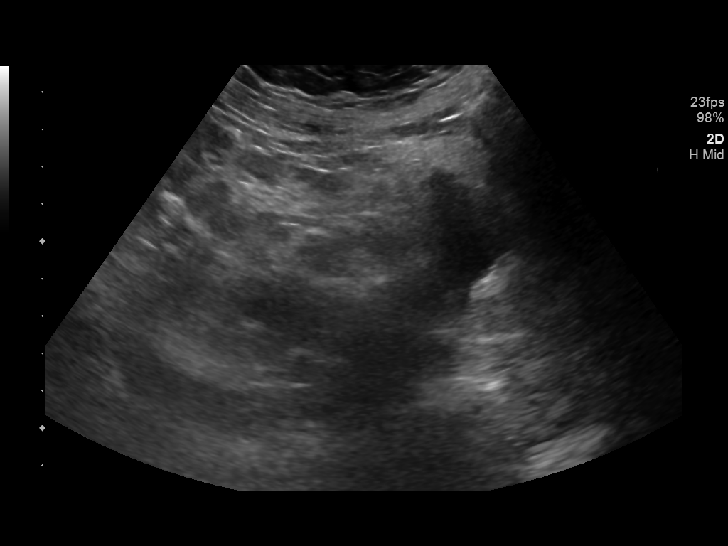
[im 18/34]
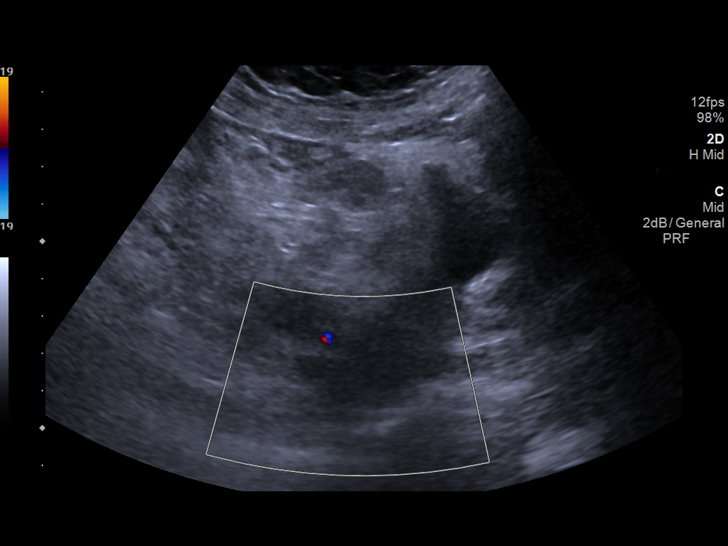
[im 21/34]
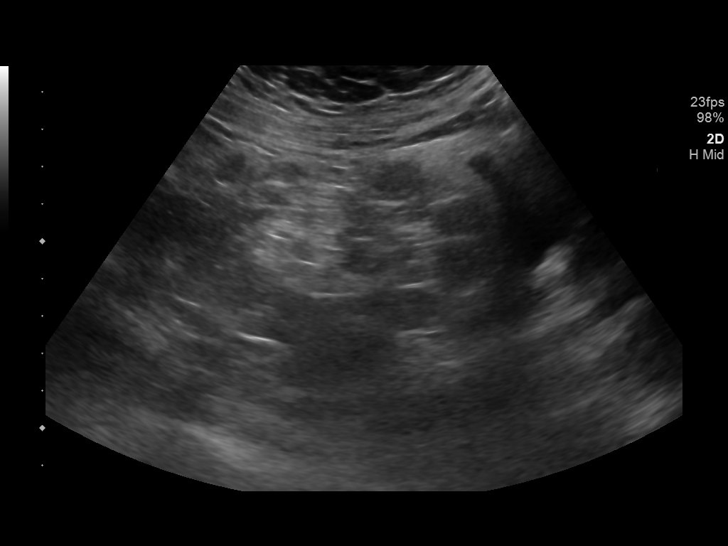
[im 23/34]
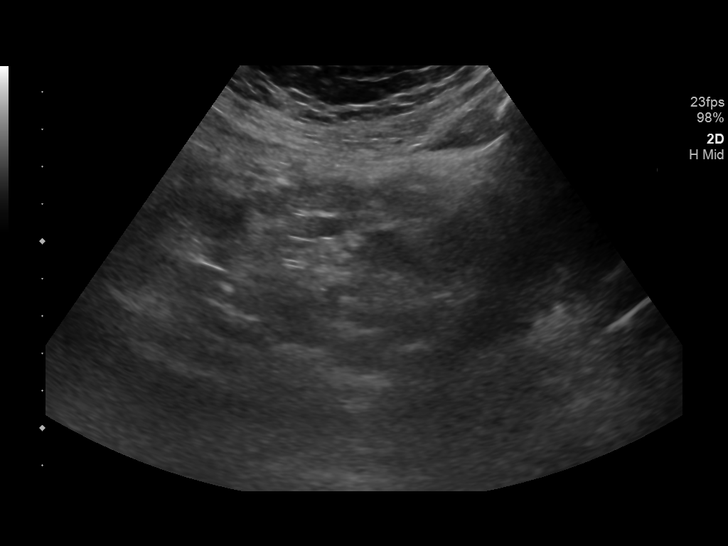
[im 25/34]
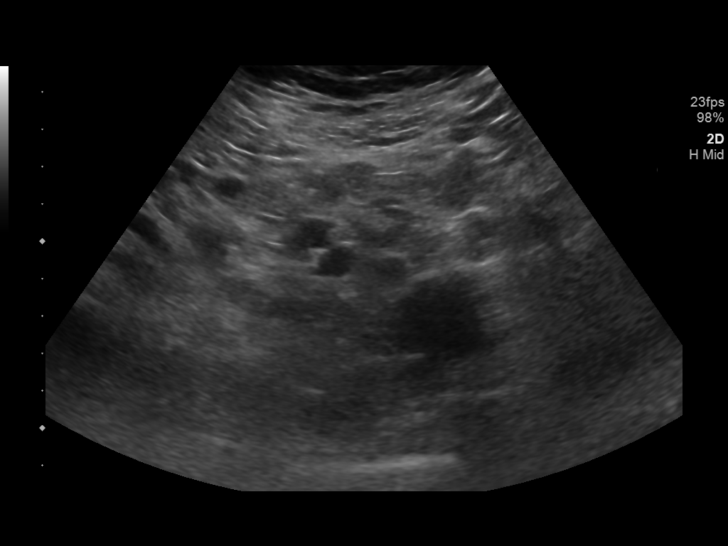
[im 28/34]
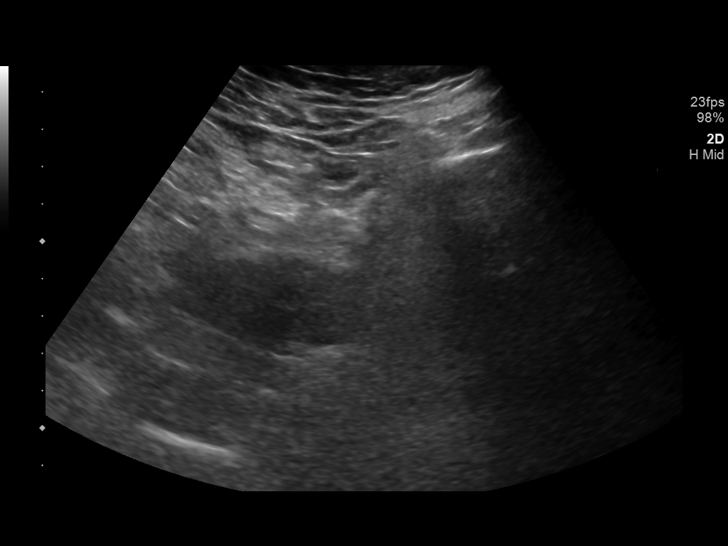
[im 31/34]
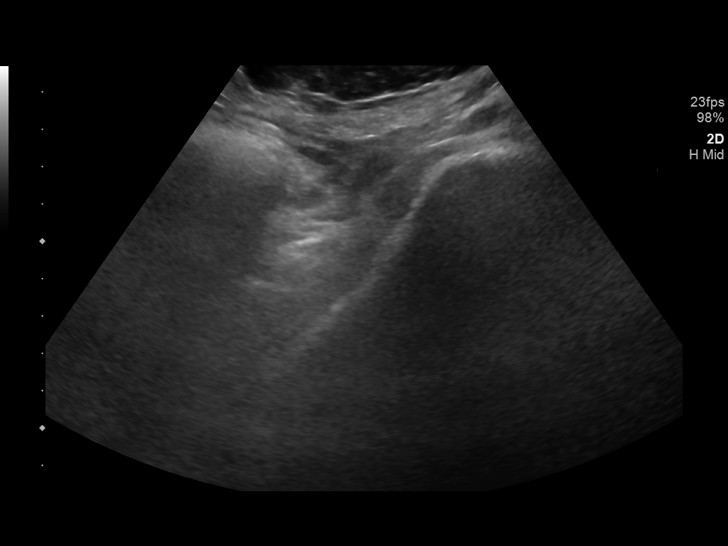
[im 34/34]
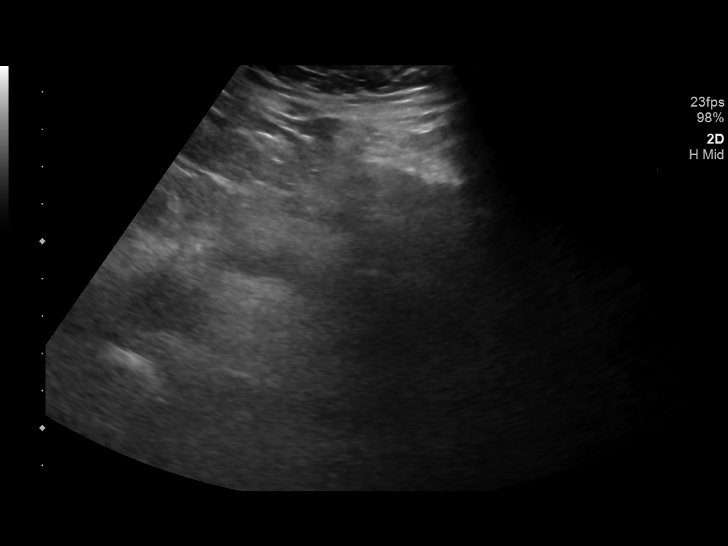

[14 of 25 positions shown; findings below may reference images not displayed]

FINDINGS: Uterus

Measurements: 6.4 x 2.7 x 4.1 cm = volume: 36.2 mL. No fibroids or
other mass visualized.

Endometrium

Thickness: 6.9 mm.  No focal abnormality visualized.

Right ovary

Measurements: 2.5 x 1.5 x 2 cm = volume: 3.7 mL. Normal
appearance/no adnexal mass.

Left ovary

Not seen

Other findings:  No abnormal free fluid.
IMPRESSION: Nonvisualized left ovary.  Otherwise negative pelvic ultrasound

## 2022-08-22 ENCOUNTER — Other Ambulatory Visit: Payer: Self-pay | Admitting: Family Medicine

## 2022-08-22 NOTE — Telephone Encounter (Signed)
Medication Refill - Medication: ibuprofen (ADVIL) 200 MG tablet  Has the patient contacted their pharmacy? No.   Preferred Pharmacy (with phone number or street name):  [facility] Phone: [phone]  Fax: [phone]     Has the patient been seen for an appointment in the last year OR does the patient have an upcoming appointment? Yes.    Agent: Please be advised that RX refills may take up to 3 business days. We ask that you follow-up with your pharmacy.

## 2022-08-23 NOTE — Telephone Encounter (Signed)
Requested medication (s) are due for refill today: Yes  Requested medication (s) are on the active medication list: Yes  Last refill:  10/02/18  Future visit scheduled: No  Notes to clinic:  Historical provider.    Requested Prescriptions  Pending Prescriptions Disp Refills   ibuprofen (ADVIL) 200 MG tablet 30 tablet     Sig: Take 3-4 tablets (600-800 mg total) by mouth every 8 (eight) hours as needed (pain).     Analgesics:  NSAIDS Failed - 08/22/2022  9:59 AM      Failed - Manual Review: Labs are only required if the patient has taken medication for more than 8 weeks.      Passed - Cr in normal range and within 360 days    Creatinine, Ser  Date Value Ref Range Status  02/17/2022 0.94 0.57 - 1.00 mg/dL Final         Passed - HGB in normal range and within 360 days    Hemoglobin  Date Value Ref Range Status  02/17/2022 12.4 11.1 - 15.9 g/dL Final         Passed - PLT in normal range and within 360 days    Platelets  Date Value Ref Range Status  02/17/2022 261 150 - 450 x10E3/uL Final         Passed - HCT in normal range and within 360 days    Hematocrit  Date Value Ref Range Status  02/17/2022 36.4 34.0 - 46.6 % Final         Passed - eGFR is 30 or above and within 360 days    GFR calc Af Amer  Date Value Ref Range Status  01/21/2019 66 >59 mL/min/1.73 Final   GFR, Estimated  Date Value Ref Range Status  08/19/2020 >60 >60 mL/min Final    Comment:    (NOTE) Calculated using the CKD-EPI Creatinine Equation (2021)    eGFR  Date Value Ref Range Status  02/17/2022 68 >59 mL/min/1.73 Final         Passed - Patient is not pregnant      Passed - Valid encounter within last 12 months    Recent Outpatient Visits           3 months ago Need for prophylactic vaccination and inoculation against single bacterial disease   [facility] Malva Limes, MD   5 months ago Acute non-recurrent frontal sinusitis    [facility] Alfredia Ferguson, PA-C   6 months ago Annual physical exam   [facility] Malva Limes, MD   6 months ago Back strain, initial encounter   [facility] Malva Limes, MD   7 months ago Papular urticaria   [facility] [place] Malva Limes, MD

## 2022-08-25 ENCOUNTER — Other Ambulatory Visit: Payer: Self-pay

## 2022-08-25 ENCOUNTER — Other Ambulatory Visit: Payer: Self-pay | Admitting: Family Medicine

## 2022-08-25 DIAGNOSIS — F988 Other specified behavioral and emotional disorders with onset usually occurring in childhood and adolescence: Secondary | ICD-10-CM

## 2022-08-25 MED FILL — Amphetamine-Dextroamphetamine Cap ER 24HR 20 MG: ORAL | 30 days supply | Qty: 30 | Fill #0 | Status: AC

## 2022-10-31 ENCOUNTER — Telehealth: Payer: Self-pay

## 2022-10-31 NOTE — Telephone Encounter (Signed)
Copied from CRM [id number]. Topic: General - Other >> Oct 31, 2022  2:31 PM Santiya F wrote: Reason for CRM: Pt would like to have her A1C checked as well as other standard labs during her physical appointment on 12/04/22.

## 2022-11-01 ENCOUNTER — Other Ambulatory Visit: Payer: Self-pay | Admitting: Family Medicine

## 2022-11-01 DIAGNOSIS — F988 Other specified behavioral and emotional disorders with onset usually occurring in childhood and adolescence: Secondary | ICD-10-CM

## 2022-11-02 ENCOUNTER — Other Ambulatory Visit: Payer: Self-pay

## 2022-11-02 ENCOUNTER — Other Ambulatory Visit: Payer: Self-pay | Admitting: Family Medicine

## 2022-11-02 DIAGNOSIS — F988 Other specified behavioral and emotional disorders with onset usually occurring in childhood and adolescence: Secondary | ICD-10-CM

## 2022-11-02 NOTE — Telephone Encounter (Signed)
Requested medications are due for refill today.  yes  Requested medications are on the active medications list.  yes  Last refill. 08/25/2022 #30 0 rf  Future visit scheduled.   yes  Notes to clinic.  Refill not delegated.    Requested Prescriptions  Pending Prescriptions Disp Refills   amphetamine-dextroamphetamine (ADDERALL XR) 20 MG 24 hr capsule 30 capsule 0    Sig: Take 1 capsule (20 mg total) by mouth daily.     Not Delegated - Psychiatry:  Stimulants/ADHD Failed - 11/02/2022  8:46 AM      Failed - This refill cannot be delegated      Failed - Urine Drug Screen completed in last 360 days      Failed - Valid encounter within last 6 months    Recent Outpatient Visits           5 months ago Need for prophylactic vaccination and inoculation against single bacterial disease   [facility] Malva Limes, MD   8 months ago Acute non-recurrent frontal sinusitis   [place] [facility] Alfredia Ferguson, PA-C   8 months ago Annual physical exam   [facility] Malva Limes, MD   9 months ago Back strain, initial encounter   [facility] Malva Limes, MD   9 months ago Papular urticaria   [place] [facility] Malva Limes, MD       Future Appointments             In 1 month [place], Demetrios Isaacs, MD [facility]            Passed - Last BP in normal range    BP Readings from Last 1 Encounters:  03/07/22 122/64         Passed - Last Heart Rate in normal range    Pulse Readings from Last 1 Encounters:  03/07/22 81

## 2022-11-03 ENCOUNTER — Other Ambulatory Visit: Payer: Self-pay

## 2022-11-03 MED FILL — Amphetamine-Dextroamphetamine Cap ER 24HR 20 MG: ORAL | 30 days supply | Qty: 30 | Fill #0 | Status: CN

## 2022-11-23 ENCOUNTER — Other Ambulatory Visit: Payer: Self-pay

## 2022-12-04 ENCOUNTER — Encounter: Payer: Self-pay | Admitting: Family Medicine

## 2022-12-04 ENCOUNTER — Ambulatory Visit (INDEPENDENT_AMBULATORY_CARE_PROVIDER_SITE_OTHER): Payer: 59 | Admitting: Family Medicine

## 2022-12-04 VITALS — BP 119/76 | HR 64 | Ht 62.0 in | Wt 169.0 lb

## 2022-12-04 DIAGNOSIS — K760 Fatty (change of) liver, not elsewhere classified: Secondary | ICD-10-CM | POA: Diagnosis not present

## 2022-12-04 DIAGNOSIS — Z0000 Encounter for general adult medical examination without abnormal findings: Secondary | ICD-10-CM

## 2022-12-04 DIAGNOSIS — Z974 Presence of external hearing-aid: Secondary | ICD-10-CM | POA: Diagnosis not present

## 2022-12-04 DIAGNOSIS — K5909 Other constipation: Secondary | ICD-10-CM

## 2022-12-04 DIAGNOSIS — Z780 Asymptomatic menopausal state: Secondary | ICD-10-CM | POA: Diagnosis not present

## 2022-12-04 DIAGNOSIS — F988 Other specified behavioral and emotional disorders with onset usually occurring in childhood and adolescence: Secondary | ICD-10-CM | POA: Diagnosis not present

## 2022-12-04 DIAGNOSIS — Z23 Encounter for immunization: Secondary | ICD-10-CM | POA: Diagnosis not present

## 2022-12-04 DIAGNOSIS — B351 Tinea unguium: Secondary | ICD-10-CM

## 2022-12-04 DIAGNOSIS — R7303 Prediabetes: Secondary | ICD-10-CM | POA: Diagnosis not present

## 2022-12-04 DIAGNOSIS — Z13820 Encounter for screening for osteoporosis: Secondary | ICD-10-CM

## 2022-12-04 NOTE — Patient Instructions (Signed)
Please review the attached list of medications and notify my office if there are any errors.  ° °I recommend that you get the Prevnar 20 vaccine to protect yourself from certain dangerous strains of pneumonia. You can get Prevnar 20 at your pharmacy, or call our office at [phone] at your earliest convenience to schedule this vaccine.   °

## 2022-12-04 NOTE — Progress Notes (Signed)
Complete physical exam   Patient: Krystal Woodward   DOB: 13-Oct-1955   67 y.o. Female  MRN: [id number] Visit Date: 12/04/2022  Today's healthcare provider: [name], MD   Chief Complaint  Patient presents with   Annual Exam   Subjective    Krystal Woodward is a 67 y.o. female who presents today for a complete physical exam.  She reports consuming a  healthy  diet.  She generally feels well. She reports sleeping fairly well. She does not have additional problems to discuss today.   History of Present Illness   She is concerned about a thickened, discolored toenail, suspecting it may be a fungal infection. She has not previously seen a podiatrist for this issue. 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 Despite this, she continues to exercise at the gym and notes that her knee symptoms improve with weight loss.  The patient has recently gained weight after visiting family and indulging in less healthy food choices, including chicken, pasta, and Doritos. She expresses concern about her upcoming A1c test due to these dietary changes.  She has a history of chronic constipation, which has improved with dietary changes, including the addition of [place] yogurt, fresh fruit, and shredded wheat to her breakfast routine. She has stopped taking Linzess due to unpleasant side effects, including excessive gas.  The patient is currently taking a daily regimen of vitamin D (5000 IU), a multivitamin, two magnesium pills, krill oil, turmeric, and a baby aspirin.   She has history of pre-diabetes and is due for A1c.     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   Breast mass 01/24/2011   Chest pain    Elevated liver enzymes    GERD (gastroesophageal reflux disease)    h/o   Hyperprolactinemia  (HCC) 7829;5621   Normal MRI   Vitamin D deficiency    Past Surgical History:  Procedure Laterality Date   BREAST BIOPSY Right 06/17/15   Inflammed Fibrocyst   BREAST CYST EXCISION  02/27/2011   Procedure: CYST EXCISION BREAST;  Surgeon: Currie Paris, MD;  Location: [facility];  Service: General;  Laterality: Left;  Needle localization removal left breast mass   BREAST EXCISIONAL BIOPSY Left    BUNIONECTOMY     both feet   COLONOSCOPY WITH PROPOFOL N/A 09/10/2019   Procedure: COLONOSCOPY WITH PROPOFOL;  Surgeon: Toledo, Boykin Nearing, MD;  Location: [facility];  Service: Gastroenterology;  Laterality: N/A;   INCISION AND DRAINAGE ABSCESS Right 07/28/2015   Procedure: INCISION AND DRAINAGE RIGHT BREAST  ABSCESS;  Surgeon: Manus Rudd, MD;  Location: [facility];  Service: General;  Laterality: Right;   SHOULDER ARTHROSCOPY WITH ROTATOR CUFF REPAIR AND OPEN BICEPS TENODESIS  10/10/2018   Procedure: SHOULDER ARTHROSCOPY WITH OPEN ROTATOR CUFF REPAIR AND MINI OPEN BICEPS TENODESIS;  Surgeon: Christena Flake, MD;  Location: [facility];  Service: Orthopedics;;   SHOULDER ARTHROSCOPY WITH SUBACROMIAL DECOMPRESSION  10/10/2018   Procedure: SHOULDER ARTHROSCOPY WITH SUBACROMIAL DECOMPRESSION;  Surgeon: Christena Flake, MD;  Location: [facility];  Service: Orthopedics;;   TUBAL LIGATION  1996   Social History   Socioeconomic History   Marital status: Married    Spouse name: Not on file  Number of children: 4   Years of education: Not on file   Highest education level: Not on file  Occupational History   Occupation: CNA     Comment: [employer]  Tobacco Use   Smoking status: Never   Smokeless tobacco: Never  Vaping Use   Vaping status: Never Used  Substance and Sexual Activity   Alcohol use: No   Drug use: No   Sexual activity: Not on file  Other Topics Concern   Not on file  Social History Narrative   Not on file   Social Determinants of Health    Financial Resource Strain: Not on file  Food Insecurity: Not on file  Transportation Needs: Not on file  Physical Activity: Not on file  Stress: Not on file  Social Connections: Not on file  Intimate Partner Violence: Not on file   Family Status  Relation Name Status   Mother  Deceased at age 41   Father  Deceased at age 75's   Sister  Deceased   PGM  (Not Specified)   Neg Hx  (Not Specified)  No partnership data on file   Family History  Problem Relation Age of Onset   Cancer Mother        lymph nodes   Transient ischemic attack Mother    Diabetes Father    Cancer Sister 43       2015 Rectal cancer, spread to Lymph node, lung cancer, non-smoker   Asthma Paternal Grandmother    Diabetes Paternal Grandmother    Breast cancer Neg Hx    Allergies  Allergen Reactions   Amitiza [Lubiprostone] Nausea Only    Patient Care Team: Malva Limes, MD as PCP - General (Family Medicine) Poggi, Excell Seltzer, MD as Consulting Physician (Surgery)   Medications: Outpatient Medications Prior to Visit  Medication Sig   KRILL OIL PO Take by mouth.   acetaminophen (TYLENOL) 500 MG tablet Take 1,000 mg by mouth every 8 (eight) hours as needed (pain).   amphetamine-dextroamphetamine (ADDERALL XR) 20 MG 24 hr capsule Take 1 capsule (20 mg total) by mouth daily.   aspirin 81 MG tablet Take 81 mg by mouth daily.   Cholecalciferol 125 MCG (5000 UT) capsule Take 5,000 Units by mouth daily.   ibuprofen (ADVIL) 200 MG tablet Take 600-800 mg by mouth every 8 (eight) hours as needed (pain).   linaclotide (LINZESS) 72 MCG capsule Take 1 capsule (72 mcg total) by mouth daily before breakfast. (Patient not taking: Reported on 12/04/2022)   Multiple Vitamins-Minerals (CENTRUM SILVER ULTRA WOMENS PO) Take 1 capsule by mouth daily.   Omega-3 Fatty Acids (FISH OIL PO) Take 1,500 Units by mouth daily. (Patient not taking: Reported on 12/04/2022)   TURMERIC CURCUMIN PO Take 1 tablet by mouth daily.   No  facility-administered medications prior to visit.    Review of Systems  Constitutional:  Negative for chills, fatigue and fever.  HENT:  Negative for congestion, ear pain, rhinorrhea, sneezing and sore throat.   Eyes: Negative.  Negative for pain and redness.  Respiratory:  Negative for cough, shortness of breath and wheezing.   Cardiovascular:  Negative for chest pain and leg swelling.  Gastrointestinal:  Negative for abdominal pain, blood in stool, constipation, diarrhea and nausea.  Endocrine: Negative for polydipsia and polyphagia.  Genitourinary: Negative.  Negative for dysuria, flank pain, hematuria, pelvic pain, vaginal bleeding and vaginal discharge.  Musculoskeletal:  Negative for arthralgias, back pain, gait problem and joint swelling.  Skin:  Negative for rash.  Neurological: Negative.  Negative for dizziness, tremors, seizures, weakness, light-headedness, numbness and headaches.  Hematological:  Negative for adenopathy.  Psychiatric/Behavioral: Negative.  Negative for behavioral problems, confusion and dysphoric mood. The patient is not nervous/anxious and is not hyperactive.       Objective    BP 119/76 (BP Location: Left Arm, Patient Position: Sitting, Cuff Size: Normal)   Pulse 64   Ht 5\' 2"  (1.575 m)   Wt 169 lb (76.7 kg)   BMI 30.91 kg/m    Physical Exam   General Appearance:    Mildly obese female. Alert, cooperative, in no acute distress, appears stated age   Head:    Normocephalic, without obvious abnormality, atraumatic  Eyes:    PERRL, conjunctiva/corneas clear, EOM's intact, fundi    benign, both eyes  Ears:    Normal TM's and external ear canals, both ears. Wears hearing aides  Nose:   Nares normal, septum midline, mucosa normal, no drainage    or sinus tenderness  Throat:   Lips, mucosa, and tongue normal; teeth and gums normal  Neck:   Supple, symmetrical, trachea midline, no adenopathy;    thyroid:  no enlargement/tenderness/nodules; no carotid    bruit or JVD  Back:     Symmetric, no curvature, ROM normal, no CVA tenderness  Lungs:     Clear to auscultation bilaterally, respirations unlabored  Chest Wall:    No tenderness or deformity   Heart:    Normal heart rate. Normal rhythm.  2/6 systolic murmur  Breast Exam:    deferred  Abdomen:     Soft, non-tender, bowel sounds active all four quadrants,    no masses, no organomegaly  Pelvic:    deferred  Extremities:   All extremities are intact. No cyanosis or edema  Pulses:   2+ and symmetric all extremities  Skin:   Skin color, texture, turgor normal, no rashes or lesions. Yellow thickened dysmorphic right middle toe.   Lymph nodes:   Cervical, supraclavicular, and axillary nodes normal  Neurologic:   CNII-XII intact, normal strength, sensation and reflexes    throughout     Last depression screening scores    02/17/2022    1:45 PM 01/31/2022   10:49 AM 01/13/2022   11:21 AM  PHQ 2/9 Scores  PHQ - 2 Score 0 0 0  PHQ- 9 Score 0 0 0   Last fall risk screening    02/17/2022    1:45 PM  Fall Risk   Falls in the past year? 0  Number falls in past yr: 0  Injury with Fall? 0  Risk for fall due to : No Fall Risks  Follow up Falls evaluation completed   Last Audit-C alcohol use screening    02/17/2022    1:45 PM  Alcohol Use Disorder Test (AUDIT)  1. How often do you have a drink containing alcohol? 0  2. How many drinks containing alcohol do you have on a typical day when you are drinking? 0  3. How often do you have six or more drinks on one occasion? 0  AUDIT-C Score 0   A score of 3 or more in women, and 4 or more in men indicates increased risk for alcohol abuse, EXCEPT if all of the points are from question 1    Assessment & Plan    Routine Health Maintenance and Physical Exam  Exercise Activities and Dietary recommendations  Goals   None     Immunization  History  Administered Date(s) Administered   COVID-19, mRNA, vaccine(Comirnaty)12 years and older  05/15/2022   Influenza,inj,Quad PF,6+ Mos 01/16/2017, 01/18/2018, 12/31/2018, 01/10/2021, 01/13/2022   PFIZER(Purple Top)SARS-COV-2 Vaccination 07/28/2019, 08/19/2019   Tdap 01/28/2010   Zoster Recombinant(Shingrix) 05/15/2022, 07/19/2022    Health Maintenance  Topic Date Due   DTaP/Tdap/Td (2 - Td or Tdap) 01/29/2020   Pneumonia Vaccine 98+ Years old (1 of 1 - PCV) Never done   DEXA SCAN  Never done   COVID-19 Vaccine (4 - 2023-24 season) 09/13/2022   INFLUENZA VACCINE  11/23/2022   PAP SMEAR-Modifier  08/05/2023   MAMMOGRAM  07/06/2024   Colonoscopy  09/09/2024   Hepatitis C Screening  Completed   HIV Screening  Completed   Zoster Vaccines- Shingrix  Completed   HPV VACCINES  Aged Out    Discussed health benefits of physical activity, and encouraged her to engage in regular exercise appropriate for her age and condition.   - HgB A1c - Lipid panel - Comprehensive metabolic panel  2. Encounter for osteoporosis screening in asymptomatic postmenopausal patient  - DG Bone Density; Future  3. Need for vaccination against Streptococcus pneumoniae  - Pneumococcal conjugate vaccine 20-valent (PCV20)  4. Pre-diabetes  - HgB A1c - Comprehensive metabolic panel  5. 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   7. Chronic constipation Managed with dietary Improvements, no longer taking Linzess.   8. Onychomycosis of toenail  - Ambulatory referral to Podiatry  9. Wears hearing aid      [name], MD  [facility] [phone] (phone) [phone] (fax)  [facility]

## 2022-12-14 ENCOUNTER — Ambulatory Visit (INDEPENDENT_AMBULATORY_CARE_PROVIDER_SITE_OTHER): Payer: 59 | Admitting: Podiatry

## 2022-12-14 DIAGNOSIS — B351 Tinea unguium: Secondary | ICD-10-CM | POA: Diagnosis not present

## 2022-12-14 NOTE — Progress Notes (Signed)
  Subjective:  Patient ID: Krystal Woodward, female    DOB: 05/15/1955,  MRN: [id number]  Chief Complaint  Patient presents with   Nail Problem    67 y.o. female presents with the above complaint.  Patient presents with right second thickened elongated dystrophic mycotic toenails x 1.  She wanted to get it evaluated she wanted discuss treatment options for nail fungus she has tried some over-the-counter options none of which has helped.  She denies anyone else prior to seeing me.  She would like to discuss treatment options for this.   Review of Systems: Negative except as noted in the HPI. Denies N/V/F/Ch.  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   Breast mass 01/24/2011   Chest pain    Elevated liver enzymes    GERD (gastroesophageal reflux disease)    h/o   Hyperprolactinemia (HCC) 7829;5621   Normal MRI   Vitamin D deficiency     Current Outpatient Medications:    acetaminophen (TYLENOL) 500 MG tablet, Take 1,000 mg by mouth every 8 (eight) hours as needed (pain)., Disp: , Rfl:    amphetamine-dextroamphetamine (ADDERALL XR) 20 MG 24 hr capsule, Take 1 capsule (20 mg total) by mouth daily., Disp: 30 capsule, Rfl: 0   aspirin 81 MG tablet, Take 81 mg by mouth daily., Disp: , Rfl:    Cholecalciferol 125 MCG (5000 UT) capsule, Take 5,000 Units by mouth daily., Disp: , Rfl:    ibuprofen (ADVIL) 200 MG tablet, Take 600-800 mg by mouth every 8 (eight) hours as needed (pain)., Disp: , Rfl:    KRILL OIL PO, Take by mouth., Disp: , Rfl:    Multiple Vitamins-Minerals (CENTRUM SILVER ULTRA WOMENS PO), Take 1 capsule by mouth daily., Disp: , Rfl:    TURMERIC CURCUMIN PO, Take 1 tablet by mouth daily., Disp: , Rfl:   Social History   Tobacco Use  Smoking Status Never  Smokeless Tobacco Never    Allergies  Allergen Reactions   Amitiza [Lubiprostone] Nausea Only   Objective:  There were no vitals filed for this visit. There is no height or  weight on file to calculate BMI. Constitutional Well developed. Well nourished.  Vascular Dorsalis pedis pulses palpable bilaterally. Posterior tibial pulses palpable bilaterally. Capillary refill normal to all digits.  No cyanosis or clubbing noted. Pedal hair growth normal.  Neurologic Normal speech. Oriented to person, place, and time. Epicritic sensation to light touch grossly present bilaterally.  Dermatologic Nails right thickened elongated dystrophic mycotic toenails second digit.  Fungus noted. Skin within normal limits  Orthopedic: Normal joint ROM without pain or crepitus bilaterally. No visible deformities. No bony tenderness.   Radiographs: None Assessment:   1. Nail fungus   2. Onychomycosis due to dermatophyte    Plan:  Patient was evaluated and treated and all questions answered.  Right second digit onychomycosis -Educated the patient on the etiology of onychomycosis and various treatment options associated with improving the fungal load.  I explained to the patient that there is 3 treatment options available to treat the onychomycosis including topical, p.o., laser treatment.  Patient elected to think about treatment options will get back to me when she is ready.   No follow-ups on file.

## 2022-12-28 ENCOUNTER — Other Ambulatory Visit: Payer: Self-pay | Admitting: Family Medicine

## 2022-12-28 DIAGNOSIS — F988 Other specified behavioral and emotional disorders with onset usually occurring in childhood and adolescence: Secondary | ICD-10-CM

## 2022-12-28 NOTE — Telephone Encounter (Signed)
Medication Refill - Medication: amphetamine-dextroamphetamine (ADDERALL XR) 20 MG 24 hr capsule   Has the patient contacted their pharmacy? No. (Agent: If no, request that the patient contact the pharmacy for the refill. If patient does not wish to contact the pharmacy document the reason why and proceed with request.) (Agent: If yes, when and what did the pharmacy advise?)  Preferred Pharmacy (with phone number or street name):  [facility] - [facility]  Has the patient been seen for an appointment in the last year OR does the patient have an upcoming appointment? Yes.    Pt states she is going out of town on Friday, and hopes to get this asap.

## 2022-12-29 ENCOUNTER — Other Ambulatory Visit: Payer: Self-pay

## 2022-12-29 MED ORDER — AMPHETAMINE-DEXTROAMPHET ER 20 MG PO CP24
20.0000 mg | ORAL_CAPSULE | Freq: Every day | ORAL | 0 refills | Status: DC
Start: 2022-12-29 — End: 2023-03-14
  Filled 2022-12-29 – 2023-01-18 (×2): qty 30, 30d supply, fill #0

## 2022-12-29 NOTE — Telephone Encounter (Signed)
Requested medication (s) are due for refill today -yes  Requested medication (s) are on the active medication list -yes  Future visit scheduled -no  Last refill: 11/03/22 #30  Notes to clinic: non delegated Rx  Requested Prescriptions  Pending Prescriptions Disp Refills   amphetamine-dextroamphetamine (ADDERALL XR) 20 MG 24 hr capsule 30 capsule 0    Sig: Take 1 capsule (20 mg total) by mouth daily.     Not Delegated - Psychiatry:  Stimulants/ADHD Failed - 12/28/2022 11:18 AM      Failed - This refill cannot be delegated      Failed - Urine Drug Screen completed in last 360 days      Passed - Last BP in normal range    BP Readings from Last 1 Encounters:  12/04/22 119/76         Passed - Last Heart Rate in normal range    Pulse Readings from Last 1 Encounters:  12/04/22 64         Passed - Valid encounter within last 6 months    Recent Outpatient Visits           3 weeks ago Annual physical exam   [facility] Malva Limes, MD   7 months ago Need for prophylactic vaccination and inoculation against single bacterial disease   [facility] Malva Limes, MD   9 months ago Acute non-recurrent frontal sinusitis   [facility] Alfredia Ferguson, PA-C   10 months ago Annual physical exam   [facility] Malva Limes, MD   11 months ago Back strain, initial encounter   [facility] Malva Limes, MD                 Requested Prescriptions  Pending Prescriptions Disp Refills   amphetamine-dextroamphetamine (ADDERALL XR) 20 MG 24 hr capsule 30 capsule 0    Sig: Take 1 capsule (20 mg total) by mouth daily.     Not Delegated - Psychiatry:  Stimulants/ADHD Failed - 12/28/2022 11:18 AM      Failed - This refill cannot be delegated      Failed - Urine Drug Screen completed in last 360 days      Passed - Last BP in normal range     BP Readings from Last 1 Encounters:  12/04/22 119/76         Passed - Last Heart Rate in normal range    Pulse Readings from Last 1 Encounters:  12/04/22 64         Passed - Valid encounter within last 6 months    Recent Outpatient Visits           3 weeks ago Annual physical exam   [facility] Malva Limes, MD   7 months ago Need for prophylactic vaccination and inoculation against single bacterial disease   [facility] Malva Limes, MD   9 months ago Acute non-recurrent frontal sinusitis   [facility] Alfredia Ferguson, PA-C   10 months ago Annual physical exam   [facility] Malva Limes, MD   11 months ago Back strain, initial encounter   [facility] Sherrie Mustache, Demetrios Isaacs, MD

## 2023-01-01 ENCOUNTER — Other Ambulatory Visit: Payer: Self-pay

## 2023-01-08 ENCOUNTER — Other Ambulatory Visit: Payer: Self-pay

## 2023-01-09 ENCOUNTER — Other Ambulatory Visit: Payer: Self-pay

## 2023-01-17 ENCOUNTER — Other Ambulatory Visit: Payer: Self-pay

## 2023-01-18 ENCOUNTER — Other Ambulatory Visit: Payer: Self-pay

## 2023-01-24 ENCOUNTER — Other Ambulatory Visit: Payer: Self-pay

## 2023-01-24 MED ORDER — FLUAD 0.5 ML IM SUSY
PREFILLED_SYRINGE | INTRAMUSCULAR | 0 refills | Status: DC
  Filled 2023-01-24: qty 0.5, 1d supply, fill #0

## 2023-01-26 ENCOUNTER — Other Ambulatory Visit: Payer: Self-pay

## 2023-01-26 ENCOUNTER — Other Ambulatory Visit: Payer: Self-pay | Admitting: Podiatry

## 2023-01-26 MED ORDER — TERBINAFINE HCL 250 MG PO TABS
250.0000 mg | ORAL_TABLET | Freq: Every day | ORAL | 0 refills | Status: DC
  Filled 2023-01-26: qty 90, 90d supply, fill #0

## 2023-01-29 ENCOUNTER — Other Ambulatory Visit: Payer: Self-pay

## 2023-01-29 ENCOUNTER — Other Ambulatory Visit (HOSPITAL_COMMUNITY): Payer: Self-pay

## 2023-03-13 ENCOUNTER — Other Ambulatory Visit: Payer: Self-pay | Admitting: Family Medicine

## 2023-03-13 ENCOUNTER — Other Ambulatory Visit: Payer: Self-pay

## 2023-03-13 DIAGNOSIS — F988 Other specified behavioral and emotional disorders with onset usually occurring in childhood and adolescence: Secondary | ICD-10-CM

## 2023-03-14 ENCOUNTER — Other Ambulatory Visit: Payer: Self-pay

## 2023-03-14 MED FILL — Amphetamine-Dextroamphetamine Cap ER 24HR 20 MG: ORAL | 30 days supply | Qty: 30 | Fill #0 | Status: AC

## 2023-03-14 NOTE — Telephone Encounter (Signed)
Requested medication (s) are due for refill today -yes  Requested medication (s) are on the active medication list -yes  Future visit scheduled -no  Last refill: 12/29/22 #30  Notes to clinic: non delegated Rx  Requested Prescriptions  Pending Prescriptions Disp Refills   amphetamine-dextroamphetamine (ADDERALL XR) 20 MG 24 hr capsule 30 capsule 0    Sig: Take 1 capsule (20 mg total) by mouth daily.     Not Delegated - Psychiatry:  Stimulants/ADHD Failed - 03/13/2023  1:49 PM      Failed - This refill cannot be delegated      Failed - Urine Drug Screen completed in last 360 days      Passed - Last BP in normal range    BP Readings from Last 1 Encounters:  12/04/22 119/76         Passed - Last Heart Rate in normal range    Pulse Readings from Last 1 Encounters:  12/04/22 64         Passed - Valid encounter within last 6 months    Recent Outpatient Visits           3 months ago Annual physical exam   [facility] Malva Limes, MD   10 months ago Need for prophylactic vaccination and inoculation against single bacterial disease   [facility] Malva Limes, MD   1 year ago Acute non-recurrent frontal sinusitis   [facility] Alfredia Ferguson, PA-C   1 year ago Annual physical exam   [facility] Malva Limes, MD   1 year ago Back strain, initial encounter   [facility] Malva Limes, MD                 Requested Prescriptions  Pending Prescriptions Disp Refills   amphetamine-dextroamphetamine (ADDERALL XR) 20 MG 24 hr capsule 30 capsule 0    Sig: Take 1 capsule (20 mg total) by mouth daily.     Not Delegated - Psychiatry:  Stimulants/ADHD Failed - 03/13/2023  1:49 PM      Failed - This refill cannot be delegated      Failed - Urine Drug Screen completed in last 360 days      Passed - Last BP in normal range    BP  Readings from Last 1 Encounters:  12/04/22 119/76         Passed - Last Heart Rate in normal range    Pulse Readings from Last 1 Encounters:  12/04/22 64         Passed - Valid encounter within last 6 months    Recent Outpatient Visits           3 months ago Annual physical exam   [facility] Malva Limes, MD   10 months ago Need for prophylactic vaccination and inoculation against single bacterial disease   [facility] Malva Limes, MD   1 year ago Acute non-recurrent frontal sinusitis   [facility] Alfredia Ferguson, PA-C   1 year ago Annual physical exam   [facility] Malva Limes, MD   1 year ago Back strain, initial encounter   [facility] Sherrie Mustache, Demetrios Isaacs, MD

## 2023-03-16 ENCOUNTER — Ambulatory Visit (INDEPENDENT_AMBULATORY_CARE_PROVIDER_SITE_OTHER): Payer: 59 | Admitting: Physician Assistant

## 2023-03-16 ENCOUNTER — Encounter: Payer: Self-pay | Admitting: Physician Assistant

## 2023-03-16 VITALS — BP 126/82 | HR 85 | Resp 16 | Ht 62.0 in | Wt 174.4 lb

## 2023-03-16 DIAGNOSIS — H9201 Otalgia, right ear: Secondary | ICD-10-CM | POA: Diagnosis not present

## 2023-03-16 NOTE — Progress Notes (Signed)
Acute Office Visit   Patient: Krystal Woodward   DOB: [birth year]   67 y.o. Female  MRN: [id number] Visit Date: 03/16/2023  Today's healthcare provider: Oswaldo Conroy Junnie Loschiavo, PA-C  Introduced myself to the patient as a Secondary school teacher and provided education on APPs in clinical practice.    Chief Complaint  Patient presents with   Ear Pain    R ear started yesterday no other sx   Subjective    HPI HPI     Ear Pain    Additional comments: R ear started yesterday no other sx      Last edited by Forde Radon, CMA on 03/16/2023 11:24 AM.      Right ear pain   Reports this started yesterday  She has a hx of wearing hearing aides but does not report further decreased hearing She denies drainage, external ear pain, swelling, chills, fever She reports pain has resolved as of today's apt   Interventions: nothing   Medications: Outpatient Medications Prior to Visit  Medication Sig   acetaminophen (TYLENOL) 500 MG tablet Take 1,000 mg by mouth every 8 (eight) hours as needed (pain).   amphetamine-dextroamphetamine (ADDERALL XR) 20 MG 24 hr capsule Take 1 capsule (20 mg total) by mouth daily.   aspirin 81 MG tablet Take 81 mg by mouth daily.   Cholecalciferol 125 MCG (5000 UT) capsule Take 5,000 Units by mouth daily.   ibuprofen (ADVIL) 200 MG tablet Take 600-800 mg by mouth every 8 (eight) hours as needed (pain).   influenza vaccine adjuvanted (FLUAD) 0.5 ML injection Inject into the muscle.   KRILL OIL PO Take by mouth.   Multiple Vitamins-Minerals (CENTRUM SILVER ULTRA WOMENS PO) Take 1 capsule by mouth daily.   terbinafine (LAMISIL) 250 MG tablet Take 1 tablet (250 mg total) by mouth daily.   TURMERIC CURCUMIN PO Take 1 tablet by mouth daily.   No facility-administered medications prior to visit.    Review of Systems  Constitutional:  Negative for chills and fever.  HENT:  Positive for ear pain. Negative for congestion, ear discharge and hearing loss.           Objective    BP 126/82   Pulse 85   Resp 16   Ht 5\' 2"  (1.575 m)   Wt 174 lb 6.4 oz (79.1 kg)   SpO2 99%   BMI 31.90 kg/m      Physical Exam Vitals reviewed.  Constitutional:      General: She is awake.     Appearance: Normal appearance. She is well-developed and well-groomed.  HENT:     Head: Normocephalic and atraumatic.     Right Ear: Hearing, tympanic membrane and ear canal normal.     Left Ear: Hearing, tympanic membrane and ear canal normal.     Mouth/Throat:     Lips: Pink.     Mouth: Mucous membranes are moist.     Pharynx: Uvula midline. No pharyngeal swelling, oropharyngeal exudate, posterior oropharyngeal erythema, uvula swelling or postnasal drip.  Eyes:     General: Lids are normal. Gaze aligned appropriately.  Pulmonary:     Effort: Pulmonary effort is normal.  Musculoskeletal:     Cervical back: Normal range of motion and neck supple.  Lymphadenopathy:     Head:     Right side of head: No submental, submandibular, preauricular or posterior auricular adenopathy.     Left side of head: No submental, submandibular, preauricular or posterior auricular  adenopathy.     Cervical:     Right cervical: No superficial cervical adenopathy.    Left cervical: No superficial cervical adenopathy.  Neurological:     Mental Status: She is alert.  Psychiatric:        Behavior: Behavior is cooperative.       No results found for any visits on 03/16/23.  Assessment & Plan      No follow-ups on file.      Problem List Items Addressed This Visit   None Visit Diagnoses     Right ear pain    -  Primary      Acute, new concern She reports acute right ear pain that has resolved as of today's apt  Physical exam was reassuring- no evidence of TM erythema, bulging, retraction, canal trauma or inflammation, lymphadenopathy  No sign of otitis media, externa or TM perforation  Reviewed she can alternate OTC Tylenol and ibuprofen as needed for pain per manufacturer's  instructions should pain return Follow up as needed for persistent or progressing symptoms     No follow-ups on file.   I, Magda Muise E Olie Dibert, PA-C, have reviewed all documentation for this visit. The documentation on 03/16/23 for the exam, diagnosis, procedures, and orders are all accurate and complete.   Jacquelin Hawking, MHS, PA-C [facility]

## 2023-05-25 ENCOUNTER — Other Ambulatory Visit: Payer: Self-pay | Admitting: Family Medicine

## 2023-05-25 DIAGNOSIS — F988 Other specified behavioral and emotional disorders with onset usually occurring in childhood and adolescence: Secondary | ICD-10-CM

## 2023-05-25 NOTE — Telephone Encounter (Signed)
Medication Refill -  Most Recent Primary Care Visit:  Provider: Jacquelin Hawking E  Department: [facility]  Visit Type: OFFICE VISIT  Date: 03/16/2023  Medication: amphetamine-dextroamphetamine (ADDERALL XR) 20 MG 24 hr capsule   Has the patient contacted their pharmacy? Yes  (Agent: If yes, when and what did the pharmacy advise?) Contact Office   Is this the correct pharmacy for this prescription? Yes  This is the patient's preferred pharmacy:  [facility] [address] [place] [address] Phone: [phone] Fax: [phone]   Has the prescription been filled recently? Yes  Is the patient out of the medication? Yes  Has the patient been seen for an appointment in the last year OR does the patient have an upcoming appointment? Yes  Can we respond through MyChart? Yes  Agent: Please be advised that Rx refills may take up to 3 business days. We ask that you follow-up with your pharmacy.

## 2023-05-25 NOTE — Telephone Encounter (Signed)
Requested medications are due for refill today.  yes  Requested medications are on the active medications list.  yes  Last refill. 03/14/2023 330 0 rf  Future visit scheduled.   no  Notes to clinic.  Refill not delegated.    Requested Prescriptions  Pending Prescriptions Disp Refills   amphetamine-dextroamphetamine (ADDERALL XR) 20 MG 24 hr capsule 30 capsule 0    Sig: Take 1 capsule (20 mg total) by mouth daily.     Not Delegated - Psychiatry:  Stimulants/ADHD Failed - 05/25/2023  4:57 PM      Failed - This refill cannot be delegated      Failed - Urine Drug Screen completed in last 360 days      Passed - Last BP in normal range    BP Readings from Last 1 Encounters:  03/16/23 126/82         Passed - Last Heart Rate in normal range    Pulse Readings from Last 1 Encounters:  03/16/23 85         Passed - Valid encounter within last 6 months    Recent Outpatient Visits           2 months ago Right ear pain   [facility] M[facility], Oswaldo Conroy, PA-C   5 months ago Annual physical exam   [facility] Malva Limes, MD   1 year ago Need for prophylactic vaccination and inoculation against single bacterial disease   [facility] Malva Limes, MD   1 year ago Acute non-recurrent frontal sinusitis   [facility] Alfredia Ferguson, PA-C   1 year ago Annual physical exam   [facility] Malva Limes, MD

## 2023-05-26 ENCOUNTER — Other Ambulatory Visit: Payer: Self-pay

## 2023-05-26 MED ORDER — AMPHETAMINE-DEXTROAMPHET ER 20 MG PO CP24
20.0000 mg | ORAL_CAPSULE | Freq: Every day | ORAL | 0 refills | Status: DC
  Filled 2023-05-26: qty 30, 30d supply, fill #0

## 2023-05-27 ENCOUNTER — Other Ambulatory Visit: Payer: Self-pay

## 2023-08-08 ENCOUNTER — Other Ambulatory Visit: Payer: Self-pay | Admitting: Family Medicine

## 2023-08-08 DIAGNOSIS — F988 Other specified behavioral and emotional disorders with onset usually occurring in childhood and adolescence: Secondary | ICD-10-CM

## 2023-08-08 NOTE — Telephone Encounter (Unsigned)
 Copied from CRM [id number]. Topic: Clinical - Medication Refill >> Aug 08, 2023  4:58 PM Hobson Luna F wrote: Most Recent Primary Care Visit:  Provider: MECUM, ERIN E  Department: [facility]  Visit Type: OFFICE VISIT  Date: 03/16/2023  Medication: amphetamine-dextroamphetamine (ADDERALL XR) 20 MG 24 hr capsule [[id number]]  Has the patient contacted their pharmacy? Yes  (Agent: If yes, when and what did the pharmacy advise?) contact office   Is this the correct pharmacy for this prescription? Yes  This is the patient's preferred pharmacy:  [facility] [address] [place] [address] Phone: [phone] Fax: [phone]   Has the prescription been filled recently? Yes  Is the patient out of the medication? Yes  Has the patient been seen for an appointment in the last year OR does the patient have an upcoming appointment? Yes  Can we respond through MyChart? Yes  Agent: Please be advised that Rx refills may take up to 3 business days. We ask that you follow-up with your pharmacy.

## 2023-08-09 ENCOUNTER — Other Ambulatory Visit: Payer: Self-pay

## 2023-08-09 MED ORDER — AMPHETAMINE-DEXTROAMPHET ER 20 MG PO CP24
20.0000 mg | ORAL_CAPSULE | Freq: Every day | ORAL | 0 refills | Status: DC
  Filled 2023-08-09: qty 30, 30d supply, fill #0

## 2023-08-09 NOTE — Telephone Encounter (Signed)
 Requested medication (s) are due for refill today - yes  Requested medication (s) are on the active medication list -yes  Future visit scheduled -yes  Last refill: 05/26/23 #30  Notes to clinic: non delegated Rx  Requested Prescriptions  Pending Prescriptions Disp Refills   amphetamine-dextroamphetamine (ADDERALL XR) 20 MG 24 hr capsule 30 capsule 0    Sig: Take 1 capsule (20 mg total) by mouth daily.     Not Delegated - Psychiatry:  Stimulants/ADHD Failed - 08/09/2023  9:20 AM      Failed - This refill cannot be delegated      Failed - Urine Drug Screen completed in last 360 days      Failed - Valid encounter within last 6 months    Recent Outpatient Visits   None            Passed - Last BP in normal range    BP Readings from Last 1 Encounters:  03/16/23 126/82         Passed - Last Heart Rate in normal range    Pulse Readings from Last 1 Encounters:  03/16/23 85            Requested Prescriptions  Pending Prescriptions Disp Refills   amphetamine-dextroamphetamine (ADDERALL XR) 20 MG 24 hr capsule 30 capsule 0    Sig: Take 1 capsule (20 mg total) by mouth daily.     Not Delegated - Psychiatry:  Stimulants/ADHD Failed - 08/09/2023  9:20 AM      Failed - This refill cannot be delegated      Failed - Urine Drug Screen completed in last 360 days      Failed - Valid encounter within last 6 months    Recent Outpatient Visits   None            Passed - Last BP in normal range    BP Readings from Last 1 Encounters:  03/16/23 126/82         Passed - Last Heart Rate in normal range    Pulse Readings from Last 1 Encounters:  03/16/23 85

## 2023-08-22 ENCOUNTER — Encounter: Payer: Self-pay | Admitting: Family Medicine

## 2023-08-22 ENCOUNTER — Ambulatory Visit (INDEPENDENT_AMBULATORY_CARE_PROVIDER_SITE_OTHER): Admitting: Family Medicine

## 2023-08-22 ENCOUNTER — Other Ambulatory Visit: Payer: Self-pay

## 2023-08-22 VITALS — BP 105/65 | HR 56 | Resp 16 | Wt 170.4 lb

## 2023-08-22 DIAGNOSIS — R41840 Attention and concentration deficit: Secondary | ICD-10-CM

## 2023-08-22 DIAGNOSIS — N952 Postmenopausal atrophic vaginitis: Secondary | ICD-10-CM | POA: Diagnosis not present

## 2023-08-22 MED ORDER — PREMARIN 0.625 MG/GM VA CREA
1.0000 | TOPICAL_CREAM | VAGINAL | 2 refills | Status: DC
Start: 2023-08-22 — End: 2023-11-21
  Filled 2023-08-22: qty 30, 35d supply, fill #0
  Filled 2023-09-21: qty 30, 35d supply, fill #1

## 2023-08-22 MED ORDER — AMPHETAMINE-DEXTROAMPHET ER 30 MG PO CP24
30.0000 mg | ORAL_CAPSULE | ORAL | 0 refills | Status: DC
Start: 2023-08-22 — End: 2023-11-08
  Filled 2023-08-22: qty 30, 30d supply, fill #0

## 2023-08-22 NOTE — Patient Instructions (Signed)
 Marland Kitchen  Please review the attached list of medications and notify my office if there are any errors.   . Please bring all of your medications to every appointment so we can make sure that our medication list is the same as yours.

## 2023-08-31 NOTE — Progress Notes (Signed)
 Established patient visit   Patient: Krystal Woodward   DOB: 19-Oct-1955   68 y.o. Female  MRN: [id number] Visit Date: 08/22/2023  Today's healthcare provider: Jeralene Mom, MD   Chief Complaint  Patient presents with   Medical Management of Chronic Issues    ADD   Subjective    HPI  Discussed the use of AI scribe software for clinical note transcription with the patient, who gave verbal consent to proceed.  History of Present Illness   Krystal Woodward is a 68 year old female who presents for a follow-up on her medications.  She is currently taking Adderall  for ADHD and finds it somewhat effective. She feels an increase to 30 mg may be beneficial, as suggested by her son, who is on the same dose. Her son, studying nursing, has noticed a positive difference in her focus when she is on medication. She is attempting to maintain a schedule to aid her focus and productivity. No trouble sleeping, chest pains, heart flutter, or shortness of breath.  She experiences significant vaginal dryness and itching, particularly during intercourse, which she attributes to dryness. She has used a gel recommended previously, which was helpful, and sometimes uses Vaseline, which she finds effective. No discharge or symptoms of a yeast infection. She is concerned about using hormone treatments due to potential side effects.  She mentions intermittent pain on her side that has persisted for about two years. A previous sonogram did not reveal any issues, and a colonoscopy four years ago was clear.       Medications: Outpatient Medications Prior to Visit  Medication Sig   acetaminophen  (TYLENOL ) 500 MG tablet Take 1,000 mg by mouth every 8 (eight) hours as needed (pain).   aspirin 81 MG tablet Take 81 mg by mouth daily.   Cholecalciferol 125 MCG (5000 UT) capsule Take 5,000 Units by mouth daily.   ibuprofen (ADVIL) 200 MG tablet Take 600-800 mg by mouth every 8 (eight) hours as needed (pain).   KRILL  OIL PO Take by mouth.   Multiple Vitamins-Minerals (CENTRUM SILVER ULTRA WOMENS PO) Take 1 capsule by mouth daily.   TURMERIC CURCUMIN PO Take 1 tablet by mouth daily.   amphetamine -dextroamphetamine  (ADDERALL  XR) 20 MG 24 hr capsule Take 1 capsule (20 mg total) by mouth daily.   influenza vaccine adjuvanted (FLUAD) 0.5 ML injection Inject into the muscle.   [DISCONTINUED] terbinafine  (LAMISIL ) 250 MG tablet Take 1 tablet (250 mg total) by mouth daily.   No facility-administered medications prior to visit.    Review of Systems     Objective    BP 105/65 (BP Location: Left Arm, Patient Position: Sitting, Cuff Size: Normal)   Pulse (!) 56   Resp 16   Wt 170 lb 6.4 oz (77.3 kg)   SpO2 100%   BMI 31.17 kg/m    Physical Exam   General appearance: Obese female, cooperative and in no acute distress Head: Normocephalic, without obvious abnormality, atraumatic Respiratory: Respirations even and unlabored, normal respiratory rate Extremities: All extremities are intact.  Skin: Skin color, texture, turgor normal. No rashes seen  Psych: Appropriate mood and affect. Neurologic: Mental status: Alert, oriented to person, place, and time, thought content appropriate.    Assessment & Plan     1. Attention or concentration deficit (Primary) Moderately improved on 20mg  Adderall , but not to goal. Will increase to  amphetamine -dextroamphetamine  (ADDERALL  XR) 30 MG 24 hr capsule; Take 1 capsule (30 mg total) by mouth every morning.  Dispense: 30 capsule; Refill: 0  2. Atrophic vaginitis Try prescription  conjugated estrogens  (PREMARIN ) vaginal cream; Place 1 Application vaginally 3 (three) times a week as directed  Dispense: 30 g; Refill: 2   Return in about 3 months (around 11/21/2023) for ADD and  prediabetes.         Jeralene Mom, MD  [facility] [phone] (phone) [phone] (fax)  [facility]

## 2023-09-21 ENCOUNTER — Other Ambulatory Visit: Payer: Self-pay

## 2023-11-08 ENCOUNTER — Telehealth: Payer: Self-pay

## 2023-11-08 ENCOUNTER — Ambulatory Visit: Payer: Self-pay

## 2023-11-08 ENCOUNTER — Other Ambulatory Visit: Payer: Self-pay | Admitting: Family Medicine

## 2023-11-08 DIAGNOSIS — Z1231 Encounter for screening mammogram for malignant neoplasm of breast: Secondary | ICD-10-CM

## 2023-11-08 DIAGNOSIS — R41840 Attention and concentration deficit: Secondary | ICD-10-CM

## 2023-11-08 NOTE — Telephone Encounter (Signed)
 FYI Only or Action Required?: FYI only for provider.  Patient was last seen in primary care on 08/22/2023 by Krystal Nancyann BRAVO, MD.  Called Nurse Triage reporting Toe Injury.  Symptoms began several weeks ago.  Interventions attempted: Nothing.  Symptoms are: unchanged.  Triage Disposition: No disposition on file.  Patient/caregiver understands and will follow disposition?:      Copied from CRM [id number]. Topic: Clinical - Red Word Triage >> Nov 08, 2023  9:51 AM [name] wrote: Red Word that prompted transfer to Nurse Triage: Pt is calling for an appt stating that she stumped her big toe a few weeks ago and it is still hurting and getting worse. She says there is some swelling around the nail and it is black and blue. Transferring to NT Reason for Disposition  Large swelling or bruise  Answer Assessment - Initial Assessment Questions 1. MECHANISM: How did the injury happen?      Walking and slammed foot into side walk 2. ONSET: When did the injury happen? (e.g., minutes, hours ago)      10/20/22 3. LOCATION: What part of the toe is injured? Is the nail damaged?      Left foot big toe 4. APPEARANCE of TOE INJURY: What does the injury look like?      Swelling and nail is black and blue 5. SEVERITY: Can you use the foot normally? Can you walk?      yes 6. SIZE: For cuts, bruises, or swelling, ask: How large is it? (e.g., inches or centimeters;  entire toe)      no 7. PAIN: Is there pain? If Yes, ask: How bad is the pain?   What does it keep you from doing? (Scale 0-10; or none, mild, moderate, severe)     mild 8. TETANUS: For any breaks in the skin, ask: When was your last tetanus booster?     na 9. DIABETES: Do you have a history of diabetes or poor circulation in the feet?     Pre DM 10. OTHER SYMPTOMS: Do you have any other symptoms?        no 11. PREGNANCY: Is there any chance you are pregnant? When was your last menstrual period?        na  Protocols used: Toe Injury-A-AH

## 2023-11-08 NOTE — Telephone Encounter (Signed)
 She can call or send a mychart message a week before appointment and will place orders at that time.

## 2023-11-08 NOTE — Telephone Encounter (Signed)
 Copied from CRM [id number]. Topic: Clinical - Request for Lab/Test Order >> Nov 08, 2023  9:58 AM Krystal Woodward wrote: Reason for CRM: Pt is coming in on 12/19/2023 for a physical and is requesting to have her labs done at that time or before. She mentions having her A1C checked

## 2023-11-08 NOTE — Telephone Encounter (Signed)
 Copied from CRM [id number]. Topic: Clinical - Medication Refill >> Nov 08, 2023  9:49 AM [name] wrote: Medication: amphetamine -dextroamphetamine  (ADDERALL  XR) 30 MG 24 hr capsule   Has the patient contacted their pharmacy? No (Agent: If no, request that the patient contact the pharmacy for the refill. If patient does not wish to contact the pharmacy document the reason why and proceed with request.) (Agent: If yes, when and what did the pharmacy advise?)  This is the patient's preferred pharmacy:  [facility] [address] [place] [address] Phone: [phone] Fax: [phone]  Is this the correct pharmacy for this prescription? Yes If no, delete pharmacy and type the correct one.   Has the prescription been filled recently? No  Is the patient out of the medication? Yes  Has the patient been seen for an appointment in the last year OR does the patient have an upcoming appointment? Yes  Can we respond through MyChart? Yes  Agent: Please be advised that Rx refills may take up to 3 business days. We ask that you follow-up with your pharmacy.

## 2023-11-09 ENCOUNTER — Other Ambulatory Visit: Payer: Self-pay

## 2023-11-09 ENCOUNTER — Encounter: Payer: Self-pay | Admitting: Family Medicine

## 2023-11-09 ENCOUNTER — Ambulatory Visit: Admitting: Family Medicine

## 2023-11-09 VITALS — BP 110/68 | HR 53 | Resp 16 | Ht 62.0 in | Wt 170.0 lb

## 2023-11-09 DIAGNOSIS — S99922A Unspecified injury of left foot, initial encounter: Secondary | ICD-10-CM | POA: Diagnosis not present

## 2023-11-09 DIAGNOSIS — M79675 Pain in left toe(s): Secondary | ICD-10-CM | POA: Diagnosis not present

## 2023-11-09 DIAGNOSIS — S99929A Unspecified injury of unspecified foot, initial encounter: Secondary | ICD-10-CM

## 2023-11-09 NOTE — Patient Instructions (Signed)
 You can do soaks with dial soap or hibiclens  to help keep the tissues of the nail matrix and cuticle soft, keep things draining and healing and prevent infection.  You may also put a swipe of antibiotic ointment over the cuticle for a few days.  Follow up with podiatry  Right now you do not look like you need oral antibiotics or any procedures.   The bruising around the base of the toenail should improve gradually over the next couple weeks.  Discoloration or bruising under the nail may show up and grow out slowly over months and both would be expected after this kind of injury.

## 2023-11-09 NOTE — Progress Notes (Signed)
 Patient ID: Sagan Wurzel, female    DOB: [birth year], 68 y.o.   MRN: [id number]  PCP: Gasper Nancyann BRAVO, MD  Chief Complaint  Patient presents with   Toe Pain    L big toe, hit toe 10/20/23. Had on face toenails that jammed into cuticle.    Subjective:   Kenlynn Houde is a 68 y.o. female, presents to clinic with CC of the following:  HPI  Here for toe pain and nail discoloration since injury 2-3 weeks ago Discussed the use of AI scribe software for clinical note transcription with the patient, who gave verbal consent to proceed.  History of Present Illness Jeryl Wilbourn is a 68 year old female who presents with a left great toe and toenail injury.  Left great toe injury - On October 20, 2023, jammed left great toe into a curb - Pain localized around the cuticle - Bleeding around the cuticle at the time of injury - Swelling and tenderness present around the cuticle - No pain in the toenail itself - Wearing fake toenails at the time, which were pushed into the cuticle  Onychomycosis and nail changes - History of toenail fungus resulting in a thickened left great toenail - Previously treated by a podiatrist - Concern regarding the appearance of the toenail as it grows out  Infection concerns - Concern about potential infection of the left great toe   Patient Active Problem List   Diagnosis Date Noted   Wears hearing aid 12/04/2022   Chronic constipation 01/16/2022   Hepatic steatosis 08/05/2021   Attention deficit disorder (ADD) 07/11/2021   Right shoulder pain 08/04/2020   Bilateral cataracts 01/20/2018   Post-menopausal 01/14/2016   Pre-diabetes 01/06/2015   Bilateral knee pain 01/04/2015   Overweight 01/04/2015   Hyperprolactinemia (HCC) 01/04/2015   Fatigue 01/04/2015   Family history of diabetes mellitus 01/04/2015   Family history of colon cancer 01/04/2015   MURMUR 06/30/2010      Current Outpatient Medications:    acetaminophen  (TYLENOL ) 500 MG  tablet, Take 1,000 mg by mouth every 8 (eight) hours as needed (pain)., Disp: , Rfl:    amphetamine -dextroamphetamine  (ADDERALL  XR) 30 MG 24 hr capsule, Take 1 capsule (30 mg total) by mouth every morning., Disp: 30 capsule, Rfl: 0   aspirin 81 MG tablet, Take 81 mg by mouth daily., Disp: , Rfl:    Cholecalciferol 125 MCG (5000 UT) capsule, Take 5,000 Units by mouth daily., Disp: , Rfl:    conjugated estrogens  (PREMARIN ) vaginal cream, Place 1 Application vaginally 3 (three) times a week as directed, Disp: 30 g, Rfl: 2   ibuprofen (ADVIL) 200 MG tablet, Take 600-800 mg by mouth every 8 (eight) hours as needed (pain)., Disp: , Rfl:    influenza vaccine adjuvanted (FLUAD ) 0.5 ML injection, Inject into the muscle., Disp: 0.5 mL, Rfl: 0   KRILL OIL PO, Take by mouth., Disp: , Rfl:    Multiple Vitamins-Minerals (CENTRUM SILVER ULTRA WOMENS PO), Take 1 capsule by mouth daily., Disp: , Rfl:    TURMERIC CURCUMIN PO, Take 1 tablet by mouth daily., Disp: , Rfl:    Allergies  Allergen Reactions   Amitiza  [Lubiprostone ] Nausea Only     Social History   Tobacco Use   Smoking status: Never   Smokeless tobacco: Never  Vaping Use   Vaping status: Never Used  Substance Use Topics   Alcohol use: No   Drug use: No      Chart Review Today: I personally reviewed  active problem list, medication list, allergies, family history, social history, health maintenance, notes from last encounter, lab results, imaging with the patient/caregiver today.   Review of Systems  Constitutional: Negative.   HENT: Negative.    Eyes: Negative.   Respiratory: Negative.    Cardiovascular: Negative.   Gastrointestinal: Negative.   Endocrine: Negative.   Genitourinary: Negative.   Musculoskeletal: Negative.   Skin: Negative.   Allergic/Immunologic: Negative.   Neurological: Negative.   Hematological: Negative.   Psychiatric/Behavioral: Negative.    All other systems reviewed and are negative.       Objective:   Vitals:   11/09/23 1312  BP: 110/68  Pulse: (!) 53  Resp: 16  SpO2: 100%  Weight: 170 lb (77.1 kg)  Height: 5' 2 (1.575 m)    Body mass index is 31.09 kg/m.  Physical Exam Vitals and nursing note reviewed.  Constitutional:      General: She is not in acute distress.    Appearance: Normal appearance. She is well-developed. She is not ill-appearing, toxic-appearing or diaphoretic.  HENT:     Head: Normocephalic and atraumatic.     Right Ear: External ear normal.     Left Ear: External ear normal.     Nose: Nose normal.  Eyes:     General: No scleral icterus.       Right eye: No discharge.        Left eye: No discharge.     Conjunctiva/sclera: Conjunctivae normal.  Neck:     Trachea: No tracheal deviation.  Cardiovascular:     Rate and Rhythm: Normal rate.     Pulses:          Dorsalis pedis pulses are 2+ on the left side.       Posterior tibial pulses are 2+ on the left side.  Pulmonary:     Effort: Pulmonary effort is normal. No respiratory distress.     Breath sounds: No stridor.  Musculoskeletal:     Left foot: Normal range of motion.  Feet:     Left foot:     Toenail Condition: Left toenails are abnormally thick. Fungal disease present.    Comments: See photo Mild swelling and bruising at proximal nail bed/distal left great toe with mild tenderness, nail thickened and discolored, no nail ttp Skin:    General: Skin is warm and dry.     Findings: No rash.  Neurological:     Mental Status: She is alert.     Motor: No abnormal muscle tone.     Coordination: Coordination normal.     Gait: Gait normal.  Psychiatric:        Mood and Affect: Mood normal.        Behavior: Behavior normal.           Assessment & Plan:     ICD-10-CM   1. Great toe pain, left  M79.675 Ambulatory referral to Podiatry    2. Injury of great toenail  S99.929A Ambulatory referral to Podiatry     Assessment and Plan Assessment & Plan Left great toe and toenail  injury Necrotic toenail with suspected subungual hematoma, no infection, potential nail matrix damage. - Daily soaks with Dial soap or Hibiclens . - Apply antibiotic ointment around nail base for a few days, vaseline after that - Refer to podiatrist for nail matrix evaluation. - Advised nail discoloration may persist for months. - Discussed potential nail abnormalities.  Onychomycosis Thickened toenail due to fungal infection. - Refer to podiatrist  for evaluation and treatment. - Discussed possible removal of diseased nail.  Recording duration: 13 minutes       Michelene Cower, PA-C 11/09/23 1:26 PM

## 2023-11-09 NOTE — Telephone Encounter (Signed)
 Requested medications are due for refill today.  yes  Requested medications are on the active medications list.  yes  Last refill. 08/22/2023 #30 0 rf  Future visit scheduled.   yes  Notes to clinic.  Refill not delegated.    Requested Prescriptions  Pending Prescriptions Disp Refills   amphetamine -dextroamphetamine  (ADDERALL  XR) 30 MG 24 hr capsule 30 capsule 0    Sig: Take 1 capsule (30 mg total) by mouth every morning.     Not Delegated - Psychiatry:  Stimulants/ADHD Failed - 11/09/2023 12:00 PM      Failed - This refill cannot be delegated      Failed - Urine Drug Screen completed in last 360 days      Passed - Last BP in normal range    BP Readings from Last 1 Encounters:  08/22/23 105/65         Passed - Last Heart Rate in normal range    Pulse Readings from Last 1 Encounters:  08/22/23 (!) 56         Passed - Valid encounter within last 6 months    Recent Outpatient Visits           2 months ago Attention or concentration deficit   [facility] Gasper, Nancyann BRAVO, MD

## 2023-11-10 MED ORDER — AMPHETAMINE-DEXTROAMPHET ER 30 MG PO CP24
30.0000 mg | ORAL_CAPSULE | ORAL | 0 refills | Status: DC
  Filled 2023-11-10: qty 30, 30d supply, fill #0

## 2023-11-11 ENCOUNTER — Other Ambulatory Visit: Payer: Self-pay

## 2023-11-13 NOTE — Addendum Note (Signed)
 Addended by: GASPER NANCYANN BRAVO on: 11/13/2023 03:56 PM   Modules accepted: Orders

## 2023-11-13 NOTE — Telephone Encounter (Unsigned)
 Copied from CRM [id number]. Topic: Clinical - Request for Lab/Test Order >> Nov 13, 2023 11:03 AM Ivette P wrote: Reason for CRM: Pt called in to request referral for Mammography, lat one was performed 07/07/2022. Pt would like to know if it is time for her next check    Pt would like a follow up on if has to be done this year [phone]

## 2023-11-21 ENCOUNTER — Encounter: Payer: Self-pay | Admitting: Family Medicine

## 2023-11-21 ENCOUNTER — Ambulatory Visit (INDEPENDENT_AMBULATORY_CARE_PROVIDER_SITE_OTHER): Admitting: Family Medicine

## 2023-11-21 ENCOUNTER — Telehealth: Payer: Self-pay | Admitting: Family Medicine

## 2023-11-21 ENCOUNTER — Other Ambulatory Visit: Payer: Self-pay

## 2023-11-21 VITALS — BP 99/60 | HR 57 | Temp 98.0°F | Ht 62.0 in | Wt 169.4 lb

## 2023-11-21 DIAGNOSIS — M25512 Pain in left shoulder: Secondary | ICD-10-CM

## 2023-11-21 DIAGNOSIS — R41840 Attention and concentration deficit: Secondary | ICD-10-CM | POA: Diagnosis not present

## 2023-11-21 DIAGNOSIS — Z1231 Encounter for screening mammogram for malignant neoplasm of breast: Secondary | ICD-10-CM

## 2023-11-21 DIAGNOSIS — N952 Postmenopausal atrophic vaginitis: Secondary | ICD-10-CM

## 2023-11-21 DIAGNOSIS — R7303 Prediabetes: Secondary | ICD-10-CM | POA: Diagnosis not present

## 2023-11-21 LAB — POCT GLYCOSYLATED HEMOGLOBIN (HGB A1C): Hemoglobin A1C: 5.8 % — AB (ref 4.0–5.6)

## 2023-11-21 MED ORDER — PREMARIN 0.625 MG/GM VA CREA
1.0000 | TOPICAL_CREAM | VAGINAL | 5 refills | Status: AC
  Filled 2023-11-21: qty 30, 35d supply, fill #0

## 2023-11-21 NOTE — Addendum Note (Signed)
 Addended by: CHERRY CHIQUITA HERO on: 11/21/2023 03:10 PM   Modules accepted: Orders

## 2023-11-21 NOTE — Progress Notes (Signed)
 Established patient visit   Patient: Krystal Woodward   DOB: 06-Jun-1955   68 y.o. Female  MRN: [id number] Visit Date: 11/21/2023  Today's healthcare provider: Nancyann Perry, MD   Chief Complaint  Patient presents with   Medical Management of Chronic Issues    ADD, Prediabetes   Arm Pain    Pain from neck to shoulder muscle, no pain with movement. Hurts when she lays on it, and when carrying heavy objects. Onset 1 month    Toe Injury    Left toe injury, hit it on concrete. Nail is lifting on the side. Happened 6/28   Subjective    Discussed the use of AI scribe software for clinical note transcription with the patient, who gave verbal consent to proceed.  History of Present Illness   Krystal Woodward is a 68 year old female who presents for a follow-up on ADD and prediabetes.  She has an injury to her toenail, which she cleans daily. The nail is painful and partially detached, but she is unable to remove it completely.  She describes new shoulder pain in her left arm that has been present for about a month. The pain occurs with certain movements and affects her sleep. This is the arm on which she previously had surgery, and she tries to avoid carrying heavy grocery bags to prevent aggravation.  Regarding her Adderall  use, it is effective when taken daily, although she occasionally forgets to take it if it is not in its usual place. No side effects such as heart flutters or racing heart, and she reports sleeping well when she goes to bed early.  Her A1c is improved to 5.8, which she attributes to recent dietary indulgences at a family reunion, although she maintains an exercise routine.       Medications: Outpatient Medications Prior to Visit  Medication Sig   acetaminophen  (TYLENOL ) 500 MG tablet Take 1,000 mg by mouth every 8 (eight) hours as needed (pain).   amphetamine -dextroamphetamine  (ADDERALL  XR) 30 MG 24 hr capsule Take 1 capsule (30 mg total) by mouth every morning.    aspirin 81 MG tablet Take 81 mg by mouth daily.   Cholecalciferol 125 MCG (5000 UT) capsule Take 5,000 Units by mouth daily.   ibuprofen (ADVIL) 200 MG tablet Take 600-800 mg by mouth every 8 (eight) hours as needed (pain).   influenza vaccine adjuvanted (FLUAD ) 0.5 ML injection Inject into the muscle.   KRILL OIL PO Take by mouth.   Multiple Vitamins-Minerals (CENTRUM SILVER ULTRA WOMENS PO) Take 1 capsule by mouth daily.   TURMERIC CURCUMIN PO Take 1 tablet by mouth daily.   conjugated estrogens  (PREMARIN ) vaginal cream Place 1 Application vaginally 3 (three) times a week as directed   No facility-administered medications prior to visit.   Review of Systems  Constitutional:  Negative for appetite change, chills, fatigue and fever.  Respiratory:  Negative for chest tightness and shortness of breath.   Cardiovascular:  Negative for chest pain and palpitations.  Gastrointestinal:  Negative for abdominal pain, nausea and vomiting.  Neurological:  Negative for dizziness and weakness.       Objective    BP 99/60 (BP Location: Left Arm, Patient Position: Sitting, Cuff Size: Normal)   Pulse (!) 57   Temp 98 F (36.7 C) (Oral)   Ht 5' 2 (1.575 m)   Wt 169 lb 6.4 oz (76.8 kg)   SpO2 100%   BMI 30.98 kg/m   Physical Exam  General appearance: Mildly obese female, cooperative and in no acute distress Head: Normocephalic, without obvious abnormality, atraumatic Respiratory: Respirations even and unlabored, normal respiratory rate Extremities: All extremities are intact. FROM left shoulder and c-spine. Mild tenderness along left paracervical muscle and left upper traps. No gross deformities.  Skin: Skin color, texture, turgor normal. No rashes seen  Psych: Appropriate mood and affect. Neurologic: Mental status: Alert, oriented to person, place, and time, thought content appropriate.   Results for orders placed or performed in visit on 11/21/23  POCT glycosylated hemoglobin (Hb A1C)   Result Value Ref Range   Hemoglobin A1C 5.8 (A) 4.0 - 5.6 %     Assessment & Plan    1. Pre-diabetes (Primary) Doing well with diet and regular exercise.   2. Attention or concentration deficit Doing well on current dose of Adderall .   3. Atrophic vaginitis Refill  conjugated estrogens  (PREMARIN ) vaginal cream; Place 1 Application vaginally 3 (three) times a week as directed  Dispense: 30 g; Refill: 5  4. Acute pain of left shoulder Onset about a month ago and slowly improving. Likely paracervical strain, maybe some underlying cervical OA. Recommend BID application of heat pain. Consider PT referral if not continue to improve over the next 2-3 weeks.        Nancyann Perry, MD  [facility] [phone] (phone) [phone] (fax)  [facility]

## 2023-11-21 NOTE — Telephone Encounter (Signed)
 Patient needs order for a mammogram to be placed for [facility] in [place].  Please send patient a message in  [place] notifying her when the order is done so she can call and schedule appt.

## 2023-11-30 ENCOUNTER — Ambulatory Visit: Admitting: Family Medicine

## 2023-12-19 ENCOUNTER — Encounter: Payer: Self-pay | Admitting: Family Medicine

## 2023-12-19 ENCOUNTER — Ambulatory Visit (INDEPENDENT_AMBULATORY_CARE_PROVIDER_SITE_OTHER): Admitting: Family Medicine

## 2023-12-19 ENCOUNTER — Other Ambulatory Visit: Payer: Self-pay

## 2023-12-19 ENCOUNTER — Other Ambulatory Visit (HOSPITAL_COMMUNITY)
Admission: RE | Admit: 2023-12-19 | Discharge: 2023-12-19 | Disposition: A | Discharge status: Home / Self Care | Source: Ambulatory Visit | Attending: Family Medicine | Admitting: Family Medicine

## 2023-12-19 VITALS — BP 123/63 | HR 54 | Temp 98.4°F | Ht 62.0 in | Wt 171.9 lb

## 2023-12-19 DIAGNOSIS — Z1231 Encounter for screening mammogram for malignant neoplasm of breast: Secondary | ICD-10-CM

## 2023-12-19 DIAGNOSIS — Z0000 Encounter for general adult medical examination without abnormal findings: Secondary | ICD-10-CM

## 2023-12-19 DIAGNOSIS — E2839 Other primary ovarian failure: Secondary | ICD-10-CM

## 2023-12-19 DIAGNOSIS — Z780 Asymptomatic menopausal state: Secondary | ICD-10-CM

## 2023-12-19 DIAGNOSIS — Z124 Encounter for screening for malignant neoplasm of cervix: Secondary | ICD-10-CM | POA: Insufficient documentation

## 2023-12-19 DIAGNOSIS — R7303 Prediabetes: Secondary | ICD-10-CM | POA: Diagnosis not present

## 2023-12-19 DIAGNOSIS — E221 Hyperprolactinemia: Secondary | ICD-10-CM

## 2023-12-19 MED ORDER — MEDROXYPROGESTERONE ACETATE 2.5 MG PO TABS
2.5000 mg | ORAL_TABLET | Freq: Every day | ORAL | 12 refills | Status: AC
  Filled 2023-12-19: qty 90, 90d supply, fill #0

## 2023-12-20 ENCOUNTER — Ambulatory Visit: Payer: Self-pay | Admitting: Family Medicine

## 2023-12-20 LAB — HEMOGLOBIN A1C
Est. average glucose Bld gHb Est-mCnc: 117 mg/dL
Hgb A1c MFr Bld: 5.7 % — ABNORMAL HIGH (ref 4.8–5.6)

## 2023-12-20 LAB — CBC
Hematocrit: 39 % (ref 34.0–46.6)
Hemoglobin: 12.4 g/dL (ref 11.1–15.9)
MCH: 31.1 pg (ref 26.6–33.0)
MCHC: 31.8 g/dL (ref 31.5–35.7)
MCV: 98 fL — ABNORMAL HIGH (ref 79–97)
Platelets: 253 x10E3/uL (ref 150–450)
RBC: 3.99 x10E6/uL (ref 3.77–5.28)
RDW: 12.4 % (ref 11.7–15.4)
WBC: 7.5 x10E3/uL (ref 3.4–10.8)

## 2023-12-20 LAB — LIPID PANEL
Chol/HDL Ratio: 3.1 ratio (ref 0.0–4.4)
Cholesterol, Total: 155 mg/dL (ref 100–199)
HDL: 50 mg/dL (ref >39)
LDL Chol Calc (NIH): 92 mg/dL (ref 0–99)
Triglycerides: 66 mg/dL (ref 0–149)
VLDL Cholesterol Cal: 13 mg/dL (ref 5–40)

## 2023-12-20 LAB — COMPREHENSIVE METABOLIC PANEL WITH GFR
ALT: 16 IU/L (ref 0–32)
AST: 21 IU/L (ref 0–40)
Albumin: 4.2 g/dL (ref 3.9–4.9)
Alkaline Phosphatase: 150 IU/L — ABNORMAL HIGH (ref 44–121)
BUN/Creatinine Ratio: 22 (ref 12–28)
BUN: 16 mg/dL (ref 8–27)
Bilirubin Total: 0.2 mg/dL (ref 0.0–1.2)
CO2: 22 mmol/L (ref 20–29)
Calcium: 9.4 mg/dL (ref 8.7–10.3)
Chloride: 106 mmol/L (ref 96–106)
Creatinine, Ser: 0.72 mg/dL (ref 0.57–1.00)
Globulin, Total: 3 g/dL (ref 1.5–4.5)
Glucose: 91 mg/dL (ref 70–99)
Potassium: 4.1 mmol/L (ref 3.5–5.2)
Sodium: 141 mmol/L (ref 134–144)
Total Protein: 7.2 g/dL (ref 6.0–8.5)
eGFR: 92 mL/min/1.73 (ref >59)

## 2023-12-21 LAB — CYTOLOGY - PAP
Comment: NEGATIVE
Diagnosis: NEGATIVE
High risk HPV: NEGATIVE

## 2023-12-24 NOTE — Progress Notes (Signed)
 Complete physical exam   Patient: Krystal Woodward   DOB: 16-Aug-1955   68 y.o. Female  MRN: [id number] Visit Date: 12/19/2023  Today's healthcare provider: Nancyann Perry, MD   Chief Complaint  Patient presents with   Annual Exam    Diet- General Exercise- Lift weights, walk 6 miles a day, stair stepper for 45 mins Overall Feeling- Good Sleep- Fine, sometimes left arm hurts in shoulder Concerns- Pain in left side that comes and goes, sometimes it is sharper than other times  Pap- today  Dexa Scan- discuss with provider    Subjective    Discussed the use of AI scribe software for clinical note transcription with the patient, who gave verbal consent to proceed.  History of Present Illness   Krystal Woodward is a 68 year old female who presents for an annual physical exam.  She uses estrogen cream three times a week which has been effective. She has not had a hysterectomy and has not experienced any discharge or bleeding.  She is due for a Pap test and has never had any problems or abnormalities with Pap smears in the past.  She is due for a mammogram and plans to schedule it at the [facility] location. She performs regular self-examinations and has not noticed any lumps or abnormalities.  She is also due for a bone density test, which she recalls having done many years ago.         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   Breast mass 01/24/2011   Chest pain    Elevated liver enzymes    GERD (gastroesophageal reflux disease)    h/o   Hyperprolactinemia (HCC) 8000;7997   Normal MRI   Vitamin D  deficiency    Past Surgical History:  Procedure Laterality Date   BREAST BIOPSY Right 06/17/15   Inflammed Fibrocyst   BREAST CYST EXCISION  02/27/2011   Procedure: CYST EXCISION BREAST;  Surgeon: Sherlean JINNY Laughter, MD;  Location: [facility];  Service: General;  Laterality: Left;  Needle localization removal left breast  mass   BREAST EXCISIONAL BIOPSY Left    BUNIONECTOMY     both feet   COLONOSCOPY WITH PROPOFOL  N/A 09/10/2019   Procedure: COLONOSCOPY WITH PROPOFOL ;  Surgeon: Toledo, Ladell POUR, MD;  Location: [facility];  Service: Gastroenterology;  Laterality: N/A;   INCISION AND DRAINAGE ABSCESS Right 07/28/2015   Procedure: INCISION AND DRAINAGE RIGHT BREAST  ABSCESS;  Surgeon: Donnice Lima, MD;  Location: [facility];  Service: General;  Laterality: Right;   SHOULDER ARTHROSCOPY WITH ROTATOR CUFF REPAIR AND OPEN BICEPS TENODESIS  10/10/2018   Procedure: SHOULDER ARTHROSCOPY WITH OPEN ROTATOR CUFF REPAIR AND MINI OPEN BICEPS TENODESIS;  Surgeon: Edie Norleen JINNY, MD;  Location: [facility];  Service: Orthopedics;;   SHOULDER ARTHROSCOPY WITH SUBACROMIAL DECOMPRESSION  10/10/2018   Procedure: SHOULDER ARTHROSCOPY WITH SUBACROMIAL DECOMPRESSION;  Surgeon: Edie Norleen JINNY, MD;  Location: [facility];  Service: Orthopedics;;   TUBAL LIGATION  1996   Social History   Socioeconomic History   Marital status: Married    Spouse name: Not on file   Number of children: 4   Years of education: Not on file   Highest education level: Not on file  Occupational History   Occupation: CNA     Comment: [employer]  Tobacco Use   Smoking status: Never   Smokeless tobacco: Never  Vaping Use  Vaping status: Never Used  Substance and Sexual Activity   Alcohol use: No   Drug use: No   Sexual activity: Not on file  Other Topics Concern   Not on file  Social History Narrative   Not on file   Social Drivers of Health   Financial Resource Strain: Not on file  Food Insecurity: Not on file  Transportation Needs: Not on file  Physical Activity: Not on file  Stress: Not on file  Social Connections: Not on file  Intimate Partner Violence: Not on file   Family Status  Relation Name Status   Mother  Deceased at age 69   Father  Deceased at age 26's   Sister  Deceased   Sister  Alive   Sister  Alive   PGM   (Not Specified)   Neg Hx  (Not Specified)  No partnership data on file   Family History  Problem Relation Age of Onset   Cancer Mother        lymph nodes   Transient ischemic attack Mother    Diabetes Father    Cancer Sister 68       2015 Rectal cancer, spread to Lymph node, lung cancer, non-smoker   Dementia Sister 38   Dementia Sister    Asthma Paternal Grandmother    Diabetes Paternal Grandmother    Breast cancer Neg Hx    Allergies  Allergen Reactions   Amitiza  [Lubiprostone ] Nausea Only    Patient Care Team: Gasper Nancyann BRAVO, MD as PCP - General (Family Medicine) Poggi, Norleen PARAS, MD as Consulting Physician (Surgery)   Medications: Outpatient Medications Prior to Visit  Medication Sig   acetaminophen  (TYLENOL ) 500 MG tablet Take 1,000 mg by mouth every 8 (eight) hours as needed (pain).   amphetamine -dextroamphetamine  (ADDERALL  XR) 30 MG 24 hr capsule Take 1 capsule (30 mg total) by mouth every morning.   aspirin 81 MG tablet Take 81 mg by mouth daily.   Cholecalciferol 125 MCG (5000 UT) capsule Take 5,000 Units by mouth daily.   conjugated estrogens  (PREMARIN ) vaginal cream Place 1 Application vaginally 3 (three) times a week as directed   ibuprofen (ADVIL) 200 MG tablet Take 600-800 mg by mouth every 8 (eight) hours as needed (pain).   KRILL OIL PO Take by mouth.   Multiple Vitamins-Minerals (CENTRUM SILVER ULTRA WOMENS PO) Take 1 capsule by mouth daily.   TURMERIC CURCUMIN PO Take 1 tablet by mouth daily.   [DISCONTINUED] influenza vaccine adjuvanted (FLUAD ) 0.5 ML injection Inject into the muscle.   No facility-administered medications prior to visit.    Review of Systems  Constitutional:  Negative for appetite change, chills, fatigue and fever.  Respiratory:  Negative for chest tightness and shortness of breath.   Cardiovascular:  Negative for chest pain and palpitations.  Gastrointestinal:  Negative for abdominal pain, nausea and vomiting.  Neurological:   Negative for dizziness and weakness.      Objective    BP 123/63 (BP Location: Left Arm, Patient Position: Sitting, Cuff Size: Normal)   Pulse (!) 54   Temp 98.4 F (36.9 C) (Oral)   Ht 5' 2 (1.575 m)   Wt 171 lb 14.4 oz (78 kg)   SpO2 98%   BMI 31.44 kg/m    Physical Exam  General Appearance:    Overweight female. Alert, cooperative, in no acute distress, appears stated age   Head:    Normocephalic, without obvious abnormality, atraumatic  Eyes:    PERRL, conjunctiva/corneas clear,  EOM's intact, fundi    benign, both eyes  Ears:    Normal TM's and external ear canals, both ears  Nose:   Nares normal, septum midline, mucosa normal, no drainage    or sinus tenderness  Throat:   Lips, mucosa, and tongue normal; teeth and gums normal  Neck:   Supple, symmetrical, trachea midline, no adenopathy;    thyroid :  no enlargement/tenderness/nodules; no carotid   bruit or JVD  Back:     Symmetric, no curvature, ROM normal, no CVA tenderness  Lungs:     Clear to auscultation bilaterally, respirations unlabored  Chest Wall:    No tenderness or deformity   Heart:    Bradycardic. Normal rhythm.  2/6 systolic murmur at right upper sternal border  Breast Exam:    deferred  Abdomen:     Soft, non-tender, bowel sounds active all four quadrants,    no masses, no organomegaly  Pelvic:    cervix normal in appearance, external genitalia normal, no adnexal masses or tenderness, rectovaginal septum normal, urethra without abnormality or discharge, and vagina normal without discharge  Extremities:   All extremities are intact. No cyanosis or edema  Pulses:   2+ and symmetric all extremities  Skin:   Skin color, texture, turgor normal, no rashes or lesions  Lymph nodes:   Cervical, supraclavicular, and axillary nodes normal  Neurologic:   CNII-XII intact, normal strength, sensation and reflexes    throughout        Last depression screening scores    11/21/2023   11:06 AM 08/22/2023   10:15 AM  03/16/2023   11:17 AM  PHQ 2/9 Scores  PHQ - 2 Score 0 0 0  PHQ- 9 Score 0  0   Last fall risk screening    11/21/2023   11:06 AM  Fall Risk   Falls in the past year? 1  Number falls in past yr: 0  Injury with Fall? 0  Follow up Falls evaluation completed   Last Audit-C alcohol use screening    02/17/2022    1:45 PM  Alcohol Use Disorder Test (AUDIT)  1. How often do you have a drink containing alcohol? 0  2. How many drinks containing alcohol do you have on a typical day when you are drinking? 0  3. How often do you have six or more drinks on one occasion? 0  AUDIT-C Score 0   A score of 3 or more in women, and 4 or more in men indicates increased risk for alcohol abuse, EXCEPT if all of the points are from question 1   Results for orders placed or performed in visit on 12/19/23  CBC  Result Value Ref Range   WBC 7.5 3.4 - 10.8 x10E3/uL   RBC 3.99 3.77 - 5.28 x10E6/uL   Hemoglobin 12.4 11.1 - 15.9 g/dL   Hematocrit 60.9 65.9 - 46.6 %   MCV 98 (H) 79 - 97 fL   MCH 31.1 26.6 - 33.0 pg   MCHC 31.8 31.5 - 35.7 g/dL   RDW 87.5 88.2 - 84.5 %   Platelets 253 150 - 450 x10E3/uL  Comprehensive metabolic panel with GFR  Result Value Ref Range   Glucose 91 70 - 99 mg/dL   BUN 16 8 - 27 mg/dL   Creatinine, Ser 9.27 0.57 - 1.00 mg/dL   eGFR 92 >40 fO/fpw/8.26   BUN/Creatinine Ratio 22 12 - 28   Sodium 141 134 - 144 mmol/L   Potassium 4.1 3.5 -  5.2 mmol/L   Chloride 106 96 - 106 mmol/L   CO2 22 20 - 29 mmol/L   Calcium 9.4 8.7 - 10.3 mg/dL   Total Protein 7.2 6.0 - 8.5 g/dL   Albumin 4.2 3.9 - 4.9 g/dL   Globulin, Total 3.0 1.5 - 4.5 g/dL   Bilirubin Total 0.2 0.0 - 1.2 mg/dL   Alkaline Phosphatase 150 (H) 44 - 121 IU/L   AST 21 0 - 40 IU/L   ALT 16 0 - 32 IU/L  Lipid panel  Result Value Ref Range   Cholesterol, Total 155 100 - 199 mg/dL   Triglycerides 66 0 - 149 mg/dL   HDL 50 >60 mg/dL   VLDL Cholesterol Cal 13 5 - 40 mg/dL   LDL Chol Calc (NIH) 92 0 - 99 mg/dL    Chol/HDL Ratio 3.1 0.0 - 4.4 ratio  Hemoglobin A1c  Result Value Ref Range   Hgb A1c MFr Bld 5.7 (H) 4.8 - 5.6 %   Est. average glucose Bld gHb Est-mCnc 117 mg/dL  Cytology - HPV w/PAP any interpr. 16/18 genotyping if + HPV (reflex) ([facility])  Result Value Ref Range   High risk HPV Negative    Adequacy      Satisfactory for evaluation; transformation zone component PRESENT.   Diagnosis      - Negative for intraepithelial lesion or malignancy (NILM)   Comment Normal Reference Range HPV - Negative     Assessment & Plan    Routine Health Maintenance and Physical Exam  Exercise Activities and Dietary recommendations  Goals   None     Immunization History  Administered Date(s) Administered   Fluad  Trivalent(High Dose 65+) 01/24/2023   Influenza,inj,Quad PF,6+ Mos 01/16/2017, 01/18/2018, 12/31/2018, 01/10/2021, 01/13/2022   PFIZER(Purple Top)SARS-COV-2 Vaccination 07/28/2019, 08/19/2019   PNEUMOCOCCAL CONJUGATE-20 12/04/2022   Pfizer(Comirnaty)Fall Seasonal Vaccine 12 years and older 05/15/2022   Tdap 01/28/2010   Zoster Recombinant(Shingrix ) 05/15/2022, 07/19/2022    Health Maintenance  Topic Date Due   DEXA SCAN  Never done   INFLUENZA VACCINE  11/23/2023   COVID-19 Vaccine (5 - 2025-26 season) 12/24/2023   DTaP/Tdap/Td (2 - Td or Tdap) 11/08/2024 (Originally 01/29/2020)   MAMMOGRAM  07/06/2024   Colonoscopy  09/09/2024   Pneumococcal Vaccine: 50+ Years  Completed   Hepatitis C Screening  Completed   Zoster Vaccines- Shingrix   Completed   HPV VACCINES  Aged Out   Meningococcal B Vaccine  Aged Out    Discussed health benefits of physical activity, and encouraged her to engage in regular exercise appropriate for her age and condition.     Adult Wellness Visit Routine wellness visit for a 68 year old woman. Discussed Pap test continuation due to her preference and estrogen cream use. - Perform Pap test today. - Order mammogram. - Order bone density  test.  Chronic left shoulder pain Chronic left shoulder pain, likely muscular, with pain on certain movements.  Chronic intermittent left-sided pain Chronic intermittent left-sided pain, sharper in nature. Previous discussions about specialist referral.  Use of topical estrogen therapy Uses estrogen cream thrice weekly. Discussed estrogen therapy risks, including uterine cancer. Explained progesterone can mitigate risks without affecting menopausal symptoms. - Prescribe low-dose progesterone tablets.    medroxyPROGESTERone  (PROVERA ) 2.5 MG tablet; Take 1 tablet (2.5 mg total) by mouth daily.  Dispense: 90 tablet; Refill: 12  Pre-diabetes  - CBC - Comprehensive metabolic panel with GFR - Lipid panel - Hemoglobin A1c  - Cytology - HPV w/PAP any interpr. 16/18 genotyping if +  HPV (reflex) ([facility])  Estrogen deficiency  - DG Bone Density; Future       Nancyann Perry, MD  [facility] [phone] (phone) [phone] (fax)  [facility]

## 2023-12-31 ENCOUNTER — Other Ambulatory Visit: Payer: Self-pay

## 2024-02-19 ENCOUNTER — Ambulatory Visit (INDEPENDENT_AMBULATORY_CARE_PROVIDER_SITE_OTHER)

## 2024-02-19 DIAGNOSIS — Z23 Encounter for immunization: Secondary | ICD-10-CM | POA: Diagnosis not present

## 2024-02-25 ENCOUNTER — Other Ambulatory Visit: Payer: Self-pay | Admitting: Family Medicine

## 2024-02-25 DIAGNOSIS — R41840 Attention and concentration deficit: Secondary | ICD-10-CM

## 2024-02-25 NOTE — Telephone Encounter (Unsigned)
 Copied from CRM #[id number]. Topic: Clinical - Medication Refill >> Feb 25, 2024  2:21 PM Jasmin G wrote: Medication: amphetamine -dextroamphetamine  (ADDERALL  XR) 30 MG 24 hr capsule  Has the patient contacted their pharmacy? Yes (Agent: If no, request that the patient contact the pharmacy for the refill. If patient does not wish to contact the pharmacy document the reason why and proceed with request.) (Agent: If yes, when and what did the pharmacy advise?)  This is the patient's preferred pharmacy:  [facility] [address] [place] [address] Phone: [phone] Fax: [phone]  Is this the correct pharmacy for this prescription? Yes If no, delete pharmacy and type the correct one.   Has the prescription been filled recently? Yes  Is the patient out of the medication? Yes  Has the patient been seen for an appointment in the last year OR does the patient have an upcoming appointment? Yes  Can we respond through MyChart? Yes  Agent: Please be advised that Rx refills may take up to 3 business days. We ask that you follow-up with your pharmacy.

## 2024-02-26 ENCOUNTER — Other Ambulatory Visit: Payer: Self-pay

## 2024-02-26 ENCOUNTER — Other Ambulatory Visit: Payer: Self-pay | Admitting: Family Medicine

## 2024-02-26 DIAGNOSIS — R41840 Attention and concentration deficit: Secondary | ICD-10-CM

## 2024-02-26 NOTE — Telephone Encounter (Signed)
 Medication requested 02/26/24, will send that encounter to the office.

## 2024-02-26 NOTE — Telephone Encounter (Deleted)
 Requested medication (s) are due for refill today: Yes  Requested medication (s) are on the active medication list: Yes  Last refill:  11/10/23  Future visit scheduled: No  Notes to clinic:  Unable to refill per protocol, cannot delegate. Patient is out of medication x 2 days.     Requested Prescriptions  Pending Prescriptions Disp Refills   amphetamine -dextroamphetamine  (ADDERALL  XR) 30 MG 24 hr capsule 30 capsule 0    Sig: Take 1 capsule (30 mg total) by mouth every morning.     Not Delegated - Psychiatry:  Stimulants/ADHD Failed - 02/26/2024 10:46 AM      Failed - This refill cannot be delegated      Failed - Urine Drug Screen completed in last 360 days      Passed - Last BP in normal range    BP Readings from Last 1 Encounters:  12/19/23 123/63         Passed - Last Heart Rate in normal range    Pulse Readings from Last 1 Encounters:  12/19/23 (!) 54         Passed - Valid encounter within last 6 months    Recent Outpatient Visits           2 months ago Annual physical exam   [facility] Gasper Nancyann BRAVO, MD   3 months ago Pre-diabetes   [facility] Gasper Nancyann BRAVO, MD   3 months ago Great toe pain, left   [facility] Leavy Mole, PA-C   6 months ago Attention or concentration deficit   [facility] Gasper, Nancyann BRAVO, MD

## 2024-02-26 NOTE — Telephone Encounter (Unsigned)
 Copied from CRM #[id number]. Topic: Clinical - Medication Refill >> Feb 26, 2024 10:37 AM Montie POUR wrote: Kendalyn has been out of the above medication since 02/24/24 and she was calling back to see if it was sent to her pharmacy. I let her know that it is in the process per chart.

## 2024-02-26 NOTE — Telephone Encounter (Signed)
 Requested medication (s) are due for refill today: Yes  Requested medication (s) are on the active medication list: Yes  Last refill:  11/10/23  Future visit scheduled: No  Notes to clinic:  Unable to refill per protocol, cannot delegate. Patient is out of medication x 2 days.      Requested Prescriptions  Pending Prescriptions Disp Refills   amphetamine -dextroamphetamine  (ADDERALL  XR) 30 MG 24 hr capsule 30 capsule 0    Sig: Take 1 capsule (30 mg total) by mouth every morning.     Not Delegated - Psychiatry:  Stimulants/ADHD Failed - 02/26/2024 10:50 AM      Failed - This refill cannot be delegated      Failed - Urine Drug Screen completed in last 360 days      Passed - Last BP in normal range    BP Readings from Last 1 Encounters:  12/19/23 123/63         Passed - Last Heart Rate in normal range    Pulse Readings from Last 1 Encounters:  12/19/23 (!) 54         Passed - Valid encounter within last 6 months    Recent Outpatient Visits           2 months ago Annual physical exam   [facility] Gasper Nancyann BRAVO, MD   3 months ago Pre-diabetes   [facility] Gasper Nancyann BRAVO, MD   3 months ago Great toe pain, left   [facility] Leavy Mole, PA-C   6 months ago Attention or concentration deficit   [facility] Gasper, Nancyann BRAVO, MD

## 2024-02-26 NOTE — Telephone Encounter (Deleted)
 Copied from CRM #[id number]. Topic: Clinical - Medication Refill >> Feb 26, 2024 10:37 AM Montie POUR wrote: Krystal Woodward has been out of the above medication since 02/24/24 and she was calling back to see if it was sent to her pharmacy. I let her know that it is in the process per chart.

## 2024-02-27 ENCOUNTER — Other Ambulatory Visit: Payer: Self-pay

## 2024-02-27 MED ORDER — AMPHETAMINE-DEXTROAMPHET ER 30 MG PO CP24
30.0000 mg | ORAL_CAPSULE | ORAL | 0 refills | Status: DC
  Filled 2024-02-27: qty 30, 30d supply, fill #0

## 2024-04-02 ENCOUNTER — Other Ambulatory Visit: Payer: Self-pay | Admitting: Family Medicine

## 2024-04-02 DIAGNOSIS — R41840 Attention and concentration deficit: Secondary | ICD-10-CM

## 2024-04-02 NOTE — Telephone Encounter (Unsigned)
 Copied from CRM #[id number]. Topic: Clinical - Medication Refill >> Apr 02, 2024  4:59 PM Wess S wrote: Medication: amphetamine -dextroamphetamine  (ADDERALL  XR) 30 MG 24 hr capsule   Has the patient contacted their pharmacy? Yes (Agent: If no, request that the patient contact the pharmacy for the refill. If patient does not wish to contact the pharmacy document the reason why and proceed with request.) (Agent: If yes, when and what did the pharmacy advise?)  This is the patient's preferred pharmacy:  [facility] [address] [place] [address] Phone: [phone] Fax: [phone]  Is this the correct pharmacy for this prescription? Yes If no, delete pharmacy and type the correct one.   Has the prescription been filled recently? Yes  Is the patient out of the medication? No  Has the patient been seen for an appointment in the last year OR does the patient have an upcoming appointment? Yes  Can we respond through MyChart? Yes  Agent: Please be advised that Rx refills may take up to 3 business days. We ask that you follow-up with your pharmacy.

## 2024-04-04 NOTE — Telephone Encounter (Signed)
 Requested medication (s) are due for refill today: yes  Requested medication (s) are on the active medication list: yes  Last refill:  02/27/24  Future visit scheduled: no  Notes to clinic:  Unable to refill per protocol, cannot delegate.      Requested Prescriptions  Pending Prescriptions Disp Refills   amphetamine -dextroamphetamine  (ADDERALL  XR) 30 MG 24 hr capsule 30 capsule 0    Sig: Take 1 capsule (30 mg total) by mouth every morning.     Not Delegated - Psychiatry:  Stimulants/ADHD Failed - 04/04/2024 12:30 PM      Failed - This refill cannot be delegated      Failed - Urine Drug Screen completed in last 360 days      Passed - Last BP in normal range    BP Readings from Last 1 Encounters:  12/19/23 123/63         Passed - Last Heart Rate in normal range    Pulse Readings from Last 1 Encounters:  12/19/23 (!) 54         Passed - Valid encounter within last 6 months    Recent Outpatient Visits           3 months ago Annual physical exam   [facility] Gasper Nancyann BRAVO, MD   4 months ago Pre-diabetes   [facility] Gasper Nancyann BRAVO, MD   4 months ago Great toe pain, left   [facility] Leavy Mole, PA-C   7 months ago Attention or concentration deficit   [facility] Gasper, Nancyann BRAVO, MD

## 2024-04-07 ENCOUNTER — Other Ambulatory Visit: Payer: Self-pay

## 2024-04-07 MED ORDER — AMPHETAMINE-DEXTROAMPHET ER 30 MG PO CP24
30.0000 mg | ORAL_CAPSULE | ORAL | 0 refills | Status: DC
  Filled 2024-04-07: qty 30, 30d supply, fill #0

## 2024-05-19 ENCOUNTER — Other Ambulatory Visit: Payer: Self-pay | Admitting: Family Medicine

## 2024-05-19 DIAGNOSIS — R41840 Attention and concentration deficit: Secondary | ICD-10-CM

## 2024-05-19 NOTE — Telephone Encounter (Signed)
 Copied from CRM #[id number]. Topic: Clinical - Medication Refill >> May 19, 2024 10:56 AM Larissa S wrote: Medication: amphetamine -dextroamphetamine  (ADDERALL  XR) 30 MG 24 hr capsule  Has the patient contacted their pharmacy? Yes (Agent: If no, request that the patient contact the pharmacy for the refill. If patient does not wish to contact the pharmacy document the reason why and proceed with request.) (Agent: If yes, when and what did the pharmacy advise?)  This is the patient's preferred pharmacy:  [facility] - [facility] [address] [place] [address] Phone: [phone] Fax: [phone]  Is this the correct pharmacy for this prescription? Yes If no, delete pharmacy and type the correct one.   Has the prescription been filled recently? No  Is the patient out of the medication? Yes  Has the patient been seen for an appointment in the last year OR does the patient have an upcoming appointment? Yes  Can we respond through MyChart? Yes  Agent: Please be advised that Rx refills may take up to 3 business days. We ask that you follow-up with your pharmacy.

## 2024-05-20 ENCOUNTER — Other Ambulatory Visit: Payer: Self-pay

## 2024-05-20 MED ORDER — AMPHETAMINE-DEXTROAMPHET ER 30 MG PO CP24
30.0000 mg | ORAL_CAPSULE | ORAL | 0 refills | Status: AC
  Filled 2024-05-20: qty 30, 30d supply, fill #0

## 2024-05-20 NOTE — Telephone Encounter (Signed)
 Requested medication (s) are due for refill today - yes  Requested medication (s) are on the active medication list -yes  Future visit scheduled -no  Last refill: 04/07/24 #30  Notes to clinic: non delegated Rx  Requested Prescriptions  Pending Prescriptions Disp Refills   amphetamine -dextroamphetamine  (ADDERALL  XR) 30 MG 24 hr capsule 30 capsule 0    Sig: Take 1 capsule (30 mg total) by mouth every morning.     Not Delegated - Psychiatry:  Stimulants/ADHD Failed - 05/20/2024  1:42 PM      Failed - This refill cannot be delegated      Failed - Urine Drug Screen completed in last 360 days      Passed - Last BP in normal range    BP Readings from Last 1 Encounters:  12/19/23 123/63         Passed - Last Heart Rate in normal range    Pulse Readings from Last 1 Encounters:  12/19/23 (!) 54         Passed - Valid encounter within last 6 months    Recent Outpatient Visits           5 months ago Annual physical exam   [facility] Gas[facility] Nancyann BRAVO, MD   6 months ago Pre-diabetes   [facility] Gasper Nancyann BRAVO, MD   6 months ago Great toe pain, left   [facility] Lacey, [place], [place]   9 months ago Attention or concentration deficit   [facility] Gasper Nancyann BRAVO, MD                 Requested Prescriptions  Pending Prescriptions Disp Refills   amphetamine -dextroamphetamine  (ADDERALL  XR) 30 MG 24 hr capsule 30 capsule 0    Sig: Take 1 capsule (30 mg total) by mouth every morning.     Not Delegated - Psychiatry:  Stimulants/ADHD Failed - 05/20/2024  1:42 PM      Failed - This refill cannot be delegated      Failed - Urine Drug Screen completed in last 360 days      Passed - Last BP in normal range    BP Readings from Last 1 Encounters:  12/19/23 123/63         Passed - Last Heart Rate in normal range    Pulse Readings from Last 1 Encounters:  12/19/23 (!)  54         Passed - Valid encounter within last 6 months    Recent Outpatient Visits           5 months ago Annual physical exam   [facility] Gasper Nancyann BRAVO, MD   6 months ago Pre-diabetes   [facility] Gasper Nancyann BRAVO, MD   6 months ago Great toe pain, left   [facility] Leavy Michelene, [place]   9 months ago Attention or concentration deficit   [facility] Gasper, Nancyann BRAVO, MD

## 2024-05-21 ENCOUNTER — Other Ambulatory Visit: Payer: Self-pay

## 2024-05-22 ENCOUNTER — Other Ambulatory Visit: Payer: Self-pay
